# Patient Record
Sex: Male | Born: 1940 | Race: White | Hispanic: No | Smoking: Never smoker
Health system: Southern US, Community
[De-identification: ages and names within clinical notes are randomized; demographics above are authoritative.]

## PROBLEM LIST (undated history)

## (undated) DIAGNOSIS — I10 Essential (primary) hypertension: Secondary | ICD-10-CM

## (undated) DIAGNOSIS — W44F3XA Food entering into or through a natural orifice, initial encounter: Secondary | ICD-10-CM

## (undated) DIAGNOSIS — K259 Gastric ulcer, unspecified as acute or chronic, without hemorrhage or perforation: Secondary | ICD-10-CM

## (undated) DIAGNOSIS — T18128A Food in esophagus causing other injury, initial encounter: Secondary | ICD-10-CM

## (undated) DIAGNOSIS — M1990 Unspecified osteoarthritis, unspecified site: Secondary | ICD-10-CM

## (undated) HISTORY — DX: Gastric ulcer, unspecified as acute or chronic, without hemorrhage or perforation: K25.9

## (undated) HISTORY — DX: Food entering into or through a natural orifice, initial encounter: W44.F3XA

## (undated) HISTORY — PX: TOTAL HIP ARTHROPLASTY: SHX124

## (undated) HISTORY — PX: HERNIA REPAIR: SHX51

## (undated) HISTORY — DX: Food in esophagus causing other injury, initial encounter: T18.128A

---

## 2008-07-31 ENCOUNTER — Ambulatory Visit (HOSPITAL_COMMUNITY): Admission: RE | Admit: 2008-07-31 | Discharge: 2008-07-31 | Payer: Self-pay | Admitting: General Surgery

## 2010-09-08 LAB — CBC
HCT: 44.2 % (ref 39.0–52.0)
MCHC: 34.7 g/dL (ref 30.0–36.0)
MCV: 92.9 fL (ref 78.0–100.0)
Platelets: 238 10*3/uL (ref 150–400)
WBC: 6.2 10*3/uL (ref 4.0–10.5)

## 2010-09-08 LAB — COMPREHENSIVE METABOLIC PANEL
AST: 30 U/L (ref 0–37)
Albumin: 3.9 g/dL (ref 3.5–5.2)
BUN: 8 mg/dL (ref 6–23)
Calcium: 8.8 mg/dL (ref 8.4–10.5)
Chloride: 109 mEq/L (ref 96–112)
Creatinine, Ser: 1.01 mg/dL (ref 0.4–1.5)
GFR calc Af Amer: >60 mL/min (ref >60)
GFR calc non Af Amer: >60 mL/min (ref >60)
Total Bilirubin: 1.2 mg/dL (ref 0.3–1.2)

## 2010-09-08 LAB — DIFFERENTIAL
Basophils Absolute: 0 10*3/uL (ref 0.0–0.1)
Lymphocytes Relative: 26 % (ref 12–46)
Lymphs Abs: 1.6 10*3/uL (ref 0.7–4.0)
Monocytes Absolute: 0.4 10*3/uL (ref 0.1–1.0)
Neutro Abs: 4.1 10*3/uL (ref 1.7–7.7)

## 2010-10-11 NOTE — Op Note (Signed)
Mario Huerta, Mario Huerta               ACCOUNT NO.:  [id number]   MEDICAL RECORD NO.:  [id number]          PATIENT TYPE:  AMB   LOCATION:  DAY                          FACILITY:  [facility]   PHYSICIAN:  Juanetta Gosling, MDDATE OF BIRTH:  1941/05/16   DATE OF PROCEDURE:  07/31/2008  DATE OF DISCHARGE:                               OPERATIVE REPORT   PREOPERATIVE DIAGNOSIS:  Chronically incarcerated left inguinal hernia.   POSTOPERATIVE DIAGNOSIS:  Chronically incarcerated left inguinal hernia.   PROCEDURE:  Left inguinal repair with extended Prolene system mesh.   SURGEON:  Harden Mo, M.D.   ASSISTANT:  None.   ANESTHESIA:  General.   FINDINGS:  Chronically incarcerated scrotal left indirect hernia.   SPECIMENS:  None.   BLOOD LOSS:  50 mL.   COMPLICATIONS:  None.   DRAINS:  None.   DISPOSITION:  To recovery room in stable condition.   INDICATIONS:  Mr. Vondrak is a 59-year male with about a 6-year history  of an enlarging  left inguinal hernia with what he says are 2 episodes  where he has been reduced.  One of these is associated with vomiting.  It sounded like he had an obstruction at that time. I saw him in the  office and he had a very large chronically incarcerated left groin  hernia.  He and I discussed an open left inguinal hernia repair with  mesh.   PROCEDURE:  After informed consent was obtained, the patient was first  administered 1 gram of intravenous cefazolin.  He was then taken to the  operating room.  He was placed under general anesthesia without  complication.  Sequential compression devices were placed on his lower  extremities prior to operation.  His left groin was then prepped and  draped in a standard sterile surgical fashion.  A surgical time-out was  then performed.  About a 6 cm left groin incision was then made overlying where the very  large bulge that he had that was present.  This was dissected down to  the level of the external  oblique.  A superficial epigastric vein was  noted and this was ligated with 2-0 Vicryl suture.  The external  abdominal oblique was identified and incised with a knife.  This was  then entered through the external inguinal ring.  The external oblique  was then grasped with clamps and the cord was attempted to be  identified.  He had a very large chronically incarcerated hernia.  It  took about 45 minutes of work to separate this hernia sac from the  surrounding cord structures.  His vas deferens and remaining cord  structures were identified and preserved throughout the operation.  The  hernia sac was eventually freed from the scrotum as well as from his  remaining cord structures and this was able to be reduced back into his  abdomen.  I did open his hernia sac and there was bile present at the  proximal portion of the sac. I had ligated the portion overlying that.  Following this, this  was then reduced into the abdomen.  A sponge was  then placed in the preperitoneal space.  The floor was then was then  examined.  This was noted to be weak, but there was no direct hernia  that was present.  The external abdominal oblique was lifted laterally  to make space for the mesh.  The preperitoneal space was developed with  a [employer] sponge and extended Prolene hernia system mesh was then used.  The mesh was then placed into the preperitoneal space and the bottom  portion of the bilayer was laid flat.  Following this, I did close his  internal ring with a figure-of-eight 2-0 Vicryl suture to allow the mesh  to sit better.  The top portion of the bilayer was then laid flat.  A T  cut was made in the mesh and wrapped around the spermatic cord.  This  was then tacked together as well as to the inguinal ligament into  position.  A further 2-0 Prolene stitch was also placed to the inguinal  ligament inferiorly.  The 2-0 Prolene stitch was placed to the pubic  tubercle as well as to the internal  oblique superiorly.  The mesh was  then laid flat in the lateral position and the mesh appeared to lie in  good position.  There was some oozing from some muscle that I used  electrocautery to stop.  The entire cord were the dissection was needed  had various places that were oozing.  Hemostasis was obtained in all of  these positions, but there was a very large dissection space and  I did  lose approximately 50 mL of blood total with this operation due to the  chronicity of the sac.  Following this, I then closed the external  abdominal oblique with a 2-0 Vicryl, Scarpa's was closed with a 3-0  Vicryl, and the skin was closed with a 4-0 Monocryl in a subcuticular  fashion; 10 mL of quarter percent Marcaine was then infiltrated into the  wound as well as performing an ilioinguinal nerve block.  Steri-Strips  and sterile dressing were placed over his wound.  His left testicle was  present in his scrotum after the completion of the operation.  He was  extubated in the operating room having tolerated this well and  transferred to the recovery room.      Juanetta Gosling, MD  Electronically Signed     MCW/MEDQ  D:  07/31/2008  T:  07/31/2008  Job:  [id number]   cc:   Lucila Maine

## 2014-06-29 DIAGNOSIS — Z96641 Presence of right artificial hip joint: Secondary | ICD-10-CM | POA: Insufficient documentation

## 2015-10-01 DIAGNOSIS — Z9181 History of falling: Secondary | ICD-10-CM | POA: Diagnosis not present

## 2015-10-01 DIAGNOSIS — Z96641 Presence of right artificial hip joint: Secondary | ICD-10-CM | POA: Diagnosis not present

## 2015-10-01 DIAGNOSIS — Z1389 Encounter for screening for other disorder: Secondary | ICD-10-CM | POA: Diagnosis not present

## 2015-10-01 DIAGNOSIS — E782 Mixed hyperlipidemia: Secondary | ICD-10-CM | POA: Diagnosis not present

## 2015-10-01 DIAGNOSIS — I10 Essential (primary) hypertension: Secondary | ICD-10-CM | POA: Diagnosis not present

## 2015-10-01 DIAGNOSIS — Z79899 Other long term (current) drug therapy: Secondary | ICD-10-CM | POA: Diagnosis not present

## 2015-10-27 DIAGNOSIS — S335XXA Sprain of ligaments of lumbar spine, initial encounter: Secondary | ICD-10-CM | POA: Diagnosis not present

## 2015-10-29 DIAGNOSIS — Z885 Allergy status to narcotic agent status: Secondary | ICD-10-CM | POA: Diagnosis not present

## 2015-10-29 DIAGNOSIS — Z79899 Other long term (current) drug therapy: Secondary | ICD-10-CM | POA: Diagnosis not present

## 2015-10-29 DIAGNOSIS — M5137 Other intervertebral disc degeneration, lumbosacral region: Secondary | ICD-10-CM | POA: Diagnosis not present

## 2015-10-29 DIAGNOSIS — Z888 Allergy status to other drugs, medicaments and biological substances status: Secondary | ICD-10-CM | POA: Diagnosis not present

## 2015-10-29 DIAGNOSIS — G8929 Other chronic pain: Secondary | ICD-10-CM | POA: Diagnosis not present

## 2015-10-29 DIAGNOSIS — I10 Essential (primary) hypertension: Secondary | ICD-10-CM | POA: Diagnosis not present

## 2015-10-29 DIAGNOSIS — M545 Low back pain: Secondary | ICD-10-CM | POA: Diagnosis not present

## 2015-10-29 DIAGNOSIS — Z96641 Presence of right artificial hip joint: Secondary | ICD-10-CM | POA: Diagnosis not present

## 2015-10-29 DIAGNOSIS — M438X6 Other specified deforming dorsopathies, lumbar region: Secondary | ICD-10-CM | POA: Diagnosis not present

## 2015-10-29 DIAGNOSIS — M4317 Spondylolisthesis, lumbosacral region: Secondary | ICD-10-CM | POA: Diagnosis not present

## 2015-10-29 DIAGNOSIS — M5136 Other intervertebral disc degeneration, lumbar region: Secondary | ICD-10-CM | POA: Diagnosis not present

## 2016-12-28 DIAGNOSIS — Z1389 Encounter for screening for other disorder: Secondary | ICD-10-CM | POA: Diagnosis not present

## 2016-12-28 DIAGNOSIS — Z6828 Body mass index (BMI) 28.0-28.9, adult: Secondary | ICD-10-CM | POA: Diagnosis not present

## 2016-12-28 DIAGNOSIS — N529 Male erectile dysfunction, unspecified: Secondary | ICD-10-CM | POA: Diagnosis not present

## 2016-12-28 DIAGNOSIS — Z9181 History of falling: Secondary | ICD-10-CM | POA: Diagnosis not present

## 2017-05-29 HISTORY — PX: MOUTH SURGERY: SHX715

## 2018-01-22 DIAGNOSIS — Z0000 Encounter for general adult medical examination without abnormal findings: Secondary | ICD-10-CM | POA: Diagnosis not present

## 2018-01-22 DIAGNOSIS — E782 Mixed hyperlipidemia: Secondary | ICD-10-CM | POA: Diagnosis not present

## 2018-01-22 DIAGNOSIS — M48061 Spinal stenosis, lumbar region without neurogenic claudication: Secondary | ICD-10-CM | POA: Diagnosis not present

## 2018-01-22 DIAGNOSIS — M4716 Other spondylosis with myelopathy, lumbar region: Secondary | ICD-10-CM | POA: Diagnosis not present

## 2018-01-31 ENCOUNTER — Encounter (HOSPITAL_COMMUNITY)
Admission: EM | Disposition: A | Discharge status: Home / Self Care | Payer: Self-pay | Source: Home / Self Care | Attending: Emergency Medicine

## 2018-01-31 ENCOUNTER — Emergency Department (HOSPITAL_COMMUNITY): Payer: Medicare Other | Admitting: Certified Registered Nurse Anesthetist

## 2018-01-31 ENCOUNTER — Encounter (HOSPITAL_COMMUNITY): Payer: Self-pay | Admitting: Emergency Medicine

## 2018-01-31 ENCOUNTER — Ambulatory Visit (HOSPITAL_COMMUNITY)
Admission: EM | Admit: 2018-01-31 | Discharge: 2018-01-31 | Disposition: A | Discharge status: Home / Self Care | Payer: Medicare Other | Attending: Emergency Medicine | Admitting: Emergency Medicine

## 2018-01-31 ENCOUNTER — Other Ambulatory Visit: Payer: Self-pay

## 2018-01-31 ENCOUNTER — Emergency Department (HOSPITAL_COMMUNITY): Payer: Medicare Other

## 2018-01-31 DIAGNOSIS — Z79899 Other long term (current) drug therapy: Secondary | ICD-10-CM | POA: Diagnosis not present

## 2018-01-31 DIAGNOSIS — R1111 Vomiting without nausea: Secondary | ICD-10-CM

## 2018-01-31 DIAGNOSIS — X58XXXA Exposure to other specified factors, initial encounter: Secondary | ICD-10-CM | POA: Diagnosis not present

## 2018-01-31 DIAGNOSIS — I10 Essential (primary) hypertension: Secondary | ICD-10-CM | POA: Insufficient documentation

## 2018-01-31 DIAGNOSIS — Y92009 Unspecified place in unspecified non-institutional (private) residence as the place of occurrence of the external cause: Secondary | ICD-10-CM | POA: Insufficient documentation

## 2018-01-31 DIAGNOSIS — K222 Esophageal obstruction: Secondary | ICD-10-CM

## 2018-01-31 DIAGNOSIS — R1310 Dysphagia, unspecified: Secondary | ICD-10-CM | POA: Insufficient documentation

## 2018-01-31 DIAGNOSIS — T18128A Food in esophagus causing other injury, initial encounter: Secondary | ICD-10-CM | POA: Diagnosis not present

## 2018-01-31 DIAGNOSIS — T18120A Food in esophagus causing compression of trachea, initial encounter: Secondary | ICD-10-CM | POA: Diagnosis not present

## 2018-01-31 DIAGNOSIS — K2210 Ulcer of esophagus without bleeding: Secondary | ICD-10-CM | POA: Insufficient documentation

## 2018-01-31 DIAGNOSIS — K208 Other esophagitis: Secondary | ICD-10-CM | POA: Diagnosis not present

## 2018-01-31 HISTORY — PX: BIOPSY: SHX5522

## 2018-01-31 HISTORY — PX: FOREIGN BODY REMOVAL: SHX962

## 2018-01-31 HISTORY — DX: Unspecified osteoarthritis, unspecified site: M19.90

## 2018-01-31 HISTORY — DX: Essential (primary) hypertension: I10

## 2018-01-31 HISTORY — PX: ESOPHAGOGASTRODUODENOSCOPY: SHX5428

## 2018-01-31 LAB — CBC WITH DIFFERENTIAL/PLATELET
Abs Immature Granulocytes: 0 10*3/uL (ref 0.0–0.1)
Basophils Absolute: 0.1 10*3/uL (ref 0.0–0.1)
Basophils Relative: 1 %
Eosinophils Absolute: 0.1 10*3/uL (ref 0.0–0.7)
Eosinophils Relative: 2 %
HCT: 44.3 % (ref 39.0–52.0)
Hemoglobin: 15.3 g/dL (ref 13.0–17.0)
Immature Granulocytes: 0 %
Lymphocytes Relative: 24 %
Lymphs Abs: 1.5 10*3/uL (ref 0.7–4.0)
MCH: 32 pg (ref 26.0–34.0)
MCHC: 34.5 g/dL (ref 30.0–36.0)
MCV: 92.7 fL (ref 78.0–100.0)
Monocytes Absolute: 0.5 10*3/uL (ref 0.1–1.0)
Monocytes Relative: 9 %
Neutro Abs: 3.9 10*3/uL (ref 1.7–7.7)
Neutrophils Relative %: 64 %
Platelets: 217 10*3/uL (ref 150–400)
RBC: 4.78 MIL/uL (ref 4.22–5.81)
RDW: 13.1 % (ref 11.5–15.5)
WBC: 6.1 10*3/uL (ref 4.0–10.5)

## 2018-01-31 LAB — COMPREHENSIVE METABOLIC PANEL
ALT: 24 U/L (ref 0–44)
AST: 39 U/L (ref 15–41)
Albumin: 3.7 g/dL (ref 3.5–5.0)
Alkaline Phosphatase: 83 U/L (ref 38–126)
Anion gap: 14 (ref 5–15)
BUN: 17 mg/dL (ref 8–23)
CO2: 22 mmol/L (ref 22–32)
Calcium: 9.1 mg/dL (ref 8.9–10.3)
Chloride: 107 mmol/L (ref 98–111)
Creatinine, Ser: 0.98 mg/dL (ref 0.61–1.24)
GFR calc Af Amer: >60 mL/min (ref >60)
GFR calc non Af Amer: >60 mL/min (ref >60)
Glucose, Bld: 98 mg/dL (ref 70–99)
Potassium: 3.7 mmol/L (ref 3.5–5.1)
Sodium: 143 mmol/L (ref 135–145)
Total Bilirubin: 1.3 mg/dL — ABNORMAL HIGH (ref 0.3–1.2)
Total Protein: 6.5 g/dL (ref 6.5–8.1)

## 2018-01-31 SURGERY — ESOPHAGOGASTRODUODENOSCOPY (EGD) WITH PROPOFOL
Anesthesia: Monitor Anesthesia Care

## 2018-01-31 SURGERY — EGD (ESOPHAGOGASTRODUODENOSCOPY)
Anesthesia: General

## 2018-01-31 MED ORDER — SUCRALFATE 1 GM/10ML PO SUSP: 1.0000 g | Freq: Two times a day (BID) | ORAL | 1 refills | Status: DC

## 2018-01-31 MED ORDER — LIDOCAINE 2% (20 MG/ML) 5 ML SYRINGE
INTRAMUSCULAR | Status: DC | PRN
  Administered 2018-01-31: 50 mg via INTRAVENOUS

## 2018-01-31 MED ORDER — GLUCAGON HCL RDNA (DIAGNOSTIC) 1 MG IJ SOLR
2.0000 mg | Freq: Once | INTRAMUSCULAR | Status: AC
  Administered 2018-01-31: 2 mg via INTRAVENOUS
  Filled 2018-01-31: qty 2

## 2018-01-31 MED ORDER — ONDANSETRON HCL 4 MG/2ML IJ SOLN
INTRAMUSCULAR | Status: DC | PRN
  Administered 2018-01-31: 4 mg via INTRAVENOUS

## 2018-01-31 MED ORDER — LACTATED RINGERS IV SOLN
INTRAVENOUS | Status: DC
  Administered 2018-01-31: 16:00:00 via INTRAVENOUS

## 2018-01-31 MED ORDER — PROPOFOL 10 MG/ML IV BOLUS
INTRAVENOUS | Status: DC | PRN
  Administered 2018-01-31: 130 mg via INTRAVENOUS

## 2018-01-31 MED ORDER — FENTANYL CITRATE (PF) 100 MCG/2ML IJ SOLN
INTRAMUSCULAR | Status: AC
  Filled 2018-01-31: qty 4

## 2018-01-31 MED ORDER — SUCCINYLCHOLINE CHLORIDE 200 MG/10ML IV SOSY
PREFILLED_SYRINGE | INTRAVENOUS | Status: DC | PRN
  Administered 2018-01-31: 70 mg via INTRAVENOUS

## 2018-01-31 MED ORDER — PANTOPRAZOLE SODIUM 40 MG PO TBEC: 40.0000 mg | DELAYED_RELEASE_TABLET | Freq: Two times a day (BID) | ORAL | 1 refills | Status: DC

## 2018-01-31 MED ORDER — FENTANYL CITRATE (PF) 100 MCG/2ML IJ SOLN
INTRAMUSCULAR | Status: DC | PRN
  Administered 2018-01-31: 50 ug via INTRAVENOUS

## 2018-01-31 MED ORDER — SODIUM CHLORIDE 0.9 % IV SOLN: INTRAVENOUS | Status: DC

## 2018-01-31 MED ORDER — MIDAZOLAM HCL 5 MG/ML IJ SOLN
INTRAMUSCULAR | Status: AC
  Filled 2018-01-31: qty 3

## 2018-01-31 NOTE — Anesthesia Postprocedure Evaluation (Signed)
Anesthesia Post Note  Patient: Mario Huerta  Procedure(s) Performed: ESOPHAGOGASTRODUODENOSCOPY (EGD) (N/A ) BIOPSY     Patient location during evaluation: PACU Anesthesia Type: General Level of consciousness: awake and alert Pain management: pain level controlled Vital Signs Assessment: post-procedure vital signs reviewed and stable Respiratory status: spontaneous breathing, nonlabored ventilation, respiratory function stable and patient connected to nasal cannula oxygen Cardiovascular status: blood pressure returned to baseline and stable Postop Assessment: no apparent nausea or vomiting Anesthetic complications: no    Last Vitals:  Vitals:   01/31/18 1710 01/31/18 1756  BP: (!) 160/98 (!) 161/100  Pulse: 77 72  Resp: (!) 27 20  Temp:    SpO2: 96% 96%    Last Pain:  Vitals:   01/31/18 1757  TempSrc:   PainSc: 0-No pain                 Effie Berkshire

## 2018-01-31 NOTE — ED Provider Notes (Signed)
[facility] EMERGENCY DEPARTMENT Provider Note   CSN: [id number] Arrival date & time: 01/31/18  1194     History   Chief Complaint Chief Complaint  Patient presents with  . Dysphagia    food bolus    HPI Mario Huerta is a 77 y.o. male with a PMHx significant for Arthritis and HTN who presents for evaluation of a possible food bolus. States he has new dentures and is having a difficult time chewing his food. Last evening he was eating a steak and felt it get stuck on his esophagus. States he has been unable to eat anything since the incident. Is able to tolerate small sips of liquids however cannot tolerate full mouth fulls of liquids without vomiting. Denies chest pain, SOB, abdominal pain, nausea, constipation, diarrhea, fever, chills. States he thinks he vomited his BP meds this morning.    Past Medical History:  Diagnosis Date  . Arthritis   . Hypertension     There are no active problems to display for this patient.   History reviewed. No pertinent surgical history.     Home Medications    Prior to Admission medications   Medication Sig Start Date End Date Taking? Authorizing Provider  amLODipine (NORVASC) 5 MG tablet Take 5 mg by mouth daily. 01/22/18  Yes [provider]  aspirin EC 81 MG tablet Take 81 mg by mouth daily.   Yes [provider]  Glucosamine-Chondroitin-Ca-D3 (TRIPLE FLEX 50+ PO) Take 1 tablet by mouth daily.   Yes [provider]  lisinopril-hydrochlorothiazide (PRINZIDE,ZESTORETIC) 20-12.5 MG tablet Take 1 tablet by mouth daily. 01/22/18  Yes [provider]  Omega-3 Fatty Acids (FISH OIL) 1000 MG CAPS Take 1,000 mg by mouth daily.   Yes [provider]  pantoprazole (PROTONIX) 40 MG tablet Take 1 tablet (40 mg total) by mouth 2 (two) times daily for 28 days. 01/31/18 02/28/18  Ronnette Juniper, MD  sucralfate (CARAFATE) 1 GM/10ML suspension Take 10 mLs (1 g total) by mouth 2 (two) times daily  for 14 days. 01/31/18 02/14/18  Ronnette Juniper, MD    Family History History reviewed. No pertinent family history.  Social History Social History   Tobacco Use  . Smoking status: Never Smoker  Substance Use Topics  . Alcohol use: Yes    Comment: occasional  . Drug use: Never     Allergies   Codeine; Hydrocodone; Oxycodone; and Tramadol   Review of Systems Review of Systems Review of systems negative unless otherwise stated in the HPI.  Physical Exam Updated Vital Signs BP (!) 160/98   Pulse 77   Temp 98.6 F (37 C) (Oral)   Resp (!) 27   Ht 6\' 1"  (1.854 m)   Wt 95.3 kg   SpO2 96%   BMI 27.71 kg/m   Physical Exam  Constitutional: He appears well-developed and well-nourished. No distress.  HENT:  Head: Atraumatic.  Eyes: Pupils are equal, round, and reactive to light.  Neck: Normal range of motion. Neck supple.  Cardiovascular: Normal rate, regular rhythm, normal heart sounds and intact distal pulses. Exam reveals no gallop and no friction rub.  No murmur heard. Pulmonary/Chest: Effort normal and breath sounds normal. No respiratory distress.  Abdominal: Soft. He exhibits no distension.  Musculoskeletal: Normal range of motion.  Neurological: He is alert.  Skin: Skin is warm and dry. He is not diaphoretic.  Psychiatric: He has a normal mood and affect.  Nursing note and vitals reviewed.  ED Treatments / Results  Labs (all labs ordered are listed, but only abnormal results are displayed) Labs Reviewed  COMPREHENSIVE METABOLIC PANEL - Abnormal; Notable for the following components:      Result Value   Total Bilirubin 1.3 (*)    All other components within normal limits  CBC WITH DIFFERENTIAL/PLATELET  SURGICAL PATHOLOGY    EKG None  Radiology Dg Chest 2 View  Result Date: 01/31/2018 CLINICAL DATA:  Pt states he got a piece of steak stuck in his throat last night. Pt states he got new teeth and isnt used to them yet. Pt denies cp and sob. EXAM: CHEST  - 2 VIEW COMPARISON:  Chest x-ray dated 07/31/2008. FINDINGS: Heart size and mediastinal contours are within normal limits. No radiodense foreign body within the mediastinum or upper abdomen. Lungs are clear. Osseous structures about the chest are unremarkable. IMPRESSION: No acute findings. No radiodense foreign body seen within the mediastinum or upper abdomen. Electronically Signed   By: Franki Cabot M.D.   On: 01/31/2018 13:26    Procedures Procedures (including critical care time)  Medications Ordered in ED Medications  glucagon (human recombinant) (GLUCAGEN) injection 2 mg (2 mg Intravenous Given 01/31/18 1329)  17   Initial Impression / Assessment and Plan / ED Course  I have reviewed the triage vital signs and the nursing notes as well as the past medical history.  Pertinent labs & imaging results that were available during my care of the patient were reviewed by me and considered in my medical decision making (see chart for details).  77 year old presents for evaluation of food bolus. Heart and Lungs clear. No signs of respiratory distress. Easy work of breathing. Will obtain labs and chest xray to evaluate. Will give Glugacon IV to attend at dislodging food.  1415: Nursing informed that patient has had multiple episodes of liquid emesis however no food products.   1426: Will consult with GI for evaluation.  Consulted with Dr. Therisa Doyne from [facility] who agrees to evaluate patient.  Dr. Therisa Doyne to take patient to endoscopy lab for EGD. See Note.  1735: Patient return from endoscopy without any immediate complications. Work of breathing non labored, no dysphagia. Without complaints. Able to Tolerate PO intake. Discussed return precaution with patient. Patient to follow-up with [facility] in office. VSS at discharge. Patient and family voiced understanding.    Final Clinical Impressions(s) / ED Diagnoses   Final diagnoses:  Non-intractable vomiting without nausea, unspecified vomiting  type  Esophageal obstruction due to food impaction    ED Discharge Orders         Ordered    pantoprazole (PROTONIX) 40 MG tablet  2 times daily     01/31/18 1703    sucralfate (CARAFATE) 1 GM/10ML suspension  2 times daily     01/31/18 1703           Henderly, Britni A, PA-C 01/31/18 1744    Maudie Flakes, MD 01/31/18 2314

## 2018-01-31 NOTE — Anesthesia Preprocedure Evaluation (Addendum)
Anesthesia Evaluation  Patient identified by MRN, date of birth, ID band Patient awake    Reviewed: Allergy & Precautions, NPO status , Patient's Chart, lab work & pertinent test results  Airway Mallampati: I  TM Distance: >3 FB Neck ROM: Full    Dental  (+) Upper Dentures, Partial Lower, Dental Advisory Given   Pulmonary neg pulmonary ROS,    breath sounds clear to auscultation       Cardiovascular hypertension, Pt. on medications  Rhythm:Regular Rate:Normal     Neuro/Psych negative neurological ROS     GI/Hepatic negative GI ROS, Neg liver ROS,   Endo/Other  negative endocrine ROS  Renal/GU negative Renal ROS     Musculoskeletal   Abdominal Normal abdominal exam  (+)   Peds  Hematology negative hematology ROS (+)   Anesthesia Other Findings   Reproductive/Obstetrics                            Anesthesia Physical Anesthesia Plan  ASA: II  Anesthesia Plan: General   Post-op Pain Management:    Induction: Intravenous  PONV Risk Score and Plan: 2 and Ondansetron and Treatment may vary due to age or medical condition  Airway Management Planned: Oral ETT  Additional Equipment: None  Intra-op Plan:   Post-operative Plan: Extubation in OR  Informed Consent: I have reviewed the patients History and Physical, chart, labs and discussed the procedure including the risks, benefits and alternatives for the proposed anesthesia with the patient or authorized representative who has indicated his/her understanding and acceptance.   Dental advisory given  Plan Discussed with: CRNA  Anesthesia Plan Comments:        Anesthesia Quick Evaluation

## 2018-01-31 NOTE — ED Triage Notes (Addendum)
Pt states he was eating steak last night and now feels like a piece is stuck, was unable to keep meds down this morning-vomited after taking them, [facility]. resp e/u, nad. Previous hx of similar event but pts family member states he was able to get food down in the past without intervention, however this time he cannot.

## 2018-01-31 NOTE — ED Notes (Signed)
Patient transported to X-ray 

## 2018-01-31 NOTE — Op Note (Signed)
[facility] Patient Name: Mario Huerta Procedure Date : 01/31/2018 MRN: [id number] Attending MD: Ronnette Juniper , MD Date of Birth: Nov 17, 1940 CSN: [id number] Age: 77 Admit Type: Inpatient Procedure:                Upper GI endoscopy Indications:              Dysphagia, Foreign body in the esophagus(food bolus                            impaction) Providers:                Ronnette Juniper, MD, Laurell Roof,                            Technician Referring MD:              Medicines:                Monitored Anesthesia Care Complications:            No immediate complications. Estimated blood loss:                            None. Estimated Blood Loss:     Estimated blood loss was minimal. Procedure:                Pre-Anesthesia Assessment:                           - Prior to the procedure, a History and Physical                            was performed, and patient medications and                            allergies were reviewed. The patient's tolerance of                            previous anesthesia was also reviewed. The risks                            and benefits of the procedure and the sedation                            options and risks were discussed with the patient.                            All questions were answered, and informed consent                            was obtained. Prior Anticoagulants: The patient has                            taken aspirin, last dose was 1 day prior to                            procedure. ASA Grade  Assessment: II - A patient                            with mild systemic disease. After reviewing the                            risks and benefits, the patient was deemed in                            satisfactory condition to undergo the procedure.                           After obtaining informed consent, the endoscope was                            passed under direct vision. Throughout the            procedure, the patient's blood pressure, pulse, and                            oxygen saturations were monitored continuously. The                            GIF-H190 ([id number]) Olympus Adult EGD was introduced                            through the mouth, and advanced to the second part                            of duodenum. The upper GI endoscopy was                            accomplished without difficulty. The patient                            tolerated the procedure well. Scope In: Scope Out: Findings:      Food/meat was found in the lower third of the esophagus at 35 cm from       incisors. It was readily pushed in to the gastric cavity.      Few superficial esophageal ulcers with no bleeding and no stigmata of       recent bleeding were found 37 to 40 cm from the incisors. Biopsies were       taken with a cold forceps for histology.      LA Grade C (one or more mucosal breaks continuous between tops of 2 or       more mucosal folds, less than 75% circumference) esophagitis with no       bleeding was found 37 to 40 cm from the incisors.      One benign-appearing, intrinsic moderate (circumferential scarring or       stenosis; an endoscope may pass) stenosis was found 38 to 40 cm from the       incisors. The stenosis was traversed but dilation was not performed due       to presence of esophageal ulcers and esophagitis.      Localized mildly erythematous  mucosa without bleeding was found in the       gastric antrum.      The cardia and gastric fundus were normal on retroflexion.      The examined duodenum was normal. Impression:               - Food in the lower third of the esophagus.                           - Non-bleeding esophageal ulcers. Biopsied.                           - LA Grade C erosive esophagitis.                           - Benign-appearing esophageal stenosis.                           - Erythematous mucosa in the antrum.                           -  Normal examined duodenum. Moderate Sedation:      Patient did not receive moderate sedation for this procedure, but       instead received monitored anesthesia care. Recommendation:           - Patient has a contact number available for                            emergencies. The signs and symptoms of potential                            delayed complications were discussed with the                            patient. Return to normal activities tomorrow.                            Written discharge instructions were provided to the                            patient.                           - Mechanical soft diet.                           - Use Protonix (pantoprazole) 40 mg PO BID for 4                            weeks.                           - Use sucralfate suspension 1 gram PO BID for 2                            weeks.                           -  Await pathology results.                           - Repeat upper endoscopy for elective dilation of                            esophageal stricture at appointment to be scheduled                            for retreatment, hopefully in the next 4-6 weeks to                            allow adequate time for healing of ulcers and                            esophagitis. Procedure Code(s):        --- Professional ---                           [phone], Esophagogastroduodenoscopy, flexible,                            transoral; with biopsy, single or multiple Diagnosis Code(s):        --- Professional ---                           [phone], Food in esophagus causing other injury,                            initial encounter                           K22.10, Ulcer of esophagus without bleeding                           K20.8, Other esophagitis                           K22.2, Esophageal obstruction                           K31.89, Other diseases of stomach and duodenum                           R13.10, Dysphagia, unspecified                            T18.108A, Unspecified foreign body in esophagus                            causing other injury, initial encounter CPT copyright 2017 [employer]. All rights reserved. The codes documented in this report are preliminary and upon coder review may  be revised to meet current compliance requirements. Ronnette Juniper, MD 01/31/2018 4:57:29 PM This report has been signed electronically. Number of Addenda: 0

## 2018-01-31 NOTE — ED Notes (Signed)
Patient verbalizes understanding of discharge instructions. Opportunity for questioning and answers were provided. 

## 2018-01-31 NOTE — Brief Op Note (Signed)
01/31/2018  4:57 PM  PATIENT:  Mario Huerta  77 y.o. male  PRE-OPERATIVE DIAGNOSIS:  food impaction  POST-OPERATIVE DIAGNOSIS:  * No post-op diagnosis entered *  PROCEDURE:  Procedure(s): ESOPHAGOGASTRODUODENOSCOPY (EGD) (N/A)  SURGEON:  Surgeon(s) and Role:    Ronnette Juniper, MD - Primary  PHYSICIAN ASSISTANT:   ASSISTANTS: Yates Decamp, TEch  ANESTHESIA:   MAC  EBL:  Minimal   BLOOD ADMINISTERED:none  DRAINS: none   LOCAL MEDICATIONS USED:  NONE  SPECIMEN:  Biopsy / Limited Resection  DISPOSITION OF SPECIMEN:  PATHOLOGY  COUNTS:  YES  TOURNIQUET:  * No tourniquets in log *  DICTATION: .Dragon Dictation  PLAN OF CARE: Discharge to home after PACU  PATIENT DISPOSITION:  PACU - hemodynamically stable.   Delay start of Pharmacological VTE agent (>24hrs) due to surgical blood loss or risk of bleeding: no

## 2018-01-31 NOTE — Discharge Instructions (Addendum)
The results of the endoscopy showed a stricture and and ulcer. They were able to push the food into your stomach. Please follow-up with [facility] listed on your discharge packet. Please keep someone with you for the next few hours.  Please return to the Ed for any new or worsening symptoms such as chest pain, shortness of breath, coughing up blood, blood in your stool, abdominal pain.

## 2018-01-31 NOTE — H&P (Signed)
Mario Huerta is an 77 y.o. male.   Chief Complaint: esophageal food bolus impaction HPI: 77 year old male was in his usual state of health until yesterday evening when he felt a piece of steak lodged in his upper mid chest around 9 PM. He has not been able to eat or drink since then. He denies difficulty breathing or drooling. He recently got new dentures and had been avoiding red meat for several months until yesterday evening.For the last several months he has had history of difficulty swallowing solids more than liquids which is non progressive, infrequent not associated with weight loss. He denies heartburn, acid reflux or pain on swallowing. No prior EGD. He has regular bowel movements and denies blood in stool, black stools or change in bowel habits. No prior colonoscopy. No unintentional weight loss or loss of appetite. He takes a baby aspirin, last dose yesterday.   Past Medical History:  Diagnosis Date  . Arthritis   . Hypertension     History reviewed. No pertinent surgical history.  History reviewed. No pertinent family history. Social History:  reports that he has never smoked. He does not have any smokeless tobacco history on file. He reports that he drinks alcohol. He reports that he does not use drugs.  Allergies:  Allergies  Allergen Reactions  . Codeine Itching and Nausea And Vomiting  . Hydrocodone Nausea And Vomiting  . Oxycodone Nausea And Vomiting  . Tramadol Itching and Nausea And Vomiting     (Not in a hospital admission)  Results for orders placed or performed during the hospital encounter of 01/31/18 (from the past 48 hour(s))  CBC with Differential     Status: None   Collection Time: 01/31/18 12:54 PM  Result Value Ref Range   WBC 6.1 4.0 - 10.5 K/uL   RBC 4.78 4.22 - 5.81 MIL/uL   Hemoglobin 15.3 13.0 - 17.0 g/dL   HCT 44.3 39.0 - 52.0 %   MCV 92.7 78.0 - 100.0 fL   MCH 32.0 26.0 - 34.0 pg   MCHC 34.5 30.0 - 36.0 g/dL   RDW 13.1 11.5 - 15.5 %    Platelets 217 150 - 400 K/uL   Neutrophils Relative % 64 %   Neutro Abs 3.9 1.7 - 7.7 K/uL   Lymphocytes Relative 24 %   Lymphs Abs 1.5 0.7 - 4.0 K/uL   Monocytes Relative 9 %   Monocytes Absolute 0.5 0.1 - 1.0 K/uL   Eosinophils Relative 2 %   Eosinophils Absolute 0.1 0.0 - 0.7 K/uL   Basophils Relative 1 %   Basophils Absolute 0.1 0.0 - 0.1 K/uL   Immature Granulocytes 0 %   Abs Immature Granulocytes 0.0 0.0 - 0.1 K/uL    Comment: Performed at [facility], [address], [place], [place] [address]  Comprehensive metabolic panel     Status: Abnormal   Collection Time: 01/31/18 12:54 PM  Result Value Ref Range   Sodium 143 135 - 145 mmol/L   Potassium 3.7 3.5 - 5.1 mmol/L   Chloride 107 98 - 111 mmol/L   CO2 22 22 - 32 mmol/L   Glucose, Bld 98 70 - 99 mg/dL   BUN 17 8 - 23 mg/dL   Creatinine, Ser 0.98 0.61 - 1.24 mg/dL   Calcium 9.1 8.9 - 10.3 mg/dL   Total Protein 6.5 6.5 - 8.1 g/dL   Albumin 3.7 3.5 - 5.0 g/dL   AST 39 15 - 41 U/L   ALT 24  0 - 44 U/L   Alkaline Phosphatase 83 38 - 126 U/L   Total Bilirubin 1.3 (H) 0.3 - 1.2 mg/dL   GFR calc non Af Amer >60 >60 mL/min   GFR calc Af Amer >60 >60 mL/min    Comment: (NOTE) The eGFR has been calculated using the CKD EPI equation. This calculation has not been validated in all clinical situations. eGFR's persistently <60 mL/min signify possible Chronic Kidney Disease.    Anion gap 14 5 - 15    Comment: Performed at [facility] [address], [place], [place] [address]   Dg Chest 2 View  Result Date: 01/31/2018 CLINICAL DATA:  Pt states he got a piece of steak stuck in his throat last night. Pt states he got new teeth and isnt used to them yet. Pt denies cp and sob. EXAM: CHEST - 2 VIEW COMPARISON:  Chest x-ray dated 07/31/2008. FINDINGS: Heart size and mediastinal contours are within normal limits. No radiodense foreign body within the mediastinum or upper abdomen. Lungs are clear. Osseous structures about  the chest are unremarkable. IMPRESSION: No acute findings. No radiodense foreign body seen within the mediastinum or upper abdomen. Electronically Signed   By: Franki Cabot M.D.   On: 01/31/2018 13:26    Review of Systems  Constitutional: Negative.   HENT: Negative.   Eyes: Negative.   Respiratory: Negative.   Cardiovascular: Positive for chest pain.  Gastrointestinal: Negative.   Musculoskeletal: Positive for back pain.  Skin: Negative.   Neurological: Negative.   Endo/Heme/Allergies: Negative.   Psychiatric/Behavioral: Negative.     Blood pressure (!) 159/103, pulse 65, temperature 98.3 F (36.8 C), temperature source Oral, resp. rate 16, height _0  (1.854 m), weight 95.3 kg, SpO2 95 %. Physical Exam  Constitutional: He is oriented to person, place, and time. He appears well-developed and well-nourished.  HENT:  Head: Normocephalic and atraumatic.  Eyes: Conjunctivae are normal.  Neck: Neck supple.  Cardiovascular: Normal rate, regular rhythm and normal heart sounds.  Respiratory: Effort normal and breath sounds normal.  GI: Soft.  Musculoskeletal: Normal range of motion.  Neurological: He is alert and oriented to person, place, and time.  Skin: Skin is warm.  Psychiatric: He has a normal mood and affect. His behavior is normal.      Assessment/Plan Esophageal food bolus impaction since 9 PM yesterday-likley steak that he had for dinner. History of non progressive dysphagia to solids more than liquids for several months, possibility of esophageal stricture. Chest x-ray did not show a foreign body in chest or upper abdomen. Labs unremarkable and vital stable.  Plan bedside EGD for food bolus disimpaction/removal. The risks(bleeding, perforation, infection, requirement for surgery) and benefits of the procedure were discussed with the patient in details. He understands and verbalizes consent  Ronnette Juniper, MD 01/31/2018, 3:27 PM

## 2018-01-31 NOTE — Transfer of Care (Signed)
Immediate Anesthesia Transfer of Care Note  Patient: Mario Huerta  Procedure(s) Performed: ESOPHAGOGASTRODUODENOSCOPY (EGD) (N/A ) BIOPSY  Patient Location: Endoscopy Unit  Anesthesia Type:General  Level of Consciousness: awake, alert , oriented and patient cooperative  Airway & Oxygen Therapy: Patient Spontanous Breathing and Patient connected to face mask oxygen  Post-op Assessment: Report given to RN and Post -op Vital signs reviewed and stable  Post vital signs: Reviewed and stable  Last Vitals:  Vitals Value Taken Time  BP 161/100 01/31/2018  5:56 PM  Temp    Pulse 72 01/31/2018  5:56 PM  Resp 20 01/31/2018  5:56 PM  SpO2 96 % 01/31/2018  5:56 PM    Last Pain:  Vitals:   01/31/18 1757  TempSrc:   PainSc: 0-No pain         Complications: No apparent anesthesia complications

## 2018-01-31 NOTE — Anesthesia Procedure Notes (Signed)
Procedure Name: Intubation Date/Time: 01/31/2018 4:38 PM Performed by: Genelle Bal, CRNA Pre-anesthesia Checklist: Patient identified, Emergency Drugs available, Suction available and Patient being monitored Patient Re-evaluated:Patient Re-evaluated prior to induction Oxygen Delivery Method: Circle system utilized Preoxygenation: Pre-oxygenation with 100% oxygen Induction Type: IV induction, Rapid sequence and Cricoid Pressure applied Laryngoscope Size: Miller and 2 Grade View: Grade I Tube type: Oral Tube size: 7.5 mm Number of attempts: 1 Airway Equipment and Method: Stylet Placement Confirmation: ETT inserted through vocal cords under direct vision,  positive ETCO2 and breath sounds checked- equal and bilateral Secured at: 21 cm Tube secured with: Tape Dental Injury: Teeth and Oropharynx as per pre-operative assessment

## 2018-01-31 NOTE — Op Note (Signed)
EGD was performed for a history of dysphagia and food bolus impaction.  Findings: Food bolus/meat was found in the lower third of esophagus at 35 cm from incisors. It was readily pushed into the gastric cavity. Few superficial esophageal ulcers with no bleeding was found from 37-40 cm from incisors, biopsies taken with forceps for histology. LA grade C esophagitis noted from 37-40 cm from incisors. 1 benign-appearing, intrinsic moderate stenosis found from 38-40 cm from incisors. The stenosis was traversed but dilatation was not performed due to presence of esophageal ulcers and esophagitis. Mildly erythematous mucosa found in the antrum, cardia and fundus appeared unremarkable and retroflexion, normal-appearing duodenal bulb and duodenum.   Recommendations: Mechanical soft diet. PPI twice a day for 4 weeks Sucralfate suspension 1 g by mouth twice a day for 2 weeks Plan repeat endoscopy for elective dilatation of esophageal stricture at an appointment to be scheduled for treatment, hopefully in the next 4-6 weeks to allow adequate time for healing of ulcers and esophagitis. Biopsies to be followed as an outpatient.  Ronnette Juniper, M.D.

## 2018-01-31 NOTE — ED Notes (Signed)
Pt unable to tolerate po intake. Medication did not help to move food bolus. Pt in NAD.

## 2018-01-31 NOTE — ED Notes (Signed)
ED Provider at bedside. 

## 2018-02-04 ENCOUNTER — Encounter (HOSPITAL_COMMUNITY): Payer: Self-pay | Admitting: Gastroenterology

## 2018-02-04 DIAGNOSIS — Z1211 Encounter for screening for malignant neoplasm of colon: Secondary | ICD-10-CM | POA: Diagnosis not present

## 2018-02-12 ENCOUNTER — Encounter (HOSPITAL_COMMUNITY): Payer: Self-pay | Admitting: Emergency Medicine

## 2018-02-12 ENCOUNTER — Telehealth: Payer: Self-pay | Admitting: Gastroenterology

## 2018-02-12 ENCOUNTER — Other Ambulatory Visit: Payer: Self-pay

## 2018-02-12 ENCOUNTER — Emergency Department (HOSPITAL_COMMUNITY)
Admission: EM | Admit: 2018-02-12 | Discharge: 2018-02-12 | Disposition: A | Discharge status: Home / Self Care | Payer: Medicare Other | Attending: Emergency Medicine | Admitting: Emergency Medicine

## 2018-02-12 DIAGNOSIS — Z79899 Other long term (current) drug therapy: Secondary | ICD-10-CM | POA: Diagnosis not present

## 2018-02-12 DIAGNOSIS — Z7982 Long term (current) use of aspirin: Secondary | ICD-10-CM | POA: Insufficient documentation

## 2018-02-12 DIAGNOSIS — R0989 Other specified symptoms and signs involving the circulatory and respiratory systems: Secondary | ICD-10-CM | POA: Diagnosis not present

## 2018-02-12 DIAGNOSIS — I10 Essential (primary) hypertension: Secondary | ICD-10-CM | POA: Diagnosis not present

## 2018-02-12 DIAGNOSIS — T18128A Food in esophagus causing other injury, initial encounter: Secondary | ICD-10-CM

## 2018-02-12 LAB — CBC WITH DIFFERENTIAL/PLATELET
Abs Immature Granulocytes: 0 10*3/uL (ref 0.0–0.1)
BASOS ABS: 0.1 10*3/uL (ref 0.0–0.1)
BASOS PCT: 1 %
Eosinophils Absolute: 0.2 10*3/uL (ref 0.0–0.7)
Eosinophils Relative: 3 %
HCT: 42.8 % (ref 39.0–52.0)
Hemoglobin: 14.8 g/dL (ref 13.0–17.0)
IMMATURE GRANULOCYTES: 0 %
Lymphocytes Relative: 23 %
Lymphs Abs: 1.3 10*3/uL (ref 0.7–4.0)
MCH: 31.8 pg (ref 26.0–34.0)
MCHC: 34.6 g/dL (ref 30.0–36.0)
MCV: 92 fL (ref 78.0–100.0)
MONOS PCT: 8 %
Monocytes Absolute: 0.5 10*3/uL (ref 0.1–1.0)
NEUTROS ABS: 3.8 10*3/uL (ref 1.7–7.7)
NEUTROS PCT: 65 %
PLATELETS: 227 10*3/uL (ref 150–400)
RBC: 4.65 MIL/uL (ref 4.22–5.81)
RDW: 12.9 % (ref 11.5–15.5)
WBC: 5.8 10*3/uL (ref 4.0–10.5)

## 2018-02-12 LAB — COMPREHENSIVE METABOLIC PANEL
ALBUMIN: 3.6 g/dL (ref 3.5–5.0)
ALT: 22 U/L (ref 0–44)
ANION GAP: 11 (ref 5–15)
AST: 32 U/L (ref 15–41)
Alkaline Phosphatase: 76 U/L (ref 38–126)
BUN: 12 mg/dL (ref 8–23)
CALCIUM: 9.1 mg/dL (ref 8.9–10.3)
CHLORIDE: 107 mmol/L (ref 98–111)
CO2: 23 mmol/L (ref 22–32)
CREATININE: 1.16 mg/dL (ref 0.61–1.24)
GFR calc non Af Amer: 58 mL/min — ABNORMAL LOW (ref >60)
Glucose, Bld: 110 mg/dL — ABNORMAL HIGH (ref 70–99)
POTASSIUM: 3.6 mmol/L (ref 3.5–5.1)
SODIUM: 141 mmol/L (ref 135–145)
TOTAL PROTEIN: 6.5 g/dL (ref 6.5–8.1)
Total Bilirubin: 1.1 mg/dL (ref 0.3–1.2)

## 2018-02-12 NOTE — ED Triage Notes (Signed)
Pt presents to ED for assessment of a food bolus since 12pm.  States he was eating chicken and he felt it get stuck.  Seen for same with steak 2 weeks ago.  States he is not a candidate for esophageal stretch at this time due to stomach acid.  Patient denies drooling, states he tried to drink some water but threw it back up.  Denies SOB.

## 2018-02-12 NOTE — ED Provider Notes (Signed)
MSE was initiated and I personally evaluated the patient and placed orders (if any) at  1:50 PM on February 12, 2018.  The patient appears stable so that the remainder of the MSE may be completed by another provider.  Patient placed in Quick Look pathway, seen and evaluated   Chief Complaint: food bolus  HPI:   77yo male presents for possible food bolus. He was eating chicken for lunch when he felt a piece of chicken lodge in his throat. States that it feels like it is at the level of his neck. This is similar to sensation experienced on 9/5. This is only the second time he has had a food bolus. Endoscopy was done at that time but unable to dilate because they found ulcers. He is able to swallow saliva but unable to keep liquids down. Denies vomiting, shortness of breath or chest pain.  ROS: food bolus  Physical Exam:   Gen: No distress  Neuro: Awake and Alert  Skin: Warm    Focused Exam: RRR. Lungs CTAB. No drooling noted.   Initiation of care has begun. The patient has been counseled on the process, plan, and necessity for staying for the completion/evaluation, and the remainder of the medical screening examination    Delia Heady, PA-C 02/12/18 1352    Sherwood Gambler, MD 02/12/18 [id number]

## 2018-02-12 NOTE — ED Notes (Signed)
Pt arrives to nurse first desk, no sob, speaking in full sentences

## 2018-02-12 NOTE — Discharge Instructions (Addendum)
Please have soft diet and close follow up with gi Dr. Encarnacion Slates office is aware you were here. Please contact to have reevaluation Return if you are worse at any time

## 2018-02-12 NOTE — ED Notes (Signed)
Pt was able to drink a cup of water without any difficulty.  Pt states that he feels that the food bolus was dislodged and asked for a soda.  Pt was able to drink this also without any difficulty

## 2018-02-12 NOTE — ED Provider Notes (Signed)
[facility] EMERGENCY DEPARTMENT Provider Note   CSN: [id number] Arrival date & time: 02/12/18  1335     History   Chief Complaint Chief Complaint  Patient presents with  . Food Bolus    HPI Mario Huerta is a 77 y.o. male.  HPI  77 year old male history of recent food impaction that required endoscopy presents today with similar symptoms the patient states that he was eating lunch at approximately noon he had teriyaki chicken.  He then felt that it was stuck in his throat.  He has been unable to swallow liquids since that time.  He states that he was treated by Dr.Karki and was unable to be dilated due to ulcers.  He was started on  Protonix and is scheduled for follow up.  He denies any other problems at this time.  Past Medical History:  Diagnosis Date  . Arthritis   . Hypertension     There are no active problems to display for this patient.   Past Surgical History:  Procedure Laterality Date  . BIOPSY  01/31/2018   Procedure: BIOPSY;  Surgeon: Ronnette Juniper, MD;  Location: [facility] ENDOSCOPY;  Service: Gastroenterology;;  . ESOPHAGOGASTRODUODENOSCOPY N/A 01/31/2018   Procedure: ESOPHAGOGASTRODUODENOSCOPY (EGD);  Surgeon: Ronnette Juniper, MD;  Location: [facility];  Service: Gastroenterology;  Laterality: N/A;  . FOREIGN BODY REMOVAL  01/31/2018   Procedure: FOREIGN BODY REMOVAL;  Surgeon: Ronnette Juniper, MD;  Location: [facility];  Service: Gastroenterology;;        Home Medications    Prior to Admission medications   Medication Sig Start Date End Date Taking? Authorizing Provider  amLODipine (NORVASC) 5 MG tablet Take 5 mg by mouth daily. 01/22/18   [provider]  aspirin EC 81 MG tablet Take 81 mg by mouth daily.    [provider]  Glucosamine-Chondroitin-Ca-D3 (TRIPLE FLEX 50+ PO) Take 1 tablet by mouth daily.    [provider]  lisinopril-hydrochlorothiazide (PRINZIDE,ZESTORETIC) 20-12.5 MG tablet Take 1 tablet by mouth  daily. 01/22/18   [provider]  Omega-3 Fatty Acids (FISH OIL) 1000 MG CAPS Take 1,000 mg by mouth daily.    [provider]  pantoprazole (PROTONIX) 40 MG tablet Take 1 tablet (40 mg total) by mouth 2 (two) times daily for 28 days. 01/31/18 02/28/18  Ronnette Juniper, MD  sucralfate (CARAFATE) 1 GM/10ML suspension Take 10 mLs (1 g total) by mouth 2 (two) times daily for 14 days. 01/31/18 02/14/18  Ronnette Juniper, MD    Family History History reviewed. No pertinent family history.  Social History Social History   Tobacco Use  . Smoking status: Never Smoker  . Smokeless tobacco: Never Used  Substance Use Topics  . Alcohol use: Yes    Comment: occasional  . Drug use: Never     Allergies   Codeine; Hydrocodone; Oxycodone; and Tramadol   Review of Systems Review of Systems  All other systems reviewed and are negative.    Physical Exam Updated Vital Signs BP (!) 164/100 (BP Location: Right Arm)   Pulse 70   Temp 97.6 F (36.4 C) (Oral)   Resp 18   SpO2 98%   Physical Exam  Constitutional: He is oriented to person, place, and time. He appears well-developed and well-nourished.  HENT:  Head: Normocephalic.  Right Ear: External ear normal.  Left Ear: External ear normal.  Eyes: Conjunctivae are normal.  Neck: Normal range of motion.  Cardiovascular: Normal rate.  Pulmonary/Chest: Effort normal and breath sounds  normal.  Abdominal: Soft.  Musculoskeletal: Normal range of motion.  Neurological: He is alert and oriented to person, place, and time.  Skin: Skin is warm and dry. Capillary refill takes less than 2 seconds.  Psychiatric: He has a normal mood and affect.  Nursing note and vitals reviewed.    ED Treatments / Results  Labs (all labs ordered are listed, but only abnormal results are displayed) Labs Reviewed  COMPREHENSIVE METABOLIC PANEL - Abnormal; Notable for the following components:      Result Value   Glucose, Bld 110 (*)    GFR calc non Af  Amer 58 (*)    All other components within normal limits  CBC WITH DIFFERENTIAL/PLATELET    EKG None  Radiology No results found.  Procedures Procedures (including critical care time)  Medications Ordered in ED Medications - No data to display   Initial Impression / Assessment and Plan / ED Course  I have reviewed the triage vital signs and the nursing notes.  Pertinent labs & imaging results that were available during my care of the patient were reviewed by me and considered in my medical decision making (see chart for details).     77 yo male with recent food impaction s/p endoscopy presents to ED with esophageal impaction after chicken teriyaki.  Initially unable to swallow liquids, but now able to swallow.  Discussed with Dr. Rosalie Gums and advises soft diet and follow up with Dr. Therisa Doyne Patient advised re treatment plan and need for follow up and voices understanding.  Final Clinical Impressions(s) / ED Diagnoses   Final diagnoses:  None    ED Discharge Orders    None       Pattricia Boss, MD 02/14/18 1024

## 2018-03-26 ENCOUNTER — Ambulatory Visit: Payer: Medicare Other | Admitting: Gastroenterology

## 2018-03-26 ENCOUNTER — Encounter

## 2018-03-26 ENCOUNTER — Encounter: Payer: Self-pay | Admitting: Gastroenterology

## 2018-03-26 VITALS — BP 140/80 | HR 64 | Ht 73.0 in | Wt 214.4 lb

## 2018-03-26 DIAGNOSIS — R1310 Dysphagia, unspecified: Secondary | ICD-10-CM

## 2018-03-26 DIAGNOSIS — R1319 Other dysphagia: Secondary | ICD-10-CM

## 2018-03-26 DIAGNOSIS — K2100 Gastro-esophageal reflux disease with esophagitis, without bleeding: Secondary | ICD-10-CM

## 2018-03-26 NOTE — Patient Instructions (Addendum)
Stop Carafate.   Remain on pantoprazole twice daily.   You have been scheduled for an endoscopy. Please follow written instructions given to you at your visit today. If you use inhalers (even only as needed), please bring them with you on the day of your procedure. Your physician has requested that you go to www.startemmi.com and enter the access code given to you at your visit today. This web site gives a general overview about your procedure. However, you should still follow specific instructions given to you by our office regarding your preparation for the procedure.  Thank you for choosing me and [facility].  Pricilla Riffle. Dagoberto Ligas., MD., [facility] Regal

## 2018-03-26 NOTE — Progress Notes (Signed)
History of Present Illness: This is a 77 year old male self referred for the evaluation dysphagia.  He relates a 30-month history of intermittent solid food dysphagia primarily to meats such as steak and chicken.  He had an acute food impaction and presented to the emergency department in early September and underwent EGD with clearance of food impaction by Dr. Therisa Doyne.  He has mild chronic constipation that is well managed with daily prunes.  He denies any typical reflux symptoms of heartburn and regurgitation over the past several years.  He was not on any treatment for reflux prior to his endoscopy.  I have treated a few of his family members so he switched his GI care from Dr. Therisa Doyne to me.  Denies weight loss, abdominal pain, diarrhea, change in stool caliber, melena, hematochezia, nausea, vomiting, reflux symptoms, chest pain.   EGD 01/2018 - Food in the lower third of the esophagus. - Non-bleeding esophageal ulcers. Biopsied. - LA Grade C erosive esophagitis. - Benign-appearing esophageal stenosis. - Erythematous mucosa in the antrum. - Normal examined duodenum.    Allergies  Allergen Reactions  . Codeine Itching and Nausea And Vomiting  . Hydrocodone Nausea And Vomiting  . Oxycodone Nausea And Vomiting  . Tramadol Itching and Nausea And Vomiting   Outpatient Medications Prior to Visit  Medication Sig Dispense Refill  . amLODipine (NORVASC) 5 MG tablet Take 5 mg by mouth daily.  0  . aspirin EC 81 MG tablet Take 81 mg by mouth daily.    . Glucosamine-Chondroitin-Ca-D3 (TRIPLE FLEX 50+ PO) Take 1 tablet by mouth daily.    . Glucosamine-Chondroitin-MSM (TRIPLE FLEX PO) Take 1 tablet by mouth daily.    Marland Kitchen lisinopril-hydrochlorothiazide (PRINZIDE,ZESTORETIC) 20-12.5 MG tablet Take 1 tablet by mouth daily.  0  . Omega-3 Fatty Acids (FISH OIL) 1000 MG CAPS Take 1,000 mg by mouth daily.    . pantoprazole (PROTONIX) 40 MG tablet Take 1 tablet (40 mg total) by mouth 2 (two) times daily for  28 days. 30 tablet 1  . sucralfate (CARAFATE) 1 g tablet Take 1 g by mouth 3 (three) times daily.  0  . vitamin E 400 UNIT capsule Take 800 Units by mouth daily.    . sucralfate (CARAFATE) 1 GM/10ML suspension Take 10 mLs (1 g total) by mouth 2 (two) times daily for 14 days. (Patient not taking: Reported on 02/12/2018) 420 mL 1   No facility-administered medications prior to visit.    Past Medical History:  Diagnosis Date  . Arthritis   . Food impaction of esophagus   . Gastric ulcer   . Hypertension    Past Surgical History:  Procedure Laterality Date  . BIOPSY  01/31/2018   Procedure: BIOPSY;  Surgeon: Ronnette Juniper, MD;  Location: [facility] ENDOSCOPY;  Service: Gastroenterology;;  . ESOPHAGOGASTRODUODENOSCOPY N/A 01/31/2018   Procedure: ESOPHAGOGASTRODUODENOSCOPY (EGD);  Surgeon: Ronnette Juniper, MD;  Location: [facility];  Service: Gastroenterology;  Laterality: N/A;  . FOREIGN BODY REMOVAL  01/31/2018   Procedure: FOREIGN BODY REMOVAL;  Surgeon: Ronnette Juniper, MD;  Location: [facility];  Service: Gastroenterology;;  . HERNIA REPAIR    . MOUTH SURGERY  2019  . TOTAL HIP ARTHROPLASTY Right    Social History   Socioeconomic History  . Marital status: Widowed    Spouse name: Not on file  . Number of children: Not on file  . Years of education: Not on file  . Highest education level: Not on file  Occupational History  .  Not on file  Social Needs  . Financial resource strain: Not on file  . Food insecurity:    Worry: Not on file    Inability: Not on file  . Transportation needs:    Medical: Not on file    Non-medical: Not on file  Tobacco Use  . Smoking status: Never Smoker  . Smokeless tobacco: Never Used  Substance and Sexual Activity  . Alcohol use: Yes    Comment: occasional  . Drug use: Never  . Sexual activity: Not on file  Lifestyle  . Physical activity:    Days per week: Not on file    Minutes per session: Not on file  . Stress: Not on file  Relationships  . Social  connections:    Talks on phone: Not on file    Gets together: Not on file    Attends religious service: Not on file    Active member of club or organization: Not on file    Attends meetings of clubs or organizations: Not on file    Relationship status: Not on file  Other Topics Concern  . Not on file  Social History Narrative  . Not on file   Family History  Problem Relation Age of Onset  . Colon cancer Neg Hx   . Esophageal cancer Neg Hx   . Pancreatic cancer Neg Hx   . Stomach cancer Neg Hx   . Liver disease Neg Hx        Review of Systems: Pertinent positive and negative review of systems were noted in the above HPI section. All other review of systems were otherwise negative.    Physical Exam: General: Well developed, well nourished, no acute distress Head: Normocephalic and atraumatic Eyes:  sclerae anicteric, EOMI Ears: Normal auditory acuity Mouth: No deformity or lesions Neck: Supple, no masses or thyromegaly Lungs: Clear throughout to auscultation Heart: Regular rate and rhythm; no murmurs, rubs or bruits Abdomen: Soft, non tender and non distended. No masses, hepatosplenomegaly or hernias noted. Normal Bowel sounds Rectal: Not done Musculoskeletal: Symmetrical with no gross deformities  Skin: No lesions on visible extremities Pulses:  Normal pulses noted Extremities: No clubbing, cyanosis, edema or deformities noted Neurological: Alert oriented x 4, grossly nonfocal Cervical Nodes:  No significant cervical adenopathy Inguinal Nodes: No significant inguinal adenopathy Psychological:  Alert and cooperative. Normal mood and affect   Assessment and Recommendations:  1. GERD with erosive esophagitis, esophageal ulcers, esophageal stricture.  Recent food impaction.  Continue to avoid meats until endoscopy completed.  Discontinue Carafate. Continue pantoprazole 40 mg twice daily.  Schedule EGD with possible dilation. The risks (including bleeding, perforation,  infection, missed lesions, medication reactions and possible hospitalization or surgery if complications occur), benefits, and alternatives to endoscopy with possible biopsy and possible dilation were discussed with the patient and they consent to proceed.

## 2018-04-01 ENCOUNTER — Encounter: Payer: Self-pay | Admitting: Gastroenterology

## 2018-04-01 ENCOUNTER — Ambulatory Visit (AMBULATORY_SURGERY_CENTER): Payer: Medicare Other | Admitting: Gastroenterology

## 2018-04-01 VITALS — BP 121/75 | HR 63 | Temp 98.7°F | Resp 19 | Ht 73.0 in | Wt 214.0 lb

## 2018-04-01 DIAGNOSIS — R1310 Dysphagia, unspecified: Secondary | ICD-10-CM

## 2018-04-01 DIAGNOSIS — E669 Obesity, unspecified: Secondary | ICD-10-CM | POA: Diagnosis not present

## 2018-04-01 DIAGNOSIS — K222 Esophageal obstruction: Secondary | ICD-10-CM

## 2018-04-01 DIAGNOSIS — R1319 Other dysphagia: Secondary | ICD-10-CM

## 2018-04-01 DIAGNOSIS — I10 Essential (primary) hypertension: Secondary | ICD-10-CM | POA: Diagnosis not present

## 2018-04-01 DIAGNOSIS — K219 Gastro-esophageal reflux disease without esophagitis: Secondary | ICD-10-CM | POA: Diagnosis not present

## 2018-04-01 DIAGNOSIS — K2270 Barrett's esophagus without dysplasia: Secondary | ICD-10-CM | POA: Diagnosis not present

## 2018-04-01 DIAGNOSIS — K449 Diaphragmatic hernia without obstruction or gangrene: Secondary | ICD-10-CM

## 2018-04-01 MED ORDER — SODIUM CHLORIDE 0.9 % IV SOLN: 500.0000 mL | Freq: Once | INTRAVENOUS | Status: DC

## 2018-04-01 MED ORDER — PANTOPRAZOLE SODIUM 40 MG PO TBEC: 40.0000 mg | DELAYED_RELEASE_TABLET | Freq: Every day | ORAL | 4 refills | Status: DC

## 2018-04-01 NOTE — Op Note (Signed)
[place] Patient Name: Mario Huerta Procedure Date: 04/01/2018 1:27 PM MRN: [id number] Endoscopist: Ladene Artist , MD Age: 77 Referring MD:  Date of Birth: June 14, 1940 Gender: Male Account #: [id number] Procedure:                Upper GI endoscopy Indications:              Dysphagia, Stricture of the esophagus Medicines:                Monitored Anesthesia Care Procedure:                Pre-Anesthesia Assessment:                           - Prior to the procedure, a History and Physical                            was performed, and patient medications and                            allergies were reviewed. The patient's tolerance of                            previous anesthesia was also reviewed. The risks                            and benefits of the procedure and the sedation                            options and risks were discussed with the patient.                            All questions were answered, and informed consent                            was obtained. Prior Anticoagulants: The patient has                            taken no previous anticoagulant or antiplatelet                            agents. ASA Grade Assessment: II - A patient with                            mild systemic disease. After reviewing the risks                            and benefits, the patient was deemed in                            satisfactory condition to undergo the procedure.                           After obtaining informed consent, the endoscope was  passed under direct vision. Throughout the                            procedure, the patient's blood pressure, pulse, and                            oxygen saturations were monitored continuously. The                            Endoscope was introduced through the mouth, and                            advanced to the second part of duodenum. The upper                            GI endoscopy was  accomplished without difficulty.                            The patient tolerated the procedure well. Scope In: Scope Out: Findings:                 Circumferential salmon-colored mucosa was present                            at 38 cm to 40 cm. The maximum longitudinal extent                            of these esophageal mucosal changes was 2 cm in                            length. Biopsies were taken with a cold forceps for                            histology.                           One benign-appearing, intrinsic moderate stenosis                            was found 38 cm from the incisors. This stenosis                            measured 1.1 cm (inner diameter). The stenosis was                            traversed. A guidewire was placed and the scope was                            withdrawn. Dilations were performed with Savary                            dilators with mild resistance at 12 mm, 13 mm and  14 mm. Heme noted on each dilator.                           The exam of the esophagus was otherwise normal.                           A small hiatal hernia was present.                           The exam of the stomach was otherwise normal.                           The duodenal bulb and second portion of the                            duodenum were normal. Complications:            No immediate complications. Estimated Blood Loss:     Estimated blood loss was minimal. 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 Biopsied.                           - Benign-appearing esophageal stenosis. Dilated.                           - Small hiatal hernia.                           - Normal duodenal bulb and second portion of the                            duodenum. Recommendation:           - Patient has a contact number available for                            emergencies. The  signs and symptoms of potential                            delayed complications were discussed with the                            patient. Return to normal activities tomorrow.                            Written discharge instructions were provided to the                            patient.                           - Clear liquid diet for 2 hours, then advance as  tolerated to soft diet today.                           - Continue present medications.                           - Await pathology results.                           - Return to GI office in 6 weeks. Ladene Artist, MD 04/01/2018 1:44:53 PM This report has been signed electronically.

## 2018-04-01 NOTE — Progress Notes (Signed)
Called to room to assist during endoscopic procedure.  Patient ID and intended procedure confirmed with present staff. Received instructions for my participation in the procedure from the performing physician.  

## 2018-04-01 NOTE — Patient Instructions (Signed)
*   dilation diet given. Protonix prescription ordered.*  YOU HAD AN ENDOSCOPIC PROCEDURE TODAY AT [facility]:   Refer to the procedure report that was given to you for any specific questions about what was found during the examination.  If the procedure report does not answer your questions, please call your gastroenterologist to clarify.  If you requested that your care partner not be given the details of your procedure findings, then the procedure report has been included in a sealed envelope for you to review at your convenience later.  YOU SHOULD EXPECT: Some feelings of bloating in the abdomen. Passage of more gas than usual.  Walking can help get rid of the air that was put into your GI tract during the procedure and reduce the bloating. If you had a lower endoscopy (such as a colonoscopy or flexible sigmoidoscopy) you may notice spotting of blood in your stool or on the toilet paper. If you underwent a bowel prep for your procedure, you may not have a normal bowel movement for a few days.  Please Note:  You might notice some irritation and congestion in your nose or some drainage.  This is from the oxygen used during your procedure.  There is no need for concern and it should clear up in a day or so.  SYMPTOMS TO REPORT IMMEDIATELY:    Following upper endoscopy (EGD)  Vomiting of blood or coffee ground material  New chest pain or pain under the shoulder blades  Painful or persistently difficult swallowing  New shortness of breath  Fever of 100F or higher  Black, tarry-looking stools  For urgent or emergent issues, a gastroenterologist can be reached at any hour by calling [phone].   DIET:  Clear liquids until 3:45, then soft foods for the rest of today.  Tommorrow you may proceed to your regular diet.  Drink plenty of fluids but you should avoid alcoholic beverages for 24 hours.  ACTIVITY:  You should plan to take it easy for the rest of today and you should  NOT DRIVE or use heavy machinery until tomorrow (because of the sedation medicines used during the test).    FOLLOW UP: Our staff will call the number listed on your records the next business day following your procedure to check on you and address any questions or concerns that you may have regarding the information given to you following your procedure. If we do not reach you, we will leave a message.  However, if you are feeling well and you are not experiencing any problems, there is no need to return our call.  We will assume that you have returned to your regular daily activities without incident.  If any biopsies were taken you will be contacted by phone or by letter within the next 1-3 weeks.  Please call us at [phone] if you have not heard about the biopsies in 3 weeks.    SIGNATURES/CONFIDENTIALITY: You and/or your care partner have signed paperwork which will be entered into your electronic medical record.  These signatures attest to the fact that that the information above on your After Visit Summary has been reviewed and is understood.  Full responsibility of the confidentiality of this discharge information lies with you and/or your care-partner.

## 2018-04-01 NOTE — Progress Notes (Signed)
Report to PACU, RN, vss, BBS= Clear.  

## 2018-04-02 ENCOUNTER — Telehealth: Payer: Self-pay

## 2018-04-02 NOTE — Telephone Encounter (Signed)
  Follow up Call-  Call back number 04/01/2018  Post procedure Call Back phone  # [phone]  Permission to leave phone message No  comments no voicemail  Some recent data might be hidden     Patient questions:  Do you have a fever, pain , or abdominal swelling? No. Pain Score  0 *  Have you tolerated food without any problems? Yes.    Have you been able to return to your normal activities? Yes.    Do you have any questions about your discharge instructions: Diet   No. Medications  No. Follow up visit  No.  Do you have questions or concerns about your Care? No.  Actions: * If pain score is 4 or above: No action needed, pain <4.  No problems noted per pt. maw

## 2018-04-23 ENCOUNTER — Encounter: Payer: Self-pay | Admitting: Gastroenterology

## 2018-04-23 NOTE — Telephone Encounter (Signed)
done

## 2018-05-02 ENCOUNTER — Telehealth: Payer: Self-pay | Admitting: Internal Medicine

## 2018-05-02 NOTE — Telephone Encounter (Signed)
Copied from [place] [id number]. Topic: Appointment Scheduling - Prior Auth Required for Appointment >> May 02, 2018  1:57 PM Blase Mess A wrote: Mario Huerta  is calling on behalf of her father. [name] is a patient of Dr. Alain Marion and she had a verbal converstation about Dr. Alain Marion taking her father on as a new patient.  Dr. Alain Marion agreed. At this time, the patient is asking permisson for Dr. Alain Marion to take him on as a new patient.  [name] is requesting a calling a call back to [phone] to schedule an appt.

## 2018-05-02 NOTE — Telephone Encounter (Signed)
Please advise 

## 2018-05-03 NOTE — Telephone Encounter (Signed)
Ok w/me. Pls ask Kelli not to sch her dad on the same day with her mom: her mom, I think, would have a problem with it. Thx

## 2018-05-09 NOTE — Telephone Encounter (Signed)
LVM for patient daughter to call back to make appt. Informed of MD response

## 2018-06-17 ENCOUNTER — Ambulatory Visit: Payer: Medicare Other | Admitting: Internal Medicine

## 2018-06-17 ENCOUNTER — Encounter: Payer: Self-pay | Admitting: Internal Medicine

## 2018-06-17 ENCOUNTER — Other Ambulatory Visit (INDEPENDENT_AMBULATORY_CARE_PROVIDER_SITE_OTHER): Payer: Medicare Other

## 2018-06-17 VITALS — BP 138/72 | HR 66 | Temp 98.1°F | Ht 73.0 in | Wt 214.0 lb

## 2018-06-17 DIAGNOSIS — E785 Hyperlipidemia, unspecified: Secondary | ICD-10-CM

## 2018-06-17 DIAGNOSIS — R202 Paresthesia of skin: Secondary | ICD-10-CM

## 2018-06-17 DIAGNOSIS — M542 Cervicalgia: Secondary | ICD-10-CM

## 2018-06-17 DIAGNOSIS — M545 Low back pain, unspecified: Secondary | ICD-10-CM | POA: Insufficient documentation

## 2018-06-17 DIAGNOSIS — K2270 Barrett's esophagus without dysplasia: Secondary | ICD-10-CM

## 2018-06-17 DIAGNOSIS — Z52008 Unspecified donor, other blood: Secondary | ICD-10-CM | POA: Insufficient documentation

## 2018-06-17 DIAGNOSIS — I10 Essential (primary) hypertension: Secondary | ICD-10-CM | POA: Insufficient documentation

## 2018-06-17 DIAGNOSIS — R21 Rash and other nonspecific skin eruption: Secondary | ICD-10-CM

## 2018-06-17 DIAGNOSIS — Z23 Encounter for immunization: Secondary | ICD-10-CM | POA: Diagnosis not present

## 2018-06-17 DIAGNOSIS — K252 Acute gastric ulcer with both hemorrhage and perforation: Secondary | ICD-10-CM

## 2018-06-17 DIAGNOSIS — G8929 Other chronic pain: Secondary | ICD-10-CM

## 2018-06-17 DIAGNOSIS — K222 Esophageal obstruction: Secondary | ICD-10-CM | POA: Insufficient documentation

## 2018-06-17 DIAGNOSIS — D485 Neoplasm of uncertain behavior of skin: Secondary | ICD-10-CM

## 2018-06-17 DIAGNOSIS — H6120 Impacted cerumen, unspecified ear: Secondary | ICD-10-CM | POA: Insufficient documentation

## 2018-06-17 DIAGNOSIS — H6121 Impacted cerumen, right ear: Secondary | ICD-10-CM

## 2018-06-17 DIAGNOSIS — L570 Actinic keratosis: Secondary | ICD-10-CM

## 2018-06-17 DIAGNOSIS — K209 Esophagitis, unspecified: Secondary | ICD-10-CM

## 2018-06-17 LAB — CBC WITH DIFFERENTIAL/PLATELET
Basophils Absolute: 0 10*3/uL (ref 0.0–0.1)
Basophils Relative: 0.7 % (ref 0.0–3.0)
EOS PCT: 2.6 % (ref 0.0–5.0)
Eosinophils Absolute: 0.2 10*3/uL (ref 0.0–0.7)
HCT: 44 % (ref 39.0–52.0)
HEMOGLOBIN: 15.1 g/dL (ref 13.0–17.0)
Lymphocytes Relative: 27.5 % (ref 12.0–46.0)
Lymphs Abs: 1.9 10*3/uL (ref 0.7–4.0)
MCHC: 34.4 g/dL (ref 30.0–36.0)
MCV: 91.1 fl (ref 78.0–100.0)
Monocytes Absolute: 0.7 10*3/uL (ref 0.1–1.0)
Monocytes Relative: 10.5 % (ref 3.0–12.0)
Neutro Abs: 4 10*3/uL (ref 1.4–7.7)
Neutrophils Relative %: 58.7 % (ref 43.0–77.0)
Platelets: 279 10*3/uL (ref 150.0–400.0)
RBC: 4.83 Mil/uL (ref 4.22–5.81)
RDW: 13.9 % (ref 11.5–15.5)
WBC: 6.9 10*3/uL (ref 4.0–10.5)

## 2018-06-17 LAB — BASIC METABOLIC PANEL
BUN: 17 mg/dL (ref 6–23)
CHLORIDE: 105 meq/L (ref 96–112)
CO2: 25 meq/L (ref 19–32)
Calcium: 9.1 mg/dL (ref 8.4–10.5)
Creatinine, Ser: 1.1 mg/dL (ref 0.40–1.50)
GFR: 64.61 mL/min (ref >60.00)
Glucose, Bld: 95 mg/dL (ref 70–99)
Potassium: 3.8 mEq/L (ref 3.5–5.1)
Sodium: 139 mEq/L (ref 135–145)

## 2018-06-17 LAB — HEPATIC FUNCTION PANEL
ALK PHOS: 99 U/L (ref 39–117)
ALT: 19 U/L (ref 0–53)
AST: 31 U/L (ref 0–37)
Albumin: 4.1 g/dL (ref 3.5–5.2)
Bilirubin, Direct: 0.1 mg/dL (ref 0.0–0.3)
Total Bilirubin: 0.5 mg/dL (ref 0.2–1.2)
Total Protein: 6.7 g/dL (ref 6.0–8.3)

## 2018-06-17 LAB — LIPID PANEL
Cholesterol: 157 mg/dL (ref 0–200)
HDL: 41.4 mg/dL (ref >39.00)
LDL Cholesterol: 88 mg/dL (ref 0–99)
NonHDL: 115.51
Total CHOL/HDL Ratio: 4
Triglycerides: 136 mg/dL (ref 0.0–149.0)
VLDL: 27.2 mg/dL (ref 0.0–40.0)

## 2018-06-17 LAB — TSH: TSH: 2.8 u[IU]/mL (ref 0.35–4.50)

## 2018-06-17 LAB — PSA: PSA: 1.62 ng/mL (ref 0.10–4.00)

## 2018-06-17 LAB — VITAMIN B12: Vitamin B-12: 683 pg/mL (ref 211–911)

## 2018-06-17 MED ORDER — AMLODIPINE BESYLATE 5 MG PO TABS: 5.0000 mg | ORAL_TABLET | Freq: Every day | ORAL | 3 refills | Status: DC

## 2018-06-17 MED ORDER — LISINOPRIL-HYDROCHLOROTHIAZIDE 20-12.5 MG PO TABS: 1.0000 | ORAL_TABLET | Freq: Every day | ORAL | 3 refills | Status: DC

## 2018-06-17 MED ORDER — VITAMIN D3 50 MCG (2000 UT) PO CAPS: 2000.0000 [IU] | ORAL_CAPSULE | Freq: Every day | ORAL | 3 refills | Status: DC

## 2018-06-17 NOTE — Assessment & Plan Note (Signed)
Will check CBC and iron

## 2018-06-17 NOTE — Assessment & Plan Note (Signed)
The patient will irrigate his right ear at home with a rubber bulb

## 2018-06-17 NOTE — Assessment & Plan Note (Addendum)
Dr Fuller Plan Continue with Protonix

## 2018-06-17 NOTE — Assessment & Plan Note (Signed)
Protonix.  ?

## 2018-06-17 NOTE — Assessment & Plan Note (Signed)
Cortisone cream around the right eye twice a day Wear protective goggles

## 2018-06-17 NOTE — Patient Instructions (Signed)
Cardiac CT calcium scoring test $150   Computed tomography, more commonly known as a CT or CAT scan, is a diagnostic medical imaging test. Like traditional x-rays, it produces multiple images or pictures of the inside of the body. The cross-sectional images generated during a CT scan can be reformatted in multiple planes. They can even generate three-dimensional images. These images can be viewed on a computer monitor, printed on film or by a 3D printer, or transferred to a CD or DVD. CT images of internal organs, bones, soft tissue and blood vessels provide greater detail than traditional x-rays, particularly of soft tissues and blood vessels. A cardiac CT scan for coronary calcium is a non-invasive way of obtaining information about the presence, location and extent of calcified plaque in the coronary arteries-the vessels that supply oxygen-containing blood to the heart muscle. Calcified plaque results when there is a build-up of fat and other substances under the inner layer of the artery. This material can calcify which signals the presence of atherosclerosis, a disease of the vessel wall, also called coronary artery disease (CAD). People with this disease have an increased risk for heart attacks. In addition, over time, progression of plaque build up (CAD) can narrow the arteries or even close off blood flow to the heart. The result may be chest pain, sometimes called "angina," or a heart attack. Because calcium is a marker of CAD, the amount of calcium detected on a cardiac CT scan is a helpful prognostic tool. The findings on cardiac CT are expressed as a calcium score. Another name for this test is coronary artery calcium scoring.  What are some common uses of the procedure? The goal of cardiac CT scan for calcium scoring is to determine if CAD is present and to what extent, even if there are no symptoms. It is a screening study that may be recommended by a physician for patients with risk factors  for CAD but no clinical symptoms. The major risk factors for CAD are: . high blood cholesterol levels  . family history of heart attacks  . diabetes  . high blood pressure  . cigarette smoking  . overweight or obese  . physical inactivity   A negative cardiac CT scan for calcium scoring shows no calcification within the coronary arteries. This suggests that CAD is absent or so minimal it cannot be seen by this technique. The chance of having a heart attack over the next two to five years is very low under these circumstances. A positive test means that CAD is present, regardless of whether or not the patient is experiencing any symptoms. The amount of calcification-expressed as the calcium score-may help to predict the likelihood of a myocardial infarction (heart attack) in the coming years and helps your medical doctor or cardiologist decide whether the patient may need to take preventive medicine or undertake other measures such as diet and exercise to lower the risk for heart attack. The extent of CAD is graded according to your calcium score:  Calcium Score  Presence of CAD  0 No evidence of CAD   1-10 Minimal evidence of CAD  11-100 Mild evidence of CAD  101-400 Moderate evidence of CAD  Over 400 Extensive evidence of CAD    Cologuard    Skin bx w/me

## 2018-06-17 NOTE — Assessment & Plan Note (Signed)
B LE Labs

## 2018-06-17 NOTE — Assessment & Plan Note (Signed)
Amlodipine and lisinopril HCT CT cardiac calcium scoring test option was offered  

## 2018-06-17 NOTE — Assessment & Plan Note (Signed)
Multiple on the scalp.  Return to clinic for cryosurgery

## 2018-06-17 NOTE — Assessment & Plan Note (Signed)
Protonix Dr Fuller Plan EGD in November 2019

## 2018-06-17 NOTE — Progress Notes (Signed)
Subjective:  Patient ID: Mario Huerta, male    DOB: 07-18-1940  Age: 78 y.o. MRN: [id number]  CC: No chief complaint on file.   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 Paulla Fore had done upper endoscopy in November 2019.  He is currently on pantoprazole Complains of chronic neck pain -he has been refusing surgery and seeing Dr Rozelle Logan in [facility] for acupuncture. He is a new pt Refused colonoscopy     Outpatient Medications Prior to Visit  Medication Sig Dispense Refill  . amLODipine (NORVASC) 5 MG tablet Take 5 mg by mouth daily.  0  . aspirin EC 81 MG tablet Take 81 mg by mouth daily.    . Glucosamine-Chondroitin-MSM (TRIPLE FLEX PO) Take 1 tablet by mouth daily.    . IRON, FERROUS SULFATE, PO Take by mouth.    Marland Kitchen lisinopril-hydrochlorothiazide (PRINZIDE,ZESTORETIC) 20-12.5 MG tablet Take 1 tablet by mouth daily.  0  . Omega-3 Fatty Acids (FISH OIL) 1000 MG CAPS Take 1,000 mg by mouth daily.    . pantoprazole (PROTONIX) 40 MG tablet Take 1 tablet (40 mg total) by mouth daily. 90 tablet 4  . sildenafil (VIAGRA) 100 MG tablet Take 100 mg by mouth daily as needed for erectile dysfunction.    . vitamin E 400 UNIT capsule Take 800 Units by mouth daily.    . sucralfate (CARAFATE) 1 g tablet Take 1 g by mouth 3 (three) times daily.  0   No facility-administered medications prior to visit.     ROS: Review of Systems  Constitutional: Negative for appetite change, fatigue and unexpected weight change.  HENT: Negative for congestion, nosebleeds, sneezing, sore throat and trouble swallowing.   Eyes: Negative for itching and visual disturbance.  Respiratory: Negative for cough.   Cardiovascular: Negative for chest pain, palpitations and leg swelling.  Gastrointestinal: Negative for abdominal distention, blood in stool, diarrhea and nausea.  Genitourinary: Negative for  frequency and hematuria.  Musculoskeletal: Negative for back pain, gait problem, joint swelling and neck pain.  Skin: Negative for rash.  Neurological: Negative for dizziness, tremors, speech difficulty and weakness.  Psychiatric/Behavioral: Negative for agitation, dysphoric mood, sleep disturbance and suicidal ideas. The patient is not nervous/anxious.     Objective:  BP 138/72 (BP Location: Left Arm, Patient Position: Sitting, Cuff Size: Large)   Pulse 66   Temp 98.1 F (36.7 C) (Oral)   Ht 6\' 1"  (1.854 m)   Wt 214 lb (97.1 kg)   SpO2 96%   BMI 28.23 kg/m   BP Readings from Last 3 Encounters:  06/17/18 138/72  04/01/18 121/75  03/26/18 140/80    Wt Readings from Last 3 Encounters:  06/17/18 214 lb (97.1 kg)  04/01/18 214 lb (97.1 kg)  03/26/18 214 lb 6.4 oz (97.3 kg)    Physical Exam Constitutional:      General: He is not in acute distress.    Appearance: He is well-developed.     Comments: NAD  Eyes:     Conjunctiva/sclera: Conjunctivae normal.     Pupils: Pupils are equal, round, and reactive to light.  Neck:     Musculoskeletal: Normal range of motion.     Thyroid: No thyromegaly.     Vascular: No JVD.  Cardiovascular:     Rate and Rhythm: Normal rate and regular rhythm.     Heart sounds: Normal heart sounds. No murmur. No friction rub. No  gallop.   Pulmonary:     Effort: Pulmonary effort is normal. No respiratory distress.     Breath sounds: Normal breath sounds. No wheezing or rales.  Chest:     Chest wall: No tenderness.  Abdominal:     General: Bowel sounds are normal. There is no distension.     Palpations: Abdomen is soft. There is no mass.     Tenderness: There is no abdominal tenderness. There is no guarding or rebound.  Musculoskeletal: Normal range of motion.        General: No tenderness.  Lymphadenopathy:     Cervical: No cervical adenopathy.  Skin:    General: Skin is warm and dry.     Findings: No rash.  Neurological:     Mental  Status: He is alert and oriented to person, place, and time.     Cranial Nerves: No cranial nerve deficit.     Motor: No abnormal muscle tone.     Coordination: Coordination normal.     Gait: Gait normal.     Deep Tendon Reflexes: Reflexes are normal and symmetric.  Psychiatric:        Behavior: Behavior normal.        Thought Content: Thought content normal.        Judgment: Judgment normal.   neck painful LS painful Actinic keratosis lesions on scalp Growth on L elbow inner Rash around R eye  Wax R ear  Lab Results  Component Value Date   WBC 5.8 02/12/2018   HGB 14.8 02/12/2018   HCT 42.8 02/12/2018   PLT 227 02/12/2018   GLUCOSE 110 (H) 02/12/2018   ALT 22 02/12/2018   AST 32 02/12/2018   NA 141 02/12/2018   K 3.6 02/12/2018   CL 107 02/12/2018   CREATININE 1.16 02/12/2018   BUN 12 02/12/2018   CO2 23 02/12/2018    No results found.  Assessment & Plan:   There are no diagnoses linked to this encounter.   No orders of the defined types were placed in this encounter.    Follow-up: No follow-ups on file.  Walker Kehr, MD

## 2018-06-17 NOTE — Assessment & Plan Note (Signed)
Left inner elbow lesion.  Skin biopsy with me

## 2018-08-16 NOTE — Telephone Encounter (Signed)
Cologuard ordered

## 2019-04-10 ENCOUNTER — Other Ambulatory Visit: Payer: Self-pay | Admitting: Internal Medicine

## 2019-06-01 ENCOUNTER — Other Ambulatory Visit: Payer: Self-pay | Admitting: Internal Medicine

## 2019-06-01 ENCOUNTER — Other Ambulatory Visit: Payer: Self-pay | Admitting: Gastroenterology

## 2019-06-02 ENCOUNTER — Telehealth: Payer: Self-pay | Admitting: Gastroenterology

## 2019-06-02 NOTE — Telephone Encounter (Signed)
Left a message for patient to return my call. 

## 2019-06-03 MED ORDER — PANTOPRAZOLE SODIUM 40 MG PO TBEC: 40.0000 mg | DELAYED_RELEASE_TABLET | Freq: Every day | ORAL | 0 refills | Status: DC

## 2019-06-03 NOTE — Telephone Encounter (Signed)
Informed patient's daughter that her father is due for a follow up visit and we can send a prescription refill to the pharmacy until appt. Scheduled appt for 07/09/19 at 8:30am. Prescription sent to patient's pharmacy.

## 2019-07-09 ENCOUNTER — Encounter: Payer: Self-pay | Admitting: Gastroenterology

## 2019-07-09 ENCOUNTER — Ambulatory Visit: Payer: Medicare Other | Admitting: Gastroenterology

## 2019-07-09 VITALS — BP 130/62 | HR 64 | Temp 97.9°F | Ht 70.5 in | Wt 212.0 lb

## 2019-07-09 DIAGNOSIS — K2270 Barrett's esophagus without dysplasia: Secondary | ICD-10-CM | POA: Diagnosis not present

## 2019-07-09 DIAGNOSIS — Z1211 Encounter for screening for malignant neoplasm of colon: Secondary | ICD-10-CM

## 2019-07-09 DIAGNOSIS — Z1212 Encounter for screening for malignant neoplasm of rectum: Secondary | ICD-10-CM

## 2019-07-09 MED ORDER — PANTOPRAZOLE SODIUM 40 MG PO TBEC: 40.0000 mg | DELAYED_RELEASE_TABLET | Freq: Every day | ORAL | 3 refills | Status: DC

## 2019-07-09 NOTE — Patient Instructions (Signed)
We have sent the following medications to your pharmacy for you to pick up at your convenience: pantoprazole.   We encourage you to have your Cologard finished with your primary care physician for colorectal screening.    Normal BMI (Body Mass Index- based on height and weight) is between 23 and 30. Your BMI today is Body mass index is 29.99 kg/m. Marland Kitchen Please consider follow up  regarding your BMI with your Primary Care Provider.  Thank you for choosing me and [facility].  Pricilla Riffle. Dagoberto Ligas., MD., Marval Regal

## 2019-07-09 NOTE — Progress Notes (Signed)
    History of Present Illness: This is a 79 year old male with short segment Barrett's without dysplasia and a history of an esophageal stricture. No reflux symptoms or dysphagia.  He states he has prunes on a daily basis.  He declines colonoscopy however Cologuard was advised by his PCP which he has not yet completed.   EGD 03/2018 - Salmon-colored mucosa suspicious for short-segment Barrett's esophagus. Biopsied.(Barrett's, no dysplasia) - Benign-appearing esophageal stenosis. Dilated. - Small hiatal hernia. - Normal duodenal bulb and second portion of the duodenum.  Current Medications, Allergies, Past Medical History, Past Surgical History, Family History and Social History were reviewed in [employer] record.   Physical Exam: General: Well developed, well nourished, no acute distress Head: Normocephalic and atraumatic Eyes:  sclerae anicteric, EOMI Ears: Normal auditory acuity Mouth: No deformity or lesions Lungs: Clear throughout to auscultation Heart: Regular rate and rhythm; no murmurs, rubs or bruits Abdomen: Soft, non tender and non distended. No masses, hepatosplenomegaly or hernias noted. Normal Bowel sounds Rectal: Not done  Musculoskeletal: Symmetrical with no gross deformities  Pulses:  Normal pulses noted Extremities: No clubbing, cyanosis, edema or deformities noted Neurological: Alert oriented x 4, grossly nonfocal Psychological:  Alert and cooperative. Normal mood and affect   Assessment and Recommendations:  1. 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  History of esophageal stricture.  Continue pantoprazole 40 mg daily and follow standard antireflux measures.  Surveillance EGD recommended in November 2021.  2.  CRC screening average risk.  No prior colonoscopy.  He declines colonoscopy.  Patient strongly advised to complete Cologuard.

## 2019-07-12 ENCOUNTER — Other Ambulatory Visit: Payer: Self-pay | Admitting: Internal Medicine

## 2019-07-21 ENCOUNTER — Ambulatory Visit: Payer: Medicare Other | Admitting: Internal Medicine

## 2019-08-06 ENCOUNTER — Ambulatory Visit (INDEPENDENT_AMBULATORY_CARE_PROVIDER_SITE_OTHER): Payer: Medicare Other | Admitting: Internal Medicine

## 2019-08-06 ENCOUNTER — Other Ambulatory Visit: Payer: Self-pay

## 2019-08-06 ENCOUNTER — Encounter: Payer: Self-pay | Admitting: Internal Medicine

## 2019-08-06 VITALS — BP 140/86 | HR 64 | Temp 98.0°F | Ht 70.5 in | Wt 209.0 lb

## 2019-08-06 DIAGNOSIS — G8929 Other chronic pain: Secondary | ICD-10-CM

## 2019-08-06 DIAGNOSIS — R202 Paresthesia of skin: Secondary | ICD-10-CM

## 2019-08-06 DIAGNOSIS — Z52008 Unspecified donor, other blood: Secondary | ICD-10-CM

## 2019-08-06 DIAGNOSIS — G6289 Other specified polyneuropathies: Secondary | ICD-10-CM

## 2019-08-06 DIAGNOSIS — N320 Bladder-neck obstruction: Secondary | ICD-10-CM | POA: Diagnosis not present

## 2019-08-06 DIAGNOSIS — I10 Essential (primary) hypertension: Secondary | ICD-10-CM | POA: Diagnosis not present

## 2019-08-06 DIAGNOSIS — Z0000 Encounter for general adult medical examination without abnormal findings: Secondary | ICD-10-CM

## 2019-08-06 DIAGNOSIS — K2270 Barrett's esophagus without dysplasia: Secondary | ICD-10-CM

## 2019-08-06 DIAGNOSIS — K209 Esophagitis, unspecified without bleeding: Secondary | ICD-10-CM

## 2019-08-06 DIAGNOSIS — E559 Vitamin D deficiency, unspecified: Secondary | ICD-10-CM | POA: Diagnosis not present

## 2019-08-06 DIAGNOSIS — I739 Peripheral vascular disease, unspecified: Secondary | ICD-10-CM | POA: Insufficient documentation

## 2019-08-06 DIAGNOSIS — G629 Polyneuropathy, unspecified: Secondary | ICD-10-CM | POA: Insufficient documentation

## 2019-08-06 LAB — BASIC METABOLIC PANEL
BUN: 16 mg/dL (ref 6–23)
CO2: 27 mEq/L (ref 19–32)
Calcium: 9.2 mg/dL (ref 8.4–10.5)
Chloride: 105 mEq/L (ref 96–112)
Creatinine, Ser: 0.93 mg/dL (ref 0.40–1.50)
GFR: 78.2 mL/min (ref >60.00)
Glucose, Bld: 93 mg/dL (ref 70–99)
Potassium: 4.2 mEq/L (ref 3.5–5.1)
Sodium: 138 mEq/L (ref 135–145)

## 2019-08-06 LAB — CBC WITH DIFFERENTIAL/PLATELET
Basophils Absolute: 0 10*3/uL (ref 0.0–0.1)
Basophils Relative: 0.6 % (ref 0.0–3.0)
Eosinophils Absolute: 0.1 10*3/uL (ref 0.0–0.7)
Eosinophils Relative: 1.6 % (ref 0.0–5.0)
HCT: 43.5 % (ref 39.0–52.0)
Hemoglobin: 15.2 g/dL (ref 13.0–17.0)
Lymphocytes Relative: 23 % (ref 12.0–46.0)
Lymphs Abs: 1.3 10*3/uL (ref 0.7–4.0)
MCHC: 34.9 g/dL (ref 30.0–36.0)
MCV: 92.3 fl (ref 78.0–100.0)
Monocytes Absolute: 0.5 10*3/uL (ref 0.1–1.0)
Monocytes Relative: 9 % (ref 3.0–12.0)
Neutro Abs: 3.7 10*3/uL (ref 1.4–7.7)
Neutrophils Relative %: 65.8 % (ref 43.0–77.0)
Platelets: 237 10*3/uL (ref 150.0–400.0)
RBC: 4.72 Mil/uL (ref 4.22–5.81)
RDW: 13.5 % (ref 11.5–15.5)
WBC: 5.6 10*3/uL (ref 4.0–10.5)

## 2019-08-06 LAB — HEPATIC FUNCTION PANEL
ALT: 22 U/L (ref 0–53)
AST: 34 U/L (ref 0–37)
Albumin: 3.9 g/dL (ref 3.5–5.2)
Alkaline Phosphatase: 101 U/L (ref 39–117)
Bilirubin, Direct: 0.2 mg/dL (ref 0.0–0.3)
Total Bilirubin: 0.7 mg/dL (ref 0.2–1.2)
Total Protein: 6.6 g/dL (ref 6.0–8.3)

## 2019-08-06 LAB — LIPID PANEL
Cholesterol: 150 mg/dL (ref 0–200)
HDL: 45.2 mg/dL (ref >39.00)
LDL Cholesterol: 86 mg/dL (ref 0–99)
NonHDL: 105.2
Total CHOL/HDL Ratio: 3
Triglycerides: 94 mg/dL (ref 0.0–149.0)
VLDL: 18.8 mg/dL (ref 0.0–40.0)

## 2019-08-06 LAB — URINALYSIS
Bilirubin Urine: NEGATIVE
Hgb urine dipstick: NEGATIVE
Ketones, ur: NEGATIVE
Leukocytes,Ua: NEGATIVE
Nitrite: NEGATIVE
Specific Gravity, Urine: 1.02 (ref 1.000–1.030)
Total Protein, Urine: NEGATIVE
Urine Glucose: NEGATIVE
Urobilinogen, UA: 0.2 (ref 0.0–1.0)
pH: 5.5 (ref 5.0–8.0)

## 2019-08-06 LAB — SEDIMENTATION RATE: Sed Rate: 23 mm/hr — ABNORMAL HIGH (ref 0–20)

## 2019-08-06 LAB — PSA: PSA: 1.26 ng/mL (ref 0.10–4.00)

## 2019-08-06 LAB — TSH: TSH: 2.22 u[IU]/mL (ref 0.35–4.50)

## 2019-08-06 MED ORDER — GABAPENTIN 100 MG PO CAPS: 100.0000 mg | ORAL_CAPSULE | Freq: Three times a day (TID) | ORAL | 11 refills | Status: DC | PRN

## 2019-08-06 NOTE — Assessment & Plan Note (Addendum)
Worse   B LE numbness - worse (3 years) B complex Gabapentin Neurol ref

## 2019-08-06 NOTE — Assessment & Plan Note (Addendum)
Amlodipine and lisinopril HCT CT cardiac calcium scoring test option was offered

## 2019-08-06 NOTE — Progress Notes (Signed)
Subjective:  Patient ID: Mario Huerta, male    DOB: 1940-10-06  Age: 79 y.o. MRN: [id number]  CC: No chief complaint on file.   HPI NEREO SHOBERT presents for HTN, GERD f/u C/o B LE numbness - worse (3 years) Taking iron - he was a blood donor   Outpatient Medications Prior to Visit  Medication Sig Dispense Refill  . amLODipine (NORVASC) 5 MG tablet TAKE 1 TABLET BY MOUTH DAILY 90 tablet 3  . aspirin EC 81 MG tablet Take 81 mg by mouth daily.    . Cholecalciferol (VITAMIN D3) 50 MCG (2000 UT) capsule Take 1 capsule (2,000 Units total) by mouth daily. 100 capsule 3  . Glucosamine-Chondroitin-MSM (TRIPLE FLEX PO) Take 1 tablet by mouth daily.    Marland Kitchen lisinopril-hydrochlorothiazide (ZESTORETIC) 20-12.5 MG tablet TAKE 1 TABLET BY MOUTH DAILY 90 tablet 3  . Omega-3 Fatty Acids (FISH OIL) 1000 MG CAPS Take 1,000 mg by mouth daily.    . pantoprazole (PROTONIX) 40 MG tablet Take 1 tablet (40 mg total) by mouth daily. 90 tablet 3  . UNABLE TO FIND Med Name: Neuropaquell    . IRON, FERROUS SULFATE, PO Take by mouth.    . sildenafil (VIAGRA) 100 MG tablet Take 100 mg by mouth daily as needed for erectile dysfunction.    . vitamin E 400 UNIT capsule Take 800 Units by mouth daily.     No facility-administered medications prior to visit.    ROS: Review of Systems  Constitutional: Negative for appetite change, fatigue and unexpected weight change.  HENT: Negative for congestion, nosebleeds, sneezing, sore throat and trouble swallowing.   Eyes: Negative for itching and visual disturbance.  Respiratory: Negative for cough.   Cardiovascular: Negative for chest pain, palpitations and leg swelling.  Gastrointestinal: Negative for abdominal distention, blood in stool, diarrhea and nausea.  Genitourinary: Negative for frequency and hematuria.  Musculoskeletal: Positive for back pain and gait problem. Negative for joint swelling and neck pain.  Skin: Negative for rash.  Neurological: Positive  for numbness. Negative for dizziness, tremors, speech difficulty and weakness.  Psychiatric/Behavioral: Negative for agitation, dysphoric mood and sleep disturbance. The patient is not nervous/anxious.     Objective:  BP 140/86 (BP Location: Right Arm, Patient Position: Sitting, Cuff Size: Normal)   Pulse 64   Temp 98 F (36.7 C) (Oral)   Ht 5' 10.5" (1.791 m)   Wt 209 lb (94.8 kg)   SpO2 96%   BMI 29.56 kg/m   BP Readings from Last 3 Encounters:  08/06/19 140/86  07/09/19 130/62  06/17/18 138/72    Wt Readings from Last 3 Encounters:  08/06/19 209 lb (94.8 kg)  07/09/19 212 lb (96.2 kg)  06/17/18 214 lb (97.1 kg)    Physical Exam Constitutional:      General: He is not in acute distress.    Appearance: He is well-developed.     Comments: NAD  Eyes:     Conjunctiva/sclera: Conjunctivae normal.     Pupils: Pupils are equal, round, and reactive to light.  Neck:     Thyroid: No thyromegaly.     Vascular: No JVD.  Cardiovascular:     Rate and Rhythm: Normal rate and regular rhythm.     Heart sounds: Normal heart sounds. No murmur. No friction rub. No gallop.   Pulmonary:     Effort: Pulmonary effort is normal. No respiratory distress.     Breath sounds: Normal breath sounds. No wheezing or rales.  Chest:  Chest wall: No tenderness.  Abdominal:     General: Bowel sounds are normal. There is no distension.     Palpations: Abdomen is soft. There is no mass.     Tenderness: There is no abdominal tenderness. There is no guarding or rebound.  Musculoskeletal:        General: No tenderness. Normal range of motion.     Cervical back: Normal range of motion.  Lymphadenopathy:     Cervical: No cervical adenopathy.  Skin:    General: Skin is warm and dry.     Findings: No rash.  Neurological:     Mental Status: He is alert and oriented to person, place, and time.     Cranial Nerves: No cranial nerve deficit.     Motor: No abnormal muscle tone.     Coordination:  Coordination normal.     Gait: Gait abnormal.     Deep Tendon Reflexes: Reflexes are normal and symmetric.  Psychiatric:        Behavior: Behavior normal.        Thought Content: Thought content normal.        Judgment: Judgment normal.     Lab Results  Component Value Date   WBC 6.9 06/17/2018   HGB 15.1 06/17/2018   HCT 44.0 06/17/2018   PLT 279.0 06/17/2018   GLUCOSE 95 06/17/2018   CHOL 157 06/17/2018   TRIG 136.0 06/17/2018   HDL 41.40 06/17/2018   LDLCALC 88 06/17/2018   ALT 19 06/17/2018   AST 31 06/17/2018   NA 139 06/17/2018   K 3.8 06/17/2018   CL 105 06/17/2018   CREATININE 1.10 06/17/2018   BUN 17 06/17/2018   CO2 25 06/17/2018   TSH 2.80 06/17/2018   PSA 1.62 06/17/2018    No results found.  Assessment & Plan:    Walker Kehr, MD

## 2019-08-06 NOTE — Addendum Note (Signed)
Addended by: Karren Cobble on: 08/06/2019 03:40 PM   Modules accepted: Orders

## 2019-08-06 NOTE — Assessment & Plan Note (Signed)
?  spinal stenosis

## 2019-08-06 NOTE — Patient Instructions (Signed)
Diabetic socks 

## 2019-08-06 NOTE — Assessment & Plan Note (Signed)
Neuro vs arterial Art Doppler

## 2019-08-06 NOTE — Assessment & Plan Note (Signed)
B LE numbness - worse (3 years)

## 2019-08-06 NOTE — Assessment & Plan Note (Signed)
CBC

## 2019-08-06 NOTE — Assessment & Plan Note (Signed)
Protonix B12 labs

## 2019-08-07 LAB — VITAMIN D 25 HYDROXY (VIT D DEFICIENCY, FRACTURES): VITD: 74.72 ng/mL (ref 30.00–100.00)

## 2019-08-08 LAB — PROTEIN ELECTROPHORESIS, SERUM
Albumin ELP: 3.9 g/dL (ref 3.8–4.8)
Alpha 1: 0.3 g/dL (ref 0.2–0.3)
Alpha 2: 0.7 g/dL (ref 0.5–0.9)
Beta 2: 0.4 g/dL (ref 0.2–0.5)
Beta Globulin: 0.4 g/dL (ref 0.4–0.6)
Gamma Globulin: 1 g/dL (ref 0.8–1.7)
Total Protein: 6.5 g/dL (ref 6.1–8.1)

## 2019-08-19 ENCOUNTER — Ambulatory Visit (HOSPITAL_COMMUNITY)
Admission: RE | Admit: 2019-08-19 | Discharge: 2019-08-19 | Disposition: A | Discharge status: Home / Self Care | Payer: Medicare Other | Source: Ambulatory Visit | Attending: Cardiovascular Disease | Admitting: Cardiovascular Disease

## 2019-08-19 ENCOUNTER — Other Ambulatory Visit: Payer: Self-pay

## 2019-08-19 DIAGNOSIS — I739 Peripheral vascular disease, unspecified: Secondary | ICD-10-CM | POA: Insufficient documentation

## 2019-09-21 IMAGING — CR DG CHEST 2V
2 series · 2 of 2 positions shown · non-contrast
Comparison: Chest x-ray dated 07/31/2008.

CLINICAL DATA: Pt states he got a piece of steak stuck in his
throat last night. Pt states he got new teeth and isnt used to them
yet. Pt denies cp and sob.

EXAM:
CHEST - 2 VIEW

[chest pa]
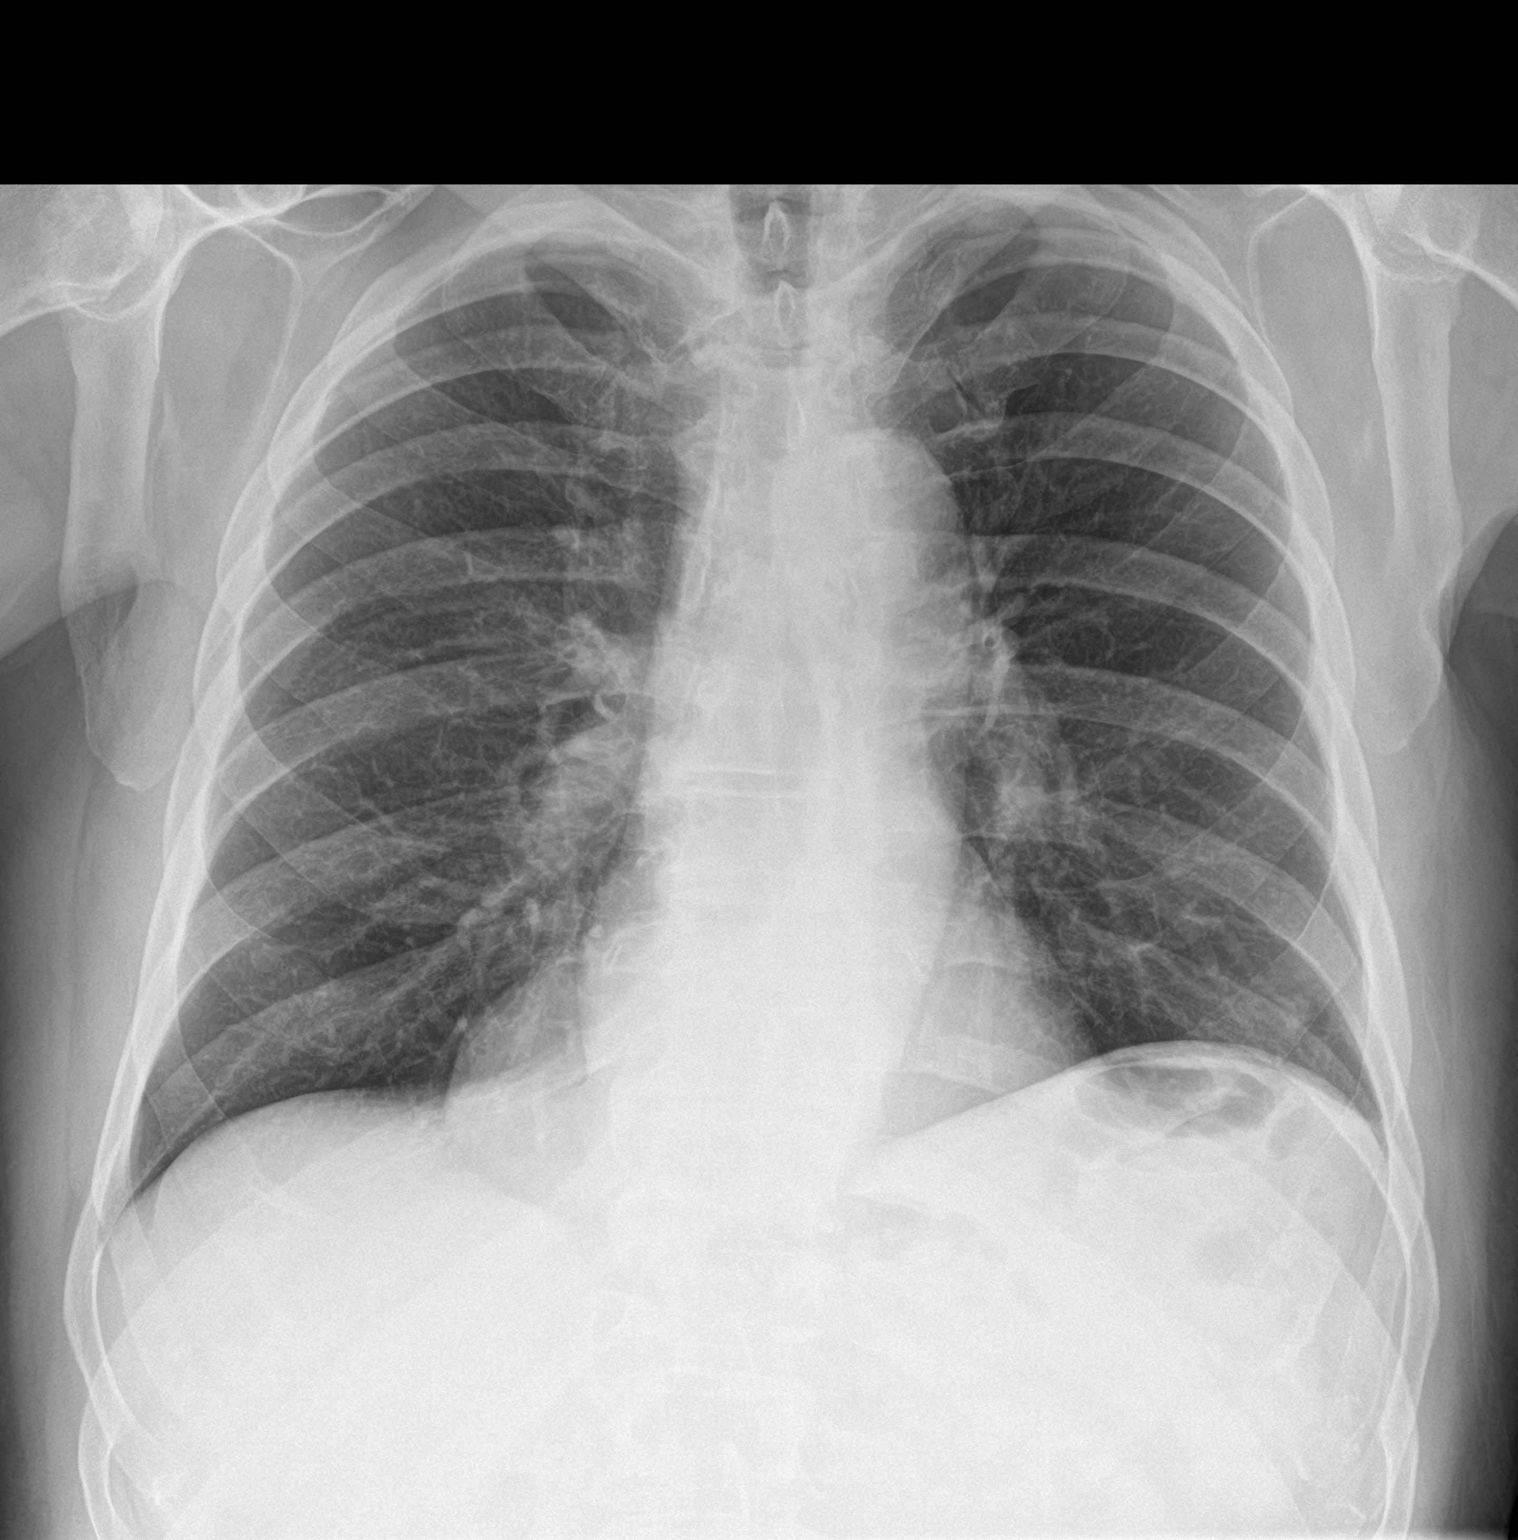

[chest lat]
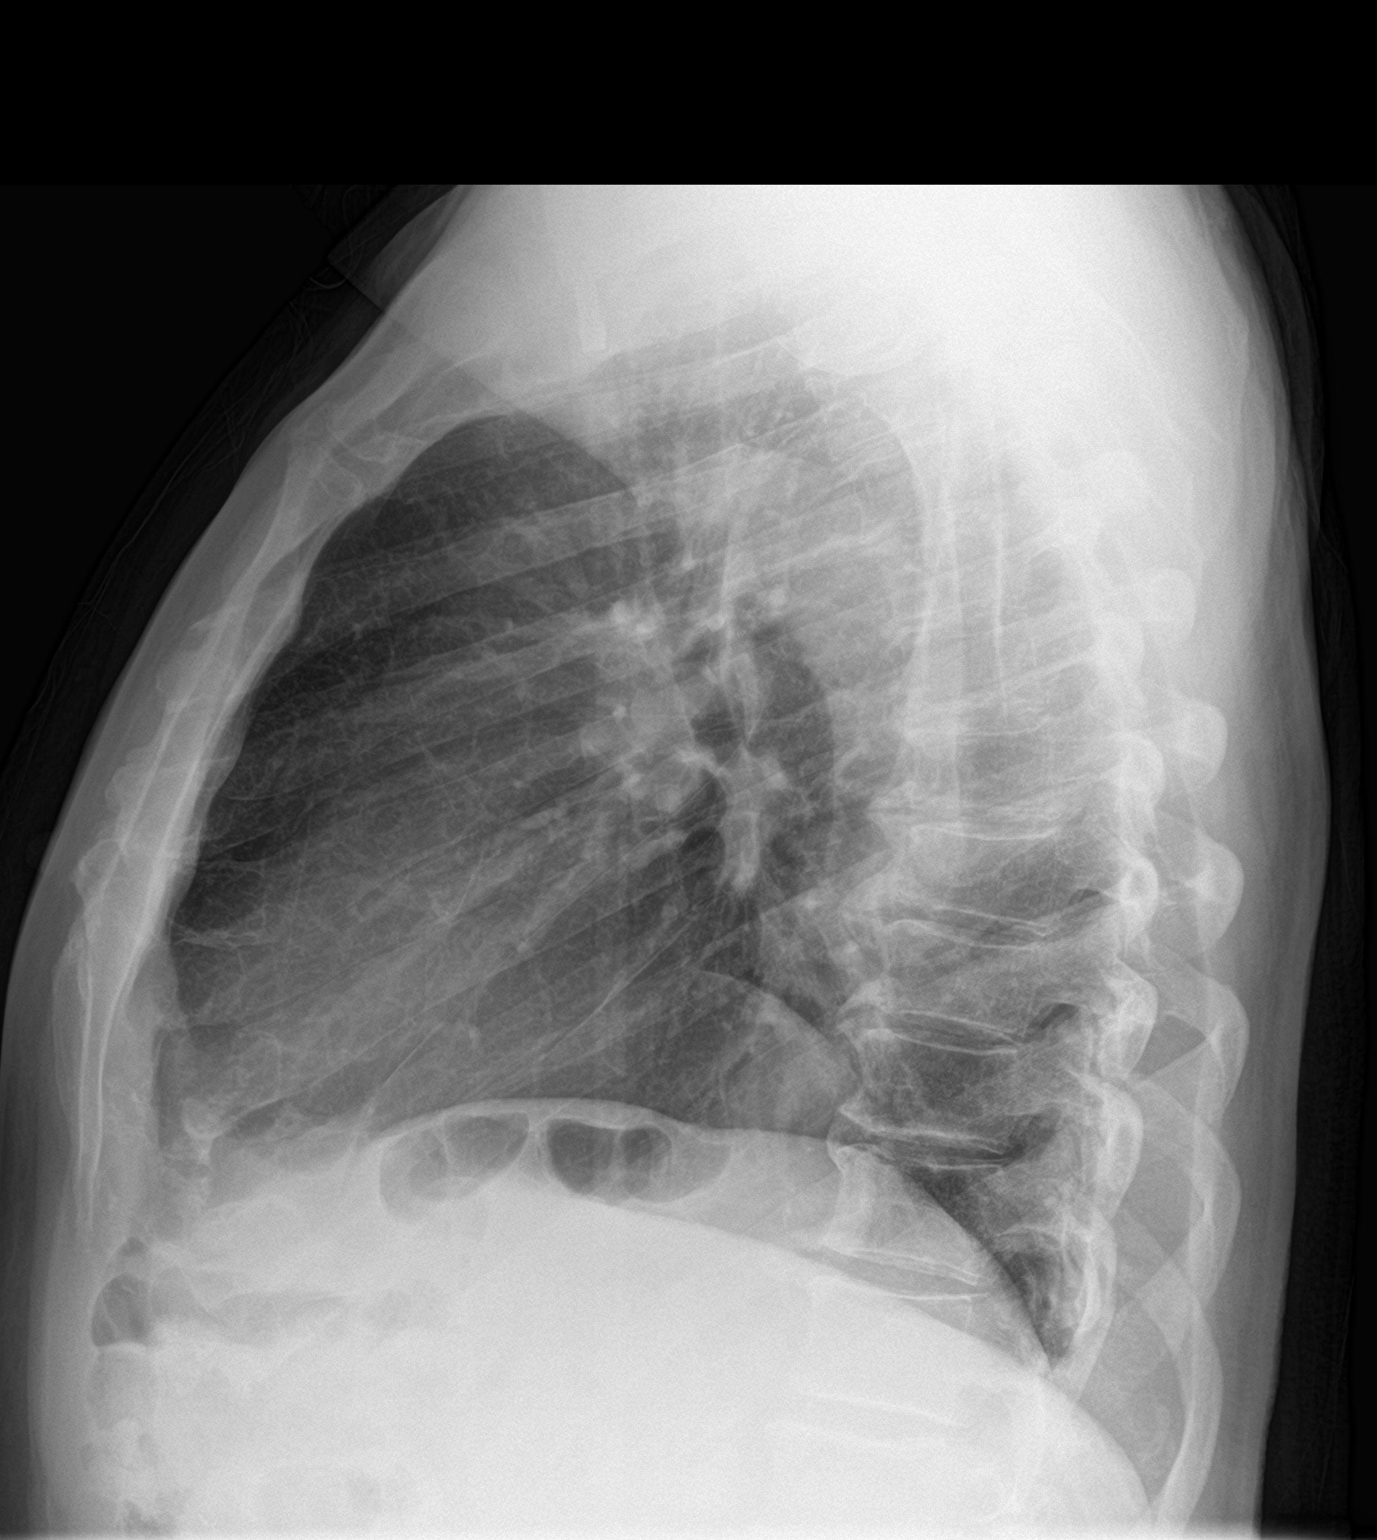

[2 of 2 positions shown; findings below may reference images not displayed]

FINDINGS: Heart size and mediastinal contours are within normal limits. No
radiodense foreign body within the mediastinum or upper abdomen.
Lungs are clear. Osseous structures about the chest are
unremarkable.
IMPRESSION: No acute findings. No radiodense foreign body seen within the
mediastinum or upper abdomen.

## 2019-10-07 ENCOUNTER — Encounter: Payer: Self-pay | Admitting: Internal Medicine

## 2019-10-07 ENCOUNTER — Ambulatory Visit (INDEPENDENT_AMBULATORY_CARE_PROVIDER_SITE_OTHER): Payer: Medicare Other

## 2019-10-07 ENCOUNTER — Ambulatory Visit (INDEPENDENT_AMBULATORY_CARE_PROVIDER_SITE_OTHER): Payer: Medicare Other | Admitting: Internal Medicine

## 2019-10-07 ENCOUNTER — Other Ambulatory Visit: Payer: Self-pay

## 2019-10-07 VITALS — BP 140/76 | HR 63 | Temp 98.3°F | Resp 16 | Ht 71.0 in | Wt 211.0 lb

## 2019-10-07 DIAGNOSIS — Z52008 Unspecified donor, other blood: Secondary | ICD-10-CM

## 2019-10-07 DIAGNOSIS — Z0000 Encounter for general adult medical examination without abnormal findings: Secondary | ICD-10-CM

## 2019-10-07 DIAGNOSIS — N320 Bladder-neck obstruction: Secondary | ICD-10-CM

## 2019-10-07 DIAGNOSIS — R202 Paresthesia of skin: Secondary | ICD-10-CM

## 2019-10-07 MED ORDER — GABAPENTIN 300 MG PO CAPS: 300.0000 mg | ORAL_CAPSULE | Freq: Three times a day (TID) | ORAL | 3 refills | Status: DC

## 2019-10-07 MED ORDER — FINASTERIDE 5 MG PO TABS: 5.0000 mg | ORAL_TABLET | Freq: Every day | ORAL | 3 refills | Status: DC

## 2019-10-07 NOTE — Assessment & Plan Note (Signed)
Not better - increase Gabapentin to 200-300 mg tid Neurol appt tomorrow

## 2019-10-07 NOTE — Progress Notes (Signed)
NEUROLOGY CONSULTATION NOTE  Mario Huerta MRN: [id number] DOB: [birth year]  Referring provider: Lew Dawes, MD Primary care provider: Lew Dawes, MD  Reason for consult:  neuropathy  HISTORY OF PRESENT ILLNESS: Mario Huerta is an 79 year old right-handed white male with HTN who presents for neuropathy.  History supplemented by referring provider's note.  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  Right foot is worse than left foot.  He describes numbness internally in both feet radiating up the the ankles.  No pain or tingling.  .  It has gotten worse over the past 3 years.  His feet used to get numb for 3 to 5 minutes upon standing.  Now, the numbness is worse and when he walks 200 to 300 yards, he starts feeling a shooting pain across his low back.  X-ray of right hip from 06/03/2015 showed stable total right hip arthroplasty and mild degenerative changes in the left hip and bilateral sacroiliac joints.  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  B12 level from 06/17/2018 was 683.  Labs from 08/06/2019 demonstrated TSH 2.22, normal SPEP pattern, and vitamin D 74.72, borderline elevated sed rate 23.  CBC and BMP were normal.  Lower extremity vascular studies from 08/19/2019 were normal.  No numbness of the hands/fingers.  Current medications:  Gabapentin 600mg  TID Past therapy:  acupuncture  PAST MEDICAL HISTORY: Past Medical History:  Diagnosis Date  . Arthritis   . Food impaction of esophagus   . Gastric ulcer   . Hypertension     PAST SURGICAL HISTORY: Past Surgical History:  Procedure Laterality Date  . BIOPSY  01/31/2018   Procedure: BIOPSY;  Surgeon: Ronnette Juniper, MD;  Location: [facility] ENDOSCOPY;  Service: Gastroenterology;;  . ESOPHAGOGASTRODUODENOSCOPY N/A 01/31/2018   Procedure:  ESOPHAGOGASTRODUODENOSCOPY (EGD);  Surgeon: Ronnette Juniper, MD;  Location: [place];  Service: Gastroenterology;  Laterality: N/A;  . FOREIGN BODY REMOVAL  01/31/2018   Procedure: FOREIGN BODY REMOVAL;  Surgeon: Ronnette Juniper, MD;  Location: [place];  Service: Gastroenterology;;  . HERNIA REPAIR    . MOUTH SURGERY  2019  . TOTAL HIP ARTHROPLASTY Right     MEDICATIONS: Current Outpatient Medications on File Prior to Visit  Medication Sig Dispense Refill  . amLODipine (NORVASC) 5 MG tablet TAKE 1 TABLET BY MOUTH DAILY 90 tablet 3  . aspirin EC 81 MG tablet Take 81 mg by mouth daily.    . Cholecalciferol (VITAMIN D3) 50 MCG (2000 UT) capsule Take 1 capsule (2,000 Units total) by mouth daily. 100 capsule 3  . gabapentin (NEURONTIN) 100 MG capsule Take 1 capsule (100 mg total) by mouth 3 (three) times daily as needed. 90 capsule 11  . Glucosamine-Chondroitin-MSM (TRIPLE FLEX PO) Take 1 tablet by mouth daily.    Marland Kitchen lisinopril-hydrochlorothiazide (ZESTORETIC) 20-12.5 MG tablet TAKE 1 TABLET BY MOUTH DAILY 90 tablet 3  . Omega-3 Fatty Acids (FISH OIL) 1000 MG CAPS Take 1,000 mg by mouth daily.    . pantoprazole (PROTONIX) 40 MG tablet Take 1 tablet (40 mg total) by mouth daily. 90 tablet 3  . UNABLE TO FIND Med Name: Neuropaquell     No current facility-administered medications on file prior to visit.    ALLERGIES: Allergies  Allergen Reactions  . Codeine Itching and Nausea And Vomiting  . Hydrocodone Nausea And Vomiting  . Oxycodone Nausea And  Vomiting  . Tramadol Itching and Nausea And Vomiting    FAMILY HISTORY: Family History  Problem Relation Age of Onset  . Colon cancer Neg Hx   . Esophageal cancer Neg Hx   . Pancreatic cancer Neg Hx   . Stomach cancer Neg Hx   . Liver disease Neg Hx     SOCIAL HISTORY: Social History   Socioeconomic History  . Marital status: Widowed    Spouse name: Not on file  . Number of children: Not on file  . Years of education: Not on file  .  Highest education level: Not on file  Occupational History  . Not on file  Tobacco Use  . Smoking status: Never Smoker  . Smokeless tobacco: Never Used  Substance and Sexual Activity  . Alcohol use: Yes    Comment: occasional  . Drug use: Never  . Sexual activity: Yes    Partners: Female  Other Topics Concern  . Not on file  Social History Narrative  . Not on file   Social Determinants of Health   Financial Resource Strain:   . Difficulty of Paying Living Expenses:   Food Insecurity:   . Worried About Charity fundraiser in the Last Year:   . Arboriculturist in the Last Year:   Transportation Needs:   . Film/video editor (Medical):   Marland Kitchen Lack of Transportation (Non-Medical):   Physical Activity:   . Days of Exercise per Week:   . Minutes of Exercise per Session:   Stress:   . Feeling of Stress :   Social Connections:   . Frequency of Communication with Friends and Family:   . Frequency of Social Gatherings with Friends and Family:   . Attends Religious Services:   . Active Member of Clubs or Organizations:   . Attends Archivist Meetings:   Marland Kitchen Marital Status:   Intimate Partner Violence:   . Fear of Current or Ex-Partner:   . Emotionally Abused:   Marland Kitchen Physically Abused:   . Sexually Abused:     REVIEW OF SYSTEMS: Constitutional: No fevers, chills, or sweats, no generalized fatigue, change in appetite Eyes: No visual changes, double vision, eye pain Ear, nose and throat: No hearing loss, ear pain, nasal congestion, sore throat Cardiovascular: No chest pain, palpitations Respiratory:  No shortness of breath at rest or with exertion, wheezes GastrointestinaI: No nausea, vomiting, diarrhea, abdominal pain, fecal incontinence Genitourinary:  No dysuria, urinary retention or frequency Musculoskeletal:  No neck pain, back pain Integumentary: No rash, pruritus, skin lesions Neurological: as above Psychiatric: No depression, insomnia, anxiety Endocrine: No  palpitations, fatigue, diaphoresis, mood swings, change in appetite, change in weight, increased thirst Hematologic/Lymphatic:  No purpura, petechiae. Allergic/Immunologic: no itchy/runny eyes, nasal congestion, recent allergic reactions, rashes  PHYSICAL EXAM: Blood pressure (!) 181/92, pulse 66, resp. rate 18, height 5' 11.5" (1.816 m), weight 215 lb (97.5 kg), SpO2 99 %. General: No acute distress.  Patient appears well-groomed.  Head:  Normocephalic/atraumatic Eyes:  fundi examined but not visualized Neck: supple, no paraspinal tenderness, full range of motion Back: No paraspinal tenderness Heart: regular rate and rhythm Lungs: Clear to auscultation bilaterally. Vascular: No carotid bruits. Neurological Exam: Mental status: alert and oriented to person, place, and time, recent and remote memory intact, fund of knowledge intact, attention and concentration intact, speech fluent and not dysarthric, language intact. Cranial nerves: CN I: not tested CN II: pupils equal, round and reactive to light, visual fields intact  CN III, IV, VI:  full range of motion, no nystagmus, no ptosis CN V: facial sensation intact CN VII: upper and lower face symmetric CN VIII: hearing intact CN IX, X: gag intact, uvula midline CN XI: sternocleidomastoid and trapezius muscles intact CN XII: tongue midline Bulk & Tone: normal, no fasciculations. Motor:  5-/5 right extensor hallucis longus.  Otherwise, 5/5 throughout Sensation:  Pinprick sensation reduced on dorsum of right foot and right anterior shin; vibration sensation reduced in both feet. Deep Tendon Reflexes:  2+ throughout except absent in ankles, toes downgoing. Finger to nose testing:  Without dysmetria.  Heel to shin:  Without dysmetria.  Gait:  Flexed posture, wide-based gait.  Able to turn with mild unsteadiness with tandem walk. Romberg negative.  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 Also cannot rule out  underlying idiopathic polyneuropathy Follow up with PCP regarding blood pressure.  Typically not this high but he rushed to get here.  PLAN: 1.  Will check NCV-EMG of lower extremities to further evaluate underlying polyneuropathy. 2.  At this time, he defers physical therapy for the back pain 3.  Follow up after testing.  Thank you for allowing me to take part in the care of this patient.  Metta Clines, DO  CC: Lew Dawes, MD

## 2019-10-07 NOTE — Progress Notes (Signed)
Subjective:  Patient ID: Mario Huerta, male    DOB: July 26, 1940  Age: 79 y.o. MRN: [id number]  CC: No chief complaint on file.   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 Not better on Gabapentin and B complex yet... C/o peeing 3 times per night...  Outpatient Medications Prior to Visit  Medication Sig Dispense Refill  . amLODipine (NORVASC) 5 MG tablet TAKE 1 TABLET BY MOUTH DAILY 90 tablet 3  . aspirin EC 81 MG tablet Take 81 mg by mouth daily.    . Cholecalciferol (VITAMIN D3) 50 MCG (2000 UT) capsule Take 1 capsule (2,000 Units total) by mouth daily. 100 capsule 3  . gabapentin (NEURONTIN) 100 MG capsule Take 1 capsule (100 mg total) by mouth 3 (three) times daily as needed. 90 capsule 11  . Glucosamine-Chondroitin-MSM (TRIPLE FLEX PO) Take 1 tablet by mouth daily.    Marland Kitchen lisinopril-hydrochlorothiazide (ZESTORETIC) 20-12.5 MG tablet TAKE 1 TABLET BY MOUTH DAILY 90 tablet 3  . Omega-3 Fatty Acids (FISH OIL) 1000 MG CAPS Take 1,000 mg by mouth daily.    . pantoprazole (PROTONIX) 40 MG tablet Take 1 tablet (40 mg total) by mouth daily. 90 tablet 3  . UNABLE TO FIND Med Name: Neuropaquell     No facility-administered medications prior to visit.    ROS: Review of Systems  Constitutional: Negative for appetite change, fatigue and unexpected weight change.  HENT: Negative for congestion, nosebleeds, sneezing, sore throat and trouble swallowing.   Eyes: Negative for itching and visual disturbance.  Respiratory: Negative for cough.   Cardiovascular: Negative for chest pain, palpitations and leg swelling.  Gastrointestinal: Negative for abdominal distention, blood in stool, diarrhea and nausea.  Genitourinary: Positive for urgency. Negative for frequency and hematuria.  Musculoskeletal: Negative for back pain, gait problem, joint swelling and neck pain.  Skin: Negative for rash.  Neurological: Positive for numbness. Negative for dizziness, tremors, speech difficulty and  weakness.  Psychiatric/Behavioral: Negative for agitation, dysphoric mood and sleep disturbance. The patient is not nervous/anxious.     Objective:  BP 140/76 (BP Location: Left Arm, Patient Position: Sitting, Cuff Size: Normal)   Pulse (!) 42   Temp 97.9 F (36.6 C) (Oral)   Ht 5' 10.5" (1.791 m)   Wt 211 lb (95.7 kg)   SpO2 96%   BMI 29.85 kg/m   BP Readings from Last 3 Encounters:  10/07/19 140/76  08/06/19 140/86  07/09/19 130/62    Wt Readings from Last 3 Encounters:  10/07/19 211 lb (95.7 kg)  08/06/19 209 lb (94.8 kg)  07/09/19 212 lb (96.2 kg)    Physical Exam Constitutional:      General: He is not in acute distress.    Appearance: He is well-developed.     Comments: NAD  Eyes:     Conjunctiva/sclera: Conjunctivae normal.     Pupils: Pupils are equal, round, and reactive to light.  Neck:     Thyroid: No thyromegaly.     Vascular: No JVD.  Cardiovascular:     Rate and Rhythm: Normal rate and regular rhythm.     Heart sounds: Normal heart sounds. No murmur. No friction rub. No gallop.   Pulmonary:     Effort: Pulmonary effort is normal. No respiratory distress.     Breath sounds: Normal breath sounds. No wheezing or rales.  Chest:     Chest wall: No tenderness.  Abdominal:     General: Bowel sounds are normal. There is no distension.  Palpations: Abdomen is soft. There is no mass.     Tenderness: There is no abdominal tenderness. There is no guarding or rebound.  Musculoskeletal:        General: No tenderness. Normal range of motion.     Cervical back: Normal range of motion.  Lymphadenopathy:     Cervical: No cervical adenopathy.  Skin:    General: Skin is warm and dry.     Findings: No rash.  Neurological:     Mental Status: He is alert and oriented to person, place, and time.     Cranial Nerves: No cranial nerve deficit.     Motor: No abnormal muscle tone.     Coordination: Coordination normal.     Gait: Gait normal.     Deep Tendon  Reflexes: Reflexes are normal and symmetric.  Psychiatric:        Behavior: Behavior normal.        Thought Content: Thought content normal.        Judgment: Judgment normal.     Lab Results  Component Value Date   WBC 5.6 08/06/2019   HGB 15.2 08/06/2019   HCT 43.5 08/06/2019   PLT 237.0 08/06/2019   GLUCOSE 93 08/06/2019   CHOL 150 08/06/2019   TRIG 94.0 08/06/2019   HDL 45.20 08/06/2019   LDLCALC 86 08/06/2019   ALT 22 08/06/2019   AST 34 08/06/2019   NA 138 08/06/2019   K 4.2 08/06/2019   CL 105 08/06/2019   CREATININE 0.93 08/06/2019   BUN 16 08/06/2019   CO2 27 08/06/2019   TSH 2.22 08/06/2019   PSA 1.26 08/06/2019    VAS [place] LE ART SEG MULTI (Segm&LE Reynauds)  Result Date: 08/19/2019 LOWER EXTREMITY DOPPLER STUDY High Risk Factors: Hypertension, no history of smoking. Other Factors: Peripheral neuropathy.                 Patient states that he has numbness and tingling in both lower                extremities. He denies any claudication and states he walks up to                100 yards with no issues.  Performing Technologist: Wilkie Aye RVT  Examination Guidelines: A complete evaluation includes at minimum, Doppler waveform signals and systolic blood pressure reading at the level of bilateral brachial, anterior tibial, and posterior tibial arteries, when vessel segments are accessible. Bilateral testing is considered an integral part of a complete examination. Photoelectric Plethysmograph (PPG) waveforms and toe systolic pressure readings are included as required and additional duplex testing as needed. Limited examinations for reoccurring indications may be performed as noted.  ABI Findings: +---------+------------------+-----+--------+--------+ Right    Rt Pressure (mmHg)IndexWaveformComment  +---------+------------------+-----+--------+--------+ Brachial 189                                     +---------+------------------+-----+--------+--------+ ATA       209               1.11                  +---------+------------------+-----+--------+--------+ PTA      205               1.08                  +---------+------------------+-----+--------+--------+ PERO  202               1.07                  +---------+------------------+-----+--------+--------+ Great Toe211               1.12                  +---------+------------------+-----+--------+--------+ +---------+------------------+-----+--------+-------+ Left     Lt Pressure (mmHg)IndexWaveformComment +---------+------------------+-----+--------+-------+ Brachial 177                                    +---------+------------------+-----+--------+-------+ ATA      218               1.15                 +---------+------------------+-----+--------+-------+ PTA      210               1.11                 +---------+------------------+-----+--------+-------+ PERO     211               1.12                 +---------+------------------+-----+--------+-------+ Great Toe193               1.02                 +---------+------------------+-----+--------+-------+ +-------+-----------+-----------+------------+------------+ ABI/TBIToday's ABIToday's TBIPrevious ABIPrevious TBI +-------+-----------+-----------+------------+------------+ Right  1.11       1.12                                +-------+-----------+-----------+------------+------------+ Left   1.15       1.02                                +-------+-----------+-----------+------------+------------+  Summary: Right: Resting right ankle-brachial index is within normal range. No evidence of significant right lower extremity arterial disease. The right toe-brachial index is normal. Left: Resting left ankle-brachial index is within normal range. No evidence of significant left lower extremity arterial disease. The left toe-brachial index is normal.  *See table(s) above for measurements and  observations.  Electronically signed by Larae Grooms MD on 08/19/2019 at 5:17:53 PM.    Final     Assessment & Plan:   There are no diagnoses linked to this encounter.   No orders of the defined types were placed in this encounter.    Follow-up: No follow-ups on file.  Walker Kehr, MD

## 2019-10-07 NOTE — Progress Notes (Addendum)
Subjective:   Mario Huerta is a 79 y.o. male who presents for Medicare Annual/Subsequent preventive examination.  Review of Systems:  No ROS. Medicare Wellness Visit. Additional risk factors are reflected in social history. Cardiac Risk Factors include: advanced age (>71men, >7 women);hypertension;male gender  Sleep Patterns: No sleep issues, feels rested on waking and sleeps 7-8 hours nightly. Home Safety/Smoke Alarms: Feels safe in home; uses home alarm. Smoke alarms in place. Living environment: 1-story home. Lives with daughter and her family, no needs for DME, good support system. Seat Belt Safety/Bike Helmet: Wears seat belt.    Objective:    Vitals: BP 140/76   Pulse 63   Temp 98.3 F (36.8 C)   Resp 16   Ht 5\' 11"  (1.803 m)   Wt 211 lb (95.7 kg)   SpO2 94%   BMI 29.43 kg/m   Body mass index is 29.43 kg/m.  Advanced Directives 10/07/2019 02/12/2018  Does Patient Have a Medical Advance Directive? Yes No  Type of Paramedic of [place];Living will -  Does patient want to make changes to medical advance directive? No - Patient declined -  Copy of [place] in Chart? No - copy requested -    Tobacco Social History   Tobacco Use  Smoking Status Never Smoker  Smokeless Tobacco Never Used     Counseling given: No   Clinical Intake:  Pre-visit preparation completed: Yes  Pain : No/denies pain Pain Score: 0-No pain     BMI - recorded: 29.85 Nutritional Status: BMI 25 -29 Overweight Nutritional Risks: None Diabetes: No  How often do you need to have someone help you when you read instructions, pamphlets, or other written materials from your doctor or pharmacy?: 1 - Never What is the last grade level you completed in school?: HSG  Interpreter Needed?: No  Information entered by :: Geovana Gebel N. Lowell Guitar, LPN  Past Medical History:  Diagnosis Date   Arthritis    Food impaction of esophagus    Gastric ulcer     Hypertension    Past Surgical History:  Procedure Laterality Date   BIOPSY  01/31/2018   Procedure: BIOPSY;  Surgeon: Ronnette Juniper, MD;  Location: [facility] ENDOSCOPY;  Service: Gastroenterology;;   ESOPHAGOGASTRODUODENOSCOPY N/A 01/31/2018   Procedure: ESOPHAGOGASTRODUODENOSCOPY (EGD);  Surgeon: Ronnette Juniper, MD;  Location: [facility];  Service: Gastroenterology;  Laterality: N/A;   FOREIGN BODY REMOVAL  01/31/2018   Procedure: FOREIGN BODY REMOVAL;  Surgeon: Ronnette Juniper, MD;  Location: [facility] ENDOSCOPY;  Service: Gastroenterology;;   HERNIA REPAIR     MOUTH SURGERY  2019   TOTAL HIP ARTHROPLASTY Right    Family History  Problem Relation Age of Onset   Colon cancer Neg Hx    Esophageal cancer Neg Hx    Pancreatic cancer Neg Hx    Stomach cancer Neg Hx    Liver disease Neg Hx    Social History   Socioeconomic History   Marital status: Widowed    Spouse name: Not on file   Number of children: Not on file   Years of education: Not on file   Highest education level: Not on file  Occupational History   Not on file  Tobacco Use   Smoking status: Never Smoker   Smokeless tobacco: Never Used  Substance and Sexual Activity   Alcohol use: Yes    Comment: occasional   Drug use: Never   Sexual activity: Yes    Partners: Female  Other Topics Concern   Not on file  Social History Narrative   Not on file   Social Determinants of Health   Financial Resource Strain:    Difficulty of Paying Living Expenses:   Food Insecurity:    Worried About Charity fundraiser in the Last Year:    Arboriculturist in the Last Year:   Transportation Needs:    Film/video editor (Medical):    Lack of Transportation (Non-Medical):   Physical Activity:    Days of Exercise per Week:    Minutes of Exercise per Session:   Stress:    Feeling of Stress :   Social Connections:    Frequency of Communication with Friends and Family:    Frequency of Social Gatherings with Friends and Family:    Attends  Religious Services:    Active Member of Clubs or Organizations:    Attends Archivist Meetings:    Marital Status:     Outpatient Encounter Medications as of 10/07/2019  Medication Sig   amLODipine (NORVASC) 5 MG tablet TAKE 1 TABLET BY MOUTH DAILY   aspirin EC 81 MG tablet Take 81 mg by mouth daily.   Cholecalciferol (VITAMIN D3) 50 MCG (2000 UT) capsule Take 1 capsule (2,000 Units total) by mouth daily.   gabapentin (NEURONTIN) 300 MG capsule Take 1 capsule (300 mg total) by mouth 3 (three) times daily.   Glucosamine-Chondroitin-MSM (TRIPLE FLEX PO) Take 1 tablet by mouth daily.   lisinopril-hydrochlorothiazide (ZESTORETIC) 20-12.5 MG tablet TAKE 1 TABLET BY MOUTH DAILY   Omega-3 Fatty Acids (FISH OIL) 1000 MG CAPS Take 1,000 mg by mouth daily.   pantoprazole (PROTONIX) 40 MG tablet Take 1 tablet (40 mg total) by mouth daily.   UNABLE TO FIND Med Name: Neuropaquell   No facility-administered encounter medications on file as of 10/07/2019.    Activities of Daily Living In your present state of health, do you have any difficulty performing the following activities: 10/07/2019  Hearing? N  Vision? N  Difficulty concentrating or making decisions? N  Walking or climbing stairs? N  Dressing or bathing? N  Doing errands, shopping? N  Preparing Food and eating ? N  Using the Toilet? N  In the past six months, have you accidently leaked urine? N  Do you have problems with loss of bowel control? N  Managing your Medications? N  Managing your Finances? N  Housekeeping or managing your Housekeeping? N  Some recent data might be hidden    Patient Care Team: Plotnikov, Evie Lacks, MD as PCP - General (Internal Medicine)   Assessment:   This is a routine wellness examination for [place].  Exercise Activities and Dietary recommendations Current Exercise Habits: The patient does not participate in regular exercise at present, Exercise limited by: Other - see comments(neuropathy in  feet)  Goals   None     Fall Risk Fall Risk  10/07/2019  Falls in the past year? 0  Number falls in past yr: 0  Injury with Fall? 0  Risk for fall due to : Orthopedic patient  Risk for fall due to: Comment back issues and neuropathy in both feet  Follow up Falls evaluation completed;Education provided   Is the patient's home free of loose throw rugs in walkways, pet beds, electrical cords, etc?   yes      Grab bars in the bathroom? no      Handrails on the stairs?   yes  Adequate lighting?   yes   Depression Screen PHQ 2/9 Scores 10/07/2019  PHQ - 2 Score 0    Cognitive Function     6CIT Screen 10/07/2019  What Year? 0 points  What month? 0 points  What time? 0 points  Count back from 20 0 points  Months in reverse 0 points  Repeat phrase 0 points  Total Score 0    Immunization History  Administered Date(s) Administered   Tdap 06/17/2018    Qualifies for Shingles Vaccine? Yes, will check with local pharmacy  Screening Tests Health Maintenance  Topic Date Due   COVID-19 Vaccine (1) Never done   PNA vac Low Risk Adult (1 of 2 - PCV13) Never done   INFLUENZA VACCINE  12/28/2019   TETANUS/TDAP  06/17/2028   Cancer Screenings: Lung: Low Dose CT Chest recommended if Age 74-80 years, 30 pack-year currently smoking OR have quit w/in 15years. Patient does not qualify. Colorectal: Yes    Plan:     Reviewed health maintenance screenings with patient today and relevant education, vaccines, and/or referrals were provided.    Continue doing brain stimulating activities (puzzles, reading, adult coloring books, staying active) to keep memory sharp.    Continue to eat heart healthy diet (full of fruits, vegetables, whole grains, lean protein, water--limit salt, fat, and sugar intake) and increase physical activity as tolerated.  I have personally reviewed and noted the following in the patient's chart:   Medical and social history Use of alcohol, tobacco or  illicit drugs  Current medications and supplements Functional ability and status Nutritional status Physical activity Advanced directives List of other physicians Hospitalizations, surgeries, and ER visits in previous 12 months Vitals Screenings to include cognitive, depression, and falls Referrals and appointments  In addition, I have reviewed and discussed with patient certain preventive protocols, quality metrics, and best practice recommendations. A written personalized care plan for preventive services as well as general preventive health recommendations were provided to patient.     Sheral Flow, LPN  [id number]  Nurse Health Advisor  Medical screening examination/treatment/procedure(s) were performed by non-physician practitioner and as supervising physician I was immediately available for consultation/collaboration.  I agree with above. Lew Dawes, MD

## 2019-10-07 NOTE — Assessment & Plan Note (Signed)
Ongoing

## 2019-10-07 NOTE — Assessment & Plan Note (Signed)
Worse Proscar

## 2019-10-08 ENCOUNTER — Ambulatory Visit: Payer: Medicare Other | Admitting: Neurology

## 2019-10-08 ENCOUNTER — Encounter: Payer: Self-pay | Admitting: Neurology

## 2019-10-08 VITALS — BP 181/92 | HR 66 | Resp 18 | Ht 71.5 in | Wt 215.0 lb

## 2019-10-08 DIAGNOSIS — G6289 Other specified polyneuropathies: Secondary | ICD-10-CM

## 2019-10-08 DIAGNOSIS — M48062 Spinal stenosis, lumbar region with neurogenic claudication: Secondary | ICD-10-CM | POA: Diagnosis not present

## 2019-10-08 NOTE — Patient Instructions (Signed)
We will check a nerve conduction study of the legs.  Follow up afterwards.

## 2020-01-06 ENCOUNTER — Ambulatory Visit (INDEPENDENT_AMBULATORY_CARE_PROVIDER_SITE_OTHER): Payer: Medicare Other | Admitting: Neurology

## 2020-01-06 ENCOUNTER — Telehealth: Payer: Self-pay

## 2020-01-06 ENCOUNTER — Other Ambulatory Visit: Payer: Self-pay

## 2020-01-06 DIAGNOSIS — G6289 Other specified polyneuropathies: Secondary | ICD-10-CM

## 2020-01-06 DIAGNOSIS — M5416 Radiculopathy, lumbar region: Secondary | ICD-10-CM

## 2020-01-06 NOTE — Telephone Encounter (Signed)
LMOVM to call us back in regards to EMG results

## 2020-01-06 NOTE — Telephone Encounter (Signed)
Patient's daughter Mario Huerta called back returning a call about the EMG results.

## 2020-01-06 NOTE — Telephone Encounter (Signed)
Telephone call to pt, Spoke to daughter Glendale Chard daughter of EMG results and recommendation of MRI.  Will Speak to her father and give [place] a call in regards to MRI.  Daughter also wanted [place] to let DR. Tomi Likens know her father decided to stop taking the Gabapentin. Per Daughter pt states it did not help. Pt had taken the medication for three days.

## 2020-01-06 NOTE — Telephone Encounter (Signed)
-----   Message from Alda Berthold, DO sent at 01/06/2020 10:20 AM EDT ----- Please inform patient that there is no signs of neuropathy on his nerve testing and his numbness is most likely stemming from his back.  If patient is agreeable, please order MRI lumbar spine wo contrast - diagnosis: neurogenic claudication, lumbar spinal stenosis. Thanks.

## 2020-01-06 NOTE — Procedures (Signed)
[facility]  [address]  [place], [place] [address] Tel: [phone] Fax:  [phone] Test Date:  01/06/2020  Patient: Mario Huerta DOB: [birth year] Physician: Narda Amber, DO  Sex: Male Height: 5\' 11"  Ref Phys: Metta Clines, D.O.  ID#: [id number] Temp: 32.0C Technician:    Patient Complaints: This is a 79 year old man referred for evaluation of bilateral leg numbness and gait difficulty.  NCV & EMG Findings: Extensive electrodiagnostic testing of the right lower extremity and additional studies of the left shows:  1. Bilateral sural and superficial peroneal sensory responses are within normal limits. 2. Bilateral peroneal and tibial motor responses are absent.  Bilateral tibial motor responses at the tibialis anterior are within normal limits. 3. Bilateral tibial H reflex studies are prolonged. 4. 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   Impression: 1. 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 2. There is no evidence of a sensorimotor polyneuropathy affecting the lower extremities.   ___________________________ Narda Amber, DO    Nerve Conduction Studies Anti Sensory Summary Table   Stim Site NR Peak (ms) Norm Peak (ms) P-T Amp (V) Norm P-T Amp  Left Sup Peroneal Anti Sensory (Ant Lat Mall)  32C  12 cm    3.1 <4.6 5.7 >3  Right Sup Peroneal Anti Sensory (Ant Lat Mall)  32C  12 cm    3.0 <4.6 5.3 >3  Left Sural Anti Sensory (Lat Mall)  32C  Calf    2.5 <4.6 11.5 >3  Right Sural Anti Sensory (Lat Mall)  32C  Calf    2.7 <4.6 11.8 >3   Motor Summary Table   Stim Site NR Onset (ms) Norm Onset (ms) O-P Amp (mV) Norm O-P Amp Site1 Site2 Delta-0 (ms) Dist (cm) Vel (m/s) Norm Vel (m/s)  Left Peroneal Motor (Ext Dig Brev)  32C  Ankle NR  <6.0  >2.5 B Fib Ankle  0.0  >40  B Fib NR     Poplt B Fib  0.0  >40   Poplt NR            Right Peroneal Motor (Ext Dig Brev)  32C  Ankle NR  <6.0  >2.5 B Fib Ankle  0.0  >40  B Fib NR     Poplt B Fib  0.0  >40  Poplt NR            Left Peroneal TA Motor (Tib Ant)  32C  Fib Head    3.2 <4.5 3.6 >3 Poplit Fib Head 1.7 10.0 59 >40  Poplit    4.9  3.2         Right Peroneal TA Motor (Tib Ant)  32C  Fib Head    2.7 <4.5 3.5 >3 Poplit Fib Head 2.0 10.0 50 >40  Poplit    4.7  3.1         Left Tibial Motor (Abd Hall Brev)  32C  Ankle NR  <6.0  >4 Knee Ankle  0.0  >40  Knee NR            Right Tibial Motor (Abd Hall Brev)  32C  Ankle NR  <6.0  >4 Knee Ankle  0.0  >40  Knee NR             H Reflex Studies   NR H-Lat (ms) Lat Norm (ms) L-R H-Lat (ms)  Left Tibial (Gastroc)  32C  41.50 <35 2.86  Right Tibial (Gastroc)  32C     44.35 <35 2.86   EMG   Side Muscle Ins Act Fibs Psw Fasc Number Recrt Dur Dur. Amp Amp. Poly Poly. Comment  Right AntTibialis Nml Nml Nml Nml 1- Rapid Some 1+ Some 1+ Nml Nml N/A  Right Gastroc Nml Nml Nml Nml Nml Nml Nml Nml Nml Nml Nml Nml N/A  Right RectFemoris Nml Nml Nml Nml Nml Nml Nml Nml Nml Nml Nml Nml N/A  Right GluteusMed Nml Nml Nml Nml 1- Rapid Few 1+ Few 1+ Nml Nml N/A  Right BicepsFemS Nml Nml Nml Nml Nml Nml Nml Nml Nml Nml Nml Nml N/A  Left BicepsFemS Nml Nml Nml Nml Nml Nml Nml Nml Nml Nml Nml Nml N/A  Left AntTibialis Nml Nml Nml Nml 1- Rapid Few 1+ Few 1+ Nml Nml N/A  Left Gastroc Nml Nml Nml Nml Nml Nml Nml Nml Nml Nml Nml Nml N/A  Left RectFemoris Nml Nml Nml Nml Nml Nml Nml Nml Nml Nml Nml Nml N/A  Left GluteusMed Nml Nml Nml Nml 1- Rapid Few 1+ Few 1+ Nml Nml N/A      Waveforms:

## 2020-02-26 ENCOUNTER — Ambulatory Visit (INDEPENDENT_AMBULATORY_CARE_PROVIDER_SITE_OTHER): Payer: Medicare Other

## 2020-02-26 ENCOUNTER — Encounter: Payer: Self-pay | Admitting: Internal Medicine

## 2020-02-26 ENCOUNTER — Ambulatory Visit (INDEPENDENT_AMBULATORY_CARE_PROVIDER_SITE_OTHER): Payer: Medicare Other | Admitting: Internal Medicine

## 2020-02-26 ENCOUNTER — Other Ambulatory Visit: Payer: Self-pay

## 2020-02-26 DIAGNOSIS — M545 Low back pain: Secondary | ICD-10-CM

## 2020-02-26 DIAGNOSIS — R202 Paresthesia of skin: Secondary | ICD-10-CM | POA: Diagnosis not present

## 2020-02-26 DIAGNOSIS — G6289 Other specified polyneuropathies: Secondary | ICD-10-CM | POA: Diagnosis not present

## 2020-02-26 DIAGNOSIS — M25551 Pain in right hip: Secondary | ICD-10-CM

## 2020-02-26 DIAGNOSIS — S73101A Unspecified sprain of right hip, initial encounter: Secondary | ICD-10-CM | POA: Diagnosis not present

## 2020-02-26 DIAGNOSIS — G8929 Other chronic pain: Secondary | ICD-10-CM

## 2020-02-26 MED ORDER — SILDENAFIL CITRATE 100 MG PO TABS: 100.0000 mg | ORAL_TABLET | Freq: Every day | ORAL | 5 refills | Status: DC | PRN

## 2020-02-26 NOTE — Assessment & Plan Note (Signed)
try Lion's Mane Mushroom extract or capsules for neuropathy (numbness)

## 2020-02-26 NOTE — Assessment & Plan Note (Signed)
Likely related to spinal stenosis

## 2020-02-26 NOTE — Assessment & Plan Note (Signed)
try Lion's Mane Mushroom extract or capsules for neuropathy (numbness) On B12

## 2020-02-26 NOTE — Patient Instructions (Signed)
You can try Lion's Mane Mushroom extract or capsules for neuropathy (numbness)

## 2020-02-26 NOTE — Assessment & Plan Note (Signed)
R hip pain since 10 d ago when he twisted it - much better now. No pain today. THR R 8 years ago Hip X ray

## 2020-02-26 NOTE — Progress Notes (Signed)
Subjective:  Patient ID: Mario Huerta, male    DOB: [birth year]  Age: 79 y.o. MRN: [id number]  CC: No chief complaint on file.   HPI Mario Huerta presents for R hip pain since 10 d ago when he twisted it - much better now THR R 8 years ago Numbness is better on B12 LBP - no change   Outpatient Medications Prior to Visit  Medication Sig Dispense Refill  . amLODipine (NORVASC) 5 MG tablet TAKE 1 TABLET BY MOUTH DAILY 90 tablet 3  . aspirin EC 81 MG tablet Take 81 mg by mouth daily.    . Cholecalciferol (VITAMIN D3) 50 MCG (2000 UT) capsule Take 1 capsule (2,000 Units total) by mouth daily. 100 capsule 3  . finasteride (PROSCAR) 5 MG tablet Take 1 tablet (5 mg total) by mouth daily. 100 tablet 3  . gabapentin (NEURONTIN) 300 MG capsule Take 1 capsule (300 mg total) by mouth 3 (three) times daily. 90 capsule 3  . Glucosamine-Chondroitin-MSM (TRIPLE FLEX PO) Take 1 tablet by mouth daily.    Marland Kitchen lisinopril-hydrochlorothiazide (ZESTORETIC) 20-12.5 MG tablet TAKE 1 TABLET BY MOUTH DAILY 90 tablet 3  . Omega-3 Fatty Acids (FISH OIL) 1000 MG CAPS Take 1,000 mg by mouth daily.    . pantoprazole (PROTONIX) 40 MG tablet Take 1 tablet (40 mg total) by mouth daily. 90 tablet 3  . UNABLE TO FIND Med Name: Neuropaquell     No facility-administered medications prior to visit.    ROS: Review of Systems  Constitutional: Negative for appetite change, fatigue and unexpected weight change.  HENT: Negative for congestion, nosebleeds, sneezing, sore throat and trouble swallowing.   Eyes: Negative for itching and visual disturbance.  Respiratory: Negative for cough.   Cardiovascular: Negative for chest pain, palpitations and leg swelling.  Gastrointestinal: Negative for abdominal distention, blood in stool, diarrhea and nausea.  Genitourinary: Negative for frequency and hematuria.  Musculoskeletal: Positive for back pain. Negative for gait problem, joint swelling and neck pain.  Skin: Negative for  rash.  Neurological: Negative for dizziness, tremors, speech difficulty and weakness.  Psychiatric/Behavioral: Negative for agitation, dysphoric mood and sleep disturbance. The patient is not nervous/anxious.     Objective:  BP 140/82 (BP Location: Right Arm, Patient Position: Sitting, Cuff Size: Large)   Pulse (!) 59   Temp 97.9 F (36.6 C) (Oral)   Ht 5' 11.5" (1.816 m)   Wt 216 lb (98 kg)   SpO2 97%   BMI 29.71 kg/m   BP Readings from Last 3 Encounters:  02/26/20 140/82  10/08/19 (!) 181/92  10/07/19 140/76    Wt Readings from Last 3 Encounters:  02/26/20 216 lb (98 kg)  10/08/19 215 lb (97.5 kg)  10/07/19 211 lb (95.7 kg)    Physical Exam Constitutional:      General: He is not in acute distress.    Appearance: He is well-developed.     Comments: NAD  Eyes:     Conjunctiva/sclera: Conjunctivae normal.     Pupils: Pupils are equal, round, and reactive to light.  Neck:     Thyroid: No thyromegaly.     Vascular: No JVD.  Cardiovascular:     Rate and Rhythm: Normal rate and regular rhythm.     Heart sounds: Normal heart sounds. No murmur heard.  No friction rub. No gallop.   Pulmonary:     Effort: Pulmonary effort is normal. No respiratory distress.     Breath sounds: Normal breath sounds. No  wheezing or rales.  Chest:     Chest wall: No tenderness.  Abdominal:     General: Bowel sounds are normal. There is no distension.     Palpations: Abdomen is soft. There is no mass.     Tenderness: There is no abdominal tenderness. There is no guarding or rebound.  Musculoskeletal:        General: No tenderness. Normal range of motion.     Cervical back: Normal range of motion.  Lymphadenopathy:     Cervical: No cervical adenopathy.  Skin:    General: Skin is warm and dry.     Findings: No rash.  Neurological:     Mental Status: He is alert and oriented to person, place, and time.     Cranial Nerves: No cranial nerve deficit.     Motor: No abnormal muscle tone.      Coordination: Coordination normal.     Gait: Gait normal.     Deep Tendon Reflexes: Reflexes are normal and symmetric.  Psychiatric:        Behavior: Behavior normal.        Thought Content: Thought content normal.        Judgment: Judgment normal.   B hips NT w/ROM Gait - arthritic A/o/c  Lab Results  Component Value Date   WBC 5.6 08/06/2019   HGB 15.2 08/06/2019   HCT 43.5 08/06/2019   PLT 237.0 08/06/2019   GLUCOSE 93 08/06/2019   CHOL 150 08/06/2019   TRIG 94.0 08/06/2019   HDL 45.20 08/06/2019   LDLCALC 86 08/06/2019   ALT 22 08/06/2019   AST 34 08/06/2019   NA 138 08/06/2019   K 4.2 08/06/2019   CL 105 08/06/2019   CREATININE 0.93 08/06/2019   BUN 16 08/06/2019   CO2 27 08/06/2019   TSH 2.22 08/06/2019   PSA 1.26 08/06/2019    VAS [place] LE ART SEG MULTI (Segm&LE Reynauds)  Result Date: 08/19/2019 LOWER EXTREMITY DOPPLER STUDY High Risk Factors: Hypertension, no history of smoking. Other Factors: Peripheral neuropathy.                 Patient states that he has numbness and tingling in both lower                extremities. He denies any claudication and states he walks up to                100 yards with no issues.  Performing Technologist: Wilkie Aye RVT  Examination Guidelines: A complete evaluation includes at minimum, Doppler waveform signals and systolic blood pressure reading at the level of bilateral brachial, anterior tibial, and posterior tibial arteries, when vessel segments are accessible. Bilateral testing is considered an integral part of a complete examination. Photoelectric Plethysmograph (PPG) waveforms and toe systolic pressure readings are included as required and additional duplex testing as needed. Limited examinations for reoccurring indications may be performed as noted.  ABI Findings: +---------+------------------+-----+--------+--------+ Right    Rt Pressure (mmHg)IndexWaveformComment  +---------+------------------+-----+--------+--------+  Brachial 189                                     +---------+------------------+-----+--------+--------+ ATA      209               1.11                  +---------+------------------+-----+--------+--------+ PTA  205               1.08                  +---------+------------------+-----+--------+--------+ PERO     202               1.07                  +---------+------------------+-----+--------+--------+ Great Toe211               1.12                  +---------+------------------+-----+--------+--------+ +---------+------------------+-----+--------+-------+ Left     Lt Pressure (mmHg)IndexWaveformComment +---------+------------------+-----+--------+-------+ Brachial 177                                    +---------+------------------+-----+--------+-------+ ATA      218               1.15                 +---------+------------------+-----+--------+-------+ PTA      210               1.11                 +---------+------------------+-----+--------+-------+ PERO     211               1.12                 +---------+------------------+-----+--------+-------+ Great Toe193               1.02                 +---------+------------------+-----+--------+-------+ +-------+-----------+-----------+------------+------------+ ABI/TBIToday's ABIToday's TBIPrevious ABIPrevious TBI +-------+-----------+-----------+------------+------------+ Right  1.11       1.12                                +-------+-----------+-----------+------------+------------+ Left   1.15       1.02                                +-------+-----------+-----------+------------+------------+  Summary: Right: Resting right ankle-brachial index is within normal range. No evidence of significant right lower extremity arterial disease. The right toe-brachial index is normal. Left: Resting left ankle-brachial index is within normal range. No evidence of significant  left lower extremity arterial disease. The left toe-brachial index is normal.  *See table(s) above for measurements and observations.  Electronically signed by Larae Grooms MD on 08/19/2019 at 5:17:53 PM.    Final     Assessment & Plan:   There are no diagnoses linked to this encounter.   No orders of the defined types were placed in this encounter.    Follow-up: No follow-ups on file.  Walker Kehr, MD

## 2020-05-21 ENCOUNTER — Other Ambulatory Visit: Payer: Self-pay | Admitting: Internal Medicine

## 2020-07-07 ENCOUNTER — Encounter: Payer: Self-pay | Admitting: Gastroenterology

## 2020-08-16 ENCOUNTER — Other Ambulatory Visit: Payer: Self-pay | Admitting: Gastroenterology

## 2020-08-27 ENCOUNTER — Other Ambulatory Visit: Payer: Self-pay | Admitting: Gastroenterology

## 2020-09-01 ENCOUNTER — Other Ambulatory Visit: Payer: Self-pay

## 2020-09-01 ENCOUNTER — Other Ambulatory Visit: Payer: Self-pay | Admitting: Gastroenterology

## 2020-09-01 MED ORDER — PANTOPRAZOLE SODIUM 40 MG PO TBEC: 40.0000 mg | DELAYED_RELEASE_TABLET | Freq: Every day | ORAL | 0 refills | Status: DC

## 2020-09-16 ENCOUNTER — Other Ambulatory Visit: Payer: Self-pay

## 2020-09-16 ENCOUNTER — Encounter: Payer: Self-pay | Admitting: Gastroenterology

## 2020-09-16 ENCOUNTER — Ambulatory Visit (INDEPENDENT_AMBULATORY_CARE_PROVIDER_SITE_OTHER): Payer: Medicare Other | Admitting: Gastroenterology

## 2020-09-16 VITALS — BP 130/80 | HR 64 | Ht 69.0 in | Wt 211.4 lb

## 2020-09-16 DIAGNOSIS — K219 Gastro-esophageal reflux disease without esophagitis: Secondary | ICD-10-CM

## 2020-09-16 DIAGNOSIS — K2270 Barrett's esophagus without dysplasia: Secondary | ICD-10-CM

## 2020-09-16 MED ORDER — PANTOPRAZOLE SODIUM 40 MG PO TBEC: 40.0000 mg | DELAYED_RELEASE_TABLET | Freq: Every day | ORAL | 11 refills | Status: DC

## 2020-09-16 NOTE — Progress Notes (Signed)
    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 He has no active reflux symptoms and denies dysphagia. He has no gastrointestinal complaints.   Current Medications, Allergies, Past Medical History, Past Surgical History, Family History and Social History were reviewed in [employer] record.   Physical Exam: General: Well developed, well nourished, no acute distress Head: Normocephalic and atraumatic Eyes: Sclerae anicteric, EOMI Ears: Normal auditory acuity Mouth: Not examined, mask on during Covid-19 pandemic Lungs: Clear throughout to auscultation Heart: Regular rate and rhythm; no murmurs, rubs or bruits Abdomen: Soft, non tender and non distended. No masses, hepatosplenomegaly or hernias noted. Normal Bowel sounds Rectal: Not done Musculoskeletal: Symmetrical with no gross deformities  Pulses:  Normal pulses noted Extremities: No clubbing, cyanosis, edema or deformities noted Neurological: Alert oriented x 4, grossly nonfocal Psychological:  Alert and cooperative. Normal mood and affect   Assessment and Recommendations:  1. 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 History of an esophageal stricture. He is due for surveillance EGD. Given that he enjoys very good health at age 80 we will proceed with surveillance EGD. Continue pantoprazole 40 mg po qd and follow antireflux measures. The risks (including bleeding, perforation, infection, missed lesions, medication reactions and possible hospitalization or surgery if complications occur), benefits, and alternatives to endoscopy with possible biopsy and possible dilation were discussed with the patient and they consent to proceed.

## 2020-09-16 NOTE — Patient Instructions (Signed)
We have sent the following medications to your pharmacy for you to pick up at your convenience: pantoprazole.   You have been scheduled for an endoscopy. Please follow written instructions given to you at your visit today. If you use inhalers (even only as needed), please bring them with you on the day of your procedure.  Normal BMI (Body Mass Index- based on height and weight) is between 23 and 30. Your BMI today is Body mass index is 31.21 kg/m. Marland Kitchen Please consider follow up  regarding your BMI with your Primary Care Provider.  Thank you for choosing me and [facility].  Pricilla Riffle. Dagoberto Ligas., MD., Marval Regal

## 2020-09-29 ENCOUNTER — Other Ambulatory Visit: Payer: Self-pay | Admitting: Internal Medicine

## 2020-10-06 ENCOUNTER — Other Ambulatory Visit: Payer: Self-pay | Admitting: Internal Medicine

## 2020-10-14 ENCOUNTER — Encounter: Payer: Medicare Other | Admitting: Gastroenterology

## 2020-10-23 ENCOUNTER — Other Ambulatory Visit: Payer: Self-pay | Admitting: Gastroenterology

## 2020-11-30 ENCOUNTER — Other Ambulatory Visit: Payer: Self-pay

## 2020-11-30 ENCOUNTER — Ambulatory Visit (INDEPENDENT_AMBULATORY_CARE_PROVIDER_SITE_OTHER): Payer: Medicare Other

## 2020-11-30 VITALS — BP 140/80 | HR 58 | Temp 97.8°F | Ht 69.0 in | Wt 209.6 lb

## 2020-11-30 DIAGNOSIS — Z0000 Encounter for general adult medical examination without abnormal findings: Secondary | ICD-10-CM | POA: Diagnosis not present

## 2020-11-30 NOTE — Patient Instructions (Signed)
[name] , Thank you for taking time to come for your Medicare Wellness Visit. I appreciate your ongoing commitment to your health goals. Please review the following plan we discussed and let me know if I can assist you in the future.   Screening recommendations/referrals: Colonoscopy: never done Recommended yearly ophthalmology/optometry visit for glaucoma screening and checkup Recommended yearly dental visit for hygiene and checkup  Vaccinations: Influenza vaccine: declined Pneumococcal vaccine: 01/22/2018; declined second dose Tdap vaccine: 06/17/2018; due every 10 years Shingles vaccine: declined   Covid-19: declined  Advanced directives: Please bring a copy of your health care power of attorney and living will to the office at your convenience.  Conditions/risks identified: To maintain my current health status by continuing to eat healthy, stay physically active and socially active.  Next appointment: Please schedule your next Medicare Wellness Visit with your Nurse Health Advisor in 1 year by calling [phone].  Preventive Care 27 Years and Older, Male Preventive care refers to lifestyle choices and visits with your health care provider that can promote health and wellness. What does preventive care include? A yearly physical exam. This is also called an annual well check. Dental exams once or twice a year. Routine eye exams. Ask your health care provider how often you should have your eyes checked. Personal lifestyle choices, including: Daily care of your teeth and gums. Regular physical activity. Eating a healthy diet. Avoiding tobacco and drug use. Limiting alcohol use. Practicing safe sex. Taking low doses of aspirin every day. Taking vitamin and mineral supplements as recommended by your health care provider. What happens during an annual well check? The services and screenings done by your health care provider during your annual well check will depend on your age,  overall health, lifestyle risk factors, and family history of disease. Counseling  Your health care provider may ask you questions about your: Alcohol use. Tobacco use. Drug use. Emotional well-being. Home and relationship well-being. Sexual activity. Eating habits. History of falls. Memory and ability to understand (cognition). Work and work Statistician. Screening  You may have the following tests or measurements: Height, weight, and BMI. Blood pressure. Lipid and cholesterol levels. These may be checked every 5 years, or more frequently if you are over 39 years old. Skin check. Lung cancer screening. You may have this screening every year starting at age 69 if you have a 30-pack-year history of smoking and currently smoke or have quit within the past 15 years. Fecal occult blood test (FOBT) of the stool. You may have this test every year starting at age 44. Flexible sigmoidoscopy or colonoscopy. You may have a sigmoidoscopy every 5 years or a colonoscopy every 10 years starting at age 33. Prostate cancer screening. Recommendations will vary depending on your family history and other risks. Hepatitis C blood test. Hepatitis B blood test. Sexually transmitted disease (STD) testing. Diabetes screening. This is done by checking your blood sugar (glucose) after you have not eaten for a while (fasting). You may have this done every 1-3 years. Abdominal aortic aneurysm (AAA) screening. You may need this if you are a current or former smoker. Osteoporosis. You may be screened starting at age 4 if you are at high risk. Talk with your health care provider about your test results, treatment options, and if necessary, the need for more tests. Vaccines  Your health care provider may recommend certain vaccines, such as: Influenza vaccine. This is recommended every year. Tetanus, diphtheria, and acellular pertussis (Tdap, Td) vaccine. You may need  a Td booster every 10 years. Zoster vaccine.  You may need this after age 20. Pneumococcal 13-valent conjugate (PCV13) vaccine. One dose is recommended after age 29. Pneumococcal polysaccharide (PPSV23) vaccine. One dose is recommended after age 70. Talk to your health care provider about which screenings and vaccines you need and how often you need them. This information is not intended to replace advice given to you by your health care provider. Make sure you discuss any questions you have with your health care provider. Document Released: 06/11/2015 Document Revised: 02/02/2016 Document Reviewed: 03/16/2015 Elsevier Interactive Patient Education  2017 [employer] Prevention in the Home Falls can cause injuries. They can happen to people of all ages. There are many things you can do to make your home safe and to help prevent falls. What can I do on the outside of my home? Regularly fix the edges of walkways and driveways and fix any cracks. Remove anything that might make you trip as you walk through a door, such as a raised step or threshold. Trim any bushes or trees on the path to your home. Use bright outdoor lighting. Clear any walking paths of anything that might make someone trip, such as rocks or tools. Regularly check to see if handrails are loose or broken. Make sure that both sides of any steps have handrails. Any raised decks and porches should have guardrails on the edges. Have any leaves, snow, or ice cleared regularly. Use sand or salt on walking paths during winter. Clean up any spills in your garage right away. This includes oil or grease spills. What can I do in the bathroom? Use night lights. Install grab bars by the toilet and in the tub and shower. Do not use towel bars as grab bars. Use non-skid mats or decals in the tub or shower. If you need to sit down in the shower, use a plastic, non-slip stool. Keep the floor dry. Clean up any water that spills on the floor as soon as it happens. Remove soap  buildup in the tub or shower regularly. Attach bath mats securely with double-sided non-slip rug tape. Do not have throw rugs and other things on the floor that can make you trip. What can I do in the bedroom? Use night lights. Make sure that you have a light by your bed that is easy to reach. Do not use any sheets or blankets that are too big for your bed. They should not hang down onto the floor. Have a firm chair that has side arms. You can use this for support while you get dressed. Do not have throw rugs and other things on the floor that can make you trip. What can I do in the kitchen? Clean up any spills right away. Avoid walking on wet floors. Keep items that you use a lot in easy-to-reach places. If you need to reach something above you, use a strong step stool that has a grab bar. Keep electrical cords out of the way. Do not use floor polish or wax that makes floors slippery. If you must use wax, use non-skid floor wax. Do not have throw rugs and other things on the floor that can make you trip. What can I do with my stairs? Do not leave any items on the stairs. Make sure that there are handrails on both sides of the stairs and use them. Fix handrails that are broken or loose. Make sure that handrails are as long as the stairways. Check  any carpeting to make sure that it is firmly attached to the stairs. Fix any carpet that is loose or worn. Avoid having throw rugs at the top or bottom of the stairs. If you do have throw rugs, attach them to the floor with carpet tape. Make sure that you have a light switch at the top of the stairs and the bottom of the stairs. If you do not have them, ask someone to add them for you. What else can I do to help prevent falls? Wear shoes that: Do not have high heels. Have rubber bottoms. Are comfortable and fit you well. Are closed at the toe. Do not wear sandals. If you use a stepladder: Make sure that it is fully opened. Do not climb a closed  stepladder. Make sure that both sides of the stepladder are locked into place. Ask someone to hold it for you, if possible. Clearly mark and make sure that you can see: Any grab bars or handrails. First and last steps. Where the edge of each step is. Use tools that help you move around (mobility aids) if they are needed. These include: Canes. Walkers. Scooters. Crutches. Turn on the lights when you go into a dark area. Replace any light bulbs as soon as they burn out. Set up your furniture so you have a clear path. Avoid moving your furniture around. If any of your floors are uneven, fix them. If there are any pets around you, be aware of where they are. Review your medicines with your doctor. Some medicines can make you feel dizzy. This can increase your chance of falling. Ask your doctor what other things that you can do to help prevent falls. This information is not intended to replace advice given to you by your health care provider. Make sure you discuss any questions you have with your health care provider. Document Released: 03/11/2009 Document Revised: 10/21/2015 Document Reviewed: 06/19/2014 Elsevier Interactive Patient Education  2017 [employer].

## 2020-11-30 NOTE — Progress Notes (Addendum)
Subjective:   Mario Huerta is a 80 y.o. male who presents for Medicare Annual/Subsequent preventive examination.  Review of Systems     Cardiac Risk Factors include: advanced age (>68men, >35 women);hypertension;male gender;obesity (BMI >30kg/m2)     Objective:    Today's Vitals   11/30/20 1101  BP: 140/80  Pulse: (!) 58  Temp: 97.8 F (36.6 C)  SpO2: 95%  Weight: 209 lb 9.6 oz (95.1 kg)  Height: 5\' 9"  (1.753 m)  PainSc: 0-No pain   Body mass index is 30.95 kg/m.  Advanced Directives 11/30/2020 10/08/2019 10/07/2019 02/12/2018  Does Patient Have a Medical Advance Directive? Yes Yes Yes No  Type of Advance Directive Living will;Healthcare Power of [place];Living will -  Does patient want to make changes to medical advance directive? No - Patient declined - No - Patient declined -  Copy of [place] in Chart? No - copy requested - No - copy requested -    Current Medications (verified) Outpatient Encounter Medications as of 11/30/2020  Medication Sig   AMBULATORY NON FORMULARY MEDICATION [employer] Mushroom capsules 2 capsules by mouth daily   amLODipine (NORVASC) 5 MG tablet TAKE 1 TABLET BY MOUTH DAILY   aspirin EC 81 MG tablet Take 81 mg by mouth daily.   Cholecalciferol (VITAMIN D3) 50 MCG (2000 UT) capsule Take 1 capsule (2,000 Units total) by mouth daily.   finasteride (PROSCAR) 5 MG tablet TAKE 1 TABLET DAILY. MUST SEE PROVIDER BEFORE ANY MORE REFILLS.   gabapentin (NEURONTIN) 300 MG capsule Take 1 capsule (300 mg total) by mouth 3 (three) times daily.   Glucosamine-Chondroitin-MSM (TRIPLE FLEX PO) Take 1 tablet by mouth daily.   lisinopril-hydrochlorothiazide (ZESTORETIC) 20-12.5 MG tablet TAKE 1 TABLET BY MOUTH DAILY   Multiple Vitamins-Minerals (ONE-A-DAY MENS 50+) TABS Take 1 tablet by mouth every Friday.   Omega-3 Fatty Acids (FISH OIL) 1000 MG CAPS Take 1,000 mg by mouth daily.   pantoprazole (PROTONIX) 40 MG  tablet TAKE 1 TABLET(40 MG) BY MOUTH DAILY   sildenafil (VIAGRA) 100 MG tablet Take 1 tablet (100 mg total) by mouth daily as needed for erectile dysfunction.   Specialty Vitamins Products (MAGNESIUM, AMINO ACID CHELATE,) 133 MG tablet Take 1 tablet by mouth 2 (two) times daily.   UNABLE TO FIND Med Name: Neuropaquell   VITAMIN E PO Take 1 tablet by mouth daily.   No facility-administered encounter medications on file as of 11/30/2020.    Allergies (verified) Codeine, Hydrocodone, Oxycodone, and Tramadol   History: Past Medical History:  Diagnosis Date   Arthritis    Food impaction of esophagus    Gastric ulcer    Hypertension    Past Surgical History:  Procedure Laterality Date   BIOPSY  01/31/2018   Procedure: BIOPSY;  Surgeon: Ronnette Juniper, MD;  Location: [facility] ENDOSCOPY;  Service: Gastroenterology;;   ESOPHAGOGASTRODUODENOSCOPY N/A 01/31/2018   Procedure: ESOPHAGOGASTRODUODENOSCOPY (EGD);  Surgeon: Ronnette Juniper, MD;  Location: [facility];  Service: Gastroenterology;  Laterality: N/A;   FOREIGN BODY REMOVAL  01/31/2018   Procedure: FOREIGN BODY REMOVAL;  Surgeon: Ronnette Juniper, MD;  Location: [facility] ENDOSCOPY;  Service: Gastroenterology;;   HERNIA REPAIR     MOUTH SURGERY  2019   TOTAL HIP ARTHROPLASTY Right    Family History  Problem Relation Age of Onset   Colon cancer Neg Hx    Esophageal cancer Neg Hx    Pancreatic cancer Neg Hx    Stomach cancer Neg  Hx    Liver disease Neg Hx    Social History   Socioeconomic History   Marital status: Widowed    Spouse name: Not on file   Number of children: Not on file   Years of education: Not on file   Highest education level: Not on file  Occupational History   Not on file  Tobacco Use   Smoking status: Never   Smokeless tobacco: Never  Vaping Use   Vaping Use: Never used  Substance and Sexual Activity   Alcohol use: Yes    Comment: occasional   Drug use: Never   Sexual activity: Yes    Partners: Female  Other Topics Concern    Not on file  Social History Narrative   Right handed   One story home   Drinks caffeine coffee qam   Social Determinants of Health   Financial Resource Strain: Low Risk    Difficulty of Paying Living Expenses: Not hard at all  Food Insecurity: No Food Insecurity   Worried About Charity fundraiser in the Last Year: Never true   [place] in the Last Year: Never true  Transportation Needs: No Transportation Needs   Lack of Transportation (Medical): No   Lack of Transportation (Non-Medical): No  Physical Activity: Sufficiently Active   Days of Exercise per Week: 5 days   Minutes of Exercise per Session: 30 min  Stress: No Stress Concern Present   Feeling of Stress : Not at all  Social Connections: Socially Isolated   Frequency of Communication with Friends and Family: More than three times a week   Frequency of Social Gatherings with Friends and Family: Once a week   Attends Religious Services: Never   Marine scientist or Organizations: No   Attends Archivist Meetings: Never   Marital Status: Widowed    Tobacco Counseling Counseling given: Not Answered   Clinical Intake:  Pre-visit preparation completed: Yes  Pain : No/denies pain Pain Score: 0-No pain     BMI - recorded: 30.95 Nutritional Status: BMI > 30  Obese Nutritional Risks: None Diabetes: No  How often do you need to have someone help you when you read instructions, pamphlets, or other written materials from your doctor or pharmacy?: 1 - Never What is the last grade level you completed in school?: High School Graduate  Diabetic? no  Interpreter Needed?: No  Information entered by :: Lisette Abu, LPN   Activities of Daily Living In your present state of health, do you have any difficulty performing the following activities: 11/30/2020  Hearing? N  Vision? N  Difficulty concentrating or making decisions? N  Walking or climbing stairs? N  Dressing or bathing? N  Doing  errands, shopping? N  Preparing Food and eating ? N  Using the Toilet? N  In the past six months, have you accidently leaked urine? N  Do you have problems with loss of bowel control? N  Managing your Medications? N  Managing your Finances? N  Housekeeping or managing your Housekeeping? N  Some recent data might be hidden    Patient Care Team: Plotnikov, Evie Lacks, MD as PCP - General (Internal Medicine) Pieter Partridge, DO as Consulting Physician (Neurology)  Indicate any recent Medical Services you may have received from other than [facility] providers in the past year (date may be approximate).     Assessment:   This is a routine wellness examination for Mario Huerta.  Hearing/Vision screen Hearing Screening -  Comments:: Patient denied any hearing difficulty. Vision Screening - Comments:: Patient does not wear corrective lenses.  [facility] exam done by [facility].  Dietary issues and exercise activities discussed: Current Exercise Habits: Home exercise routine, Type of exercise: walking;Other - see comments (push ups, yard work), Time (Minutes): 30, Frequency (Times/Week): 5, Weekly Exercise (Minutes/Week): 150, Intensity: Mild, Exercise limited by: orthopedic condition(s)   Goals Addressed               This Visit's Progress     Patient Stated (pt-stated)        To maintain my current health status by continuing to eat healthy, stay physically active and socially active.       Depression Screen PHQ 2/9 Scores 11/30/2020 10/08/2019 10/07/2019  PHQ - 2 Score 0 0 0    Fall Risk Fall Risk  11/30/2020 10/08/2019 10/07/2019  Falls in the past year? 0 0 0  Number falls in past yr: 0 0 0  Injury with Fall? 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 Yes  If so, are there any without handrails? No  Home free of loose throw rugs in walkways, pet beds, electrical cords, etc? Yes  Adequate lighting in your home to reduce risk of falls? Yes   ASSISTIVE DEVICES UTILIZED TO PREVENT FALLS:  Life alert? No  Use of a cane, walker or w/c? No  Grab bars in the bathroom? No  Shower chair or bench in shower? No  Elevated toilet seat or a handicapped toilet? No   TIMED UP AND GO:  Was the test performed? Yes .  Length of time to ambulate 10 feet: 7 sec.   Gait steady and fast without use of assistive device  Cognitive Function: Normal cognitive status assessed by direct observation by this Nurse Health Advisor. No abnormalities found.       6CIT Screen 10/07/2019  What Year? 0 points  What month? 0 points  What time? 0 points  Count back from 20 0 points  Months in reverse 0 points  Repeat phrase 0 points  Total Score 0    Immunizations Immunization History  Administered Date(s) Administered   Tdap 06/17/2018    TDAP status: Up to date  Flu Vaccine status: Declined, Education has been provided regarding the importance of this vaccine but patient still declined. Advised may receive this vaccine at local pharmacy or Health Dept. Aware to provide a copy of the vaccination record if obtained from local pharmacy or Health Dept. Verbalized acceptance and understanding.  Pneumococcal vaccine status: Declined,  Education has been provided regarding the importance of this vaccine but patient still declined. Advised may receive this vaccine at local pharmacy or Health Dept. Aware to provide a copy of the vaccination record if obtained from local pharmacy or Health Dept. Verbalized acceptance and understanding.   Covid-19 vaccine status: Declined, Education has been provided regarding the importance of this vaccine but patient still declined. Advised may receive this vaccine at local pharmacy or Health Dept.or  vaccine clinic. Aware to provide a copy of the vaccination record if obtained from local pharmacy or Health Dept. Verbalized acceptance and understanding.  Qualifies for Shingles Vaccine? Yes  Zostavax completed No   Shingrix Completed?: No.    Education has been provided regarding the importance of this vaccine. Patient has been advised to call insurance company to determine out of pocket expense if they have not yet received this vaccine. Advised may also receive vaccine at local pharmacy or Health Dept. Verbalized acceptance and understanding.  Screening Tests Health Maintenance  Topic Date Due   COVID-19 Vaccine (1) Never done   Zoster Vaccines- Shingrix (1 of 2) Never done   PNA vac Low Risk Adult (1 of 2 - PCV13) Never done   INFLUENZA VACCINE  12/27/2020   TETANUS/TDAP  06/17/2028   HPV VACCINES  Aged Out    Health Maintenance  Health Maintenance Due  Topic Date Due   COVID-19 Vaccine (1) Never done   Zoster Vaccines- Shingrix (1 of 2) Never done   PNA vac Low Risk Adult (1 of 2 - PCV13) Never done    Colorectal cancer screening: No longer required.   Lung Cancer Screening: (Low Dose CT Chest recommended if Age 37-80 years, 30 pack-year currently smoking OR have quit w/in 15years.) does not qualify.   Lung Cancer Screening Referral: no  Additional Screening:  Hepatitis C Screening: does not qualify; Completed no  Vision Screening: Recommended annual ophthalmology exams for early detection of glaucoma and other disorders of the eye. Is the patient up to date with their annual eye exam?  Yes  Who is the provider or what is the name of the office in which the patient attends annual eye exams? [facility] If pt is not established with a provider, would they like to be referred to a provider to establish care? No .   Dental Screening: Recommended annual dental exams for proper oral hygiene  Community Resource Referral / Chronic Care Management: CRR  required this visit?  No   CCM required this visit?  No      Plan:     I have personally reviewed and noted the following in the patient's chart:   Medical and social history Use of alcohol, tobacco or illicit drugs  Current medications and supplements including opioid prescriptions. Patient is not currently taking opioid prescriptions. Functional ability and status Nutritional status Physical activity Advanced directives List of other physicians Hospitalizations, surgeries, and ER visits in previous 12 months Vitals Screenings to include cognitive, depression, and falls Referrals and appointments  In addition, I have reviewed and discussed with patient certain preventive protocols, quality metrics, and best practice recommendations. A written personalized care plan for preventive services as well as general preventive health recommendations were provided to patient.     Sheral Flow, LPN   01/29/4267   Nurse Notes: n/a  Medical screening examination/treatment/procedure(s) were performed by non-physician practitioner and as supervising physician I was immediately available for consultation/collaboration.  I agree with above. Lew Dawes, MD

## 2020-12-02 ENCOUNTER — Encounter: Payer: Self-pay | Admitting: Internal Medicine

## 2020-12-02 ENCOUNTER — Ambulatory Visit (INDEPENDENT_AMBULATORY_CARE_PROVIDER_SITE_OTHER): Payer: Medicare Other | Admitting: Internal Medicine

## 2020-12-02 ENCOUNTER — Other Ambulatory Visit: Payer: Self-pay

## 2020-12-02 VITALS — BP 162/84 | HR 63 | Temp 97.7°F | Ht 69.0 in | Wt 210.8 lb

## 2020-12-02 DIAGNOSIS — I739 Peripheral vascular disease, unspecified: Secondary | ICD-10-CM | POA: Diagnosis not present

## 2020-12-02 DIAGNOSIS — Z125 Encounter for screening for malignant neoplasm of prostate: Secondary | ICD-10-CM

## 2020-12-02 DIAGNOSIS — G6289 Other specified polyneuropathies: Secondary | ICD-10-CM

## 2020-12-02 DIAGNOSIS — G8929 Other chronic pain: Secondary | ICD-10-CM

## 2020-12-02 DIAGNOSIS — Z0000 Encounter for general adult medical examination without abnormal findings: Secondary | ICD-10-CM | POA: Diagnosis not present

## 2020-12-02 DIAGNOSIS — M545 Low back pain, unspecified: Secondary | ICD-10-CM

## 2020-12-02 DIAGNOSIS — R202 Paresthesia of skin: Secondary | ICD-10-CM

## 2020-12-02 DIAGNOSIS — I10 Essential (primary) hypertension: Secondary | ICD-10-CM

## 2020-12-02 LAB — CBC WITH DIFFERENTIAL/PLATELET
Basophils Absolute: 0.1 10*3/uL (ref 0.0–0.1)
Basophils Relative: 1.5 % (ref 0.0–3.0)
Eosinophils Absolute: 0.4 10*3/uL (ref 0.0–0.7)
Eosinophils Relative: 6.5 % — ABNORMAL HIGH (ref 0.0–5.0)
HCT: 43 % (ref 39.0–52.0)
Hemoglobin: 15.1 g/dL (ref 13.0–17.0)
Lymphocytes Relative: 26.7 % (ref 12.0–46.0)
Lymphs Abs: 1.8 10*3/uL (ref 0.7–4.0)
MCHC: 35 g/dL (ref 30.0–36.0)
MCV: 92.5 fl (ref 78.0–100.0)
Monocytes Absolute: 0.6 10*3/uL (ref 0.1–1.0)
Monocytes Relative: 8.3 % (ref 3.0–12.0)
Neutro Abs: 3.9 10*3/uL (ref 1.4–7.7)
Neutrophils Relative %: 57 % (ref 43.0–77.0)
Platelets: 245 10*3/uL (ref 150.0–400.0)
RBC: 4.65 Mil/uL (ref 4.22–5.81)
RDW: 13.4 % (ref 11.5–15.5)
WBC: 6.8 10*3/uL (ref 4.0–10.5)

## 2020-12-02 LAB — COMPREHENSIVE METABOLIC PANEL
ALT: 22 U/L (ref 0–53)
AST: 32 U/L (ref 0–37)
Albumin: 3.9 g/dL (ref 3.5–5.2)
Alkaline Phosphatase: 96 U/L (ref 39–117)
BUN: 16 mg/dL (ref 6–23)
CO2: 25 mEq/L (ref 19–32)
Calcium: 8.8 mg/dL (ref 8.4–10.5)
Chloride: 106 mEq/L (ref 96–112)
Creatinine, Ser: 0.93 mg/dL (ref 0.40–1.50)
GFR: 77.17 mL/min (ref >60.00)
Glucose, Bld: 93 mg/dL (ref 70–99)
Potassium: 3.8 mEq/L (ref 3.5–5.1)
Sodium: 140 mEq/L (ref 135–145)
Total Bilirubin: 0.8 mg/dL (ref 0.2–1.2)
Total Protein: 6.5 g/dL (ref 6.0–8.3)

## 2020-12-02 LAB — URINALYSIS
Bilirubin Urine: NEGATIVE
Hgb urine dipstick: NEGATIVE
Ketones, ur: NEGATIVE
Leukocytes,Ua: NEGATIVE
Nitrite: NEGATIVE
Specific Gravity, Urine: 1.015 (ref 1.000–1.030)
Total Protein, Urine: NEGATIVE
Urine Glucose: NEGATIVE
Urobilinogen, UA: 0.2 (ref 0.0–1.0)
pH: 7.5 (ref 5.0–8.0)

## 2020-12-02 LAB — LIPID PANEL
Cholesterol: 152 mg/dL (ref 0–200)
HDL: 45.8 mg/dL (ref >39.00)
LDL Cholesterol: 90 mg/dL (ref 0–99)
NonHDL: 106.62
Total CHOL/HDL Ratio: 3
Triglycerides: 82 mg/dL (ref 0.0–149.0)
VLDL: 16.4 mg/dL (ref 0.0–40.0)

## 2020-12-02 LAB — TSH: TSH: 2.64 u[IU]/mL (ref 0.35–5.50)

## 2020-12-02 LAB — PSA: PSA: 0.59 ng/mL (ref 0.10–4.00)

## 2020-12-02 MED ORDER — SILDENAFIL CITRATE 100 MG PO TABS: 100.0000 mg | ORAL_TABLET | Freq: Every day | ORAL | 5 refills | Status: DC | PRN

## 2020-12-02 MED ORDER — GABAPENTIN 100 MG PO CAPS: 100.0000 mg | ORAL_CAPSULE | Freq: Three times a day (TID) | ORAL | 3 refills | Status: DC | PRN

## 2020-12-02 NOTE — Assessment & Plan Note (Signed)
LBP and B LE numbness - worse (3 years): L4-5 radiculopathy Likely related to spinal stenosis NCV L5 radiculopathy (2021). F/u w/Dr Tomi Likens suggested Try Gabapentin 100-200 mg tid prn MRI LS Spine adviced

## 2020-12-02 NOTE — Assessment & Plan Note (Signed)
Amlodipine and lisinopril HCT

## 2020-12-02 NOTE — Progress Notes (Signed)
Subjective:  Patient ID: Mario Huerta, male    DOB: 1941-01-10  Age: 80 y.o. MRN: [id number]  CC: Back Pain (Pt states his back pain is a little better... just bee stiff when he get up) and Numbness (C/o of numbness in his feet)   HPI Mario Huerta presents for B LE numbness - worse w/standing and walking. Better w/sitting, laying down.Marland KitchenMarland KitchenNCV L5 radiculopathy (2021).  He is complaining of claudication.  Follow-up on hypertension.     Outpatient Medications Prior to Visit  Medication Sig Dispense Refill   AMBULATORY NON FORMULARY MEDICATION Lion's Main Mushroom capsules 2 capsules by mouth daily     amLODipine (NORVASC) 5 MG tablet TAKE 1 TABLET BY MOUTH DAILY 90 tablet 1   aspirin EC 81 MG tablet Take 81 mg by mouth daily.     Cholecalciferol (VITAMIN D3) 50 MCG (2000 UT) capsule Take 1 capsule (2,000 Units total) by mouth daily. 100 capsule 3   finasteride (PROSCAR) 5 MG tablet TAKE 1 TABLET DAILY. MUST SEE PROVIDER BEFORE ANY MORE REFILLS. 90 tablet 3   gabapentin (NEURONTIN) 300 MG capsule Take 1 capsule (300 mg total) by mouth 3 (three) times daily. 90 capsule 3   Glucosamine-Chondroitin-MSM (TRIPLE FLEX PO) Take 1 tablet by mouth daily.     lisinopril-hydrochlorothiazide (ZESTORETIC) 20-12.5 MG tablet TAKE 1 TABLET BY MOUTH DAILY 90 tablet 3   Multiple Vitamins-Minerals (ONE-A-DAY MENS 50+) TABS Take 1 tablet by mouth every Friday.     Omega-3 Fatty Acids (FISH OIL) 1000 MG CAPS Take 1,000 mg by mouth daily.     pantoprazole (PROTONIX) 40 MG tablet TAKE 1 TABLET(40 MG) BY MOUTH DAILY 30 tablet 11   sildenafil (VIAGRA) 100 MG tablet Take 1 tablet (100 mg total) by mouth daily as needed for erectile dysfunction. 12 tablet 5   Specialty Vitamins Products (MAGNESIUM, AMINO ACID CHELATE,) 133 MG tablet Take 1 tablet by mouth 2 (two) times daily.     UNABLE TO FIND Med Name: Neuropaquell     VITAMIN E PO Take 1 tablet by mouth daily.     No facility-administered medications prior to  visit.    ROS: Review of Systems  Constitutional:  Negative for appetite change, fatigue and unexpected weight change.  HENT:  Negative for congestion, nosebleeds, sneezing, sore throat and trouble swallowing.   Eyes:  Negative for itching and visual disturbance.  Respiratory:  Negative for cough.   Cardiovascular:  Negative for chest pain, palpitations and leg swelling.  Gastrointestinal:  Negative for abdominal distention, blood in stool, diarrhea and nausea.  Genitourinary:  Negative for frequency and hematuria.  Musculoskeletal:  Positive for back pain and gait problem. Negative for joint swelling and neck pain.  Skin:  Negative for rash.  Neurological:  Positive for weakness and numbness. Negative for dizziness, tremors and speech difficulty.  Psychiatric/Behavioral:  Negative for agitation, dysphoric mood and sleep disturbance. The patient is not nervous/anxious.    Objective:  BP (!) 162/84 (BP Location: Left Arm)   Pulse 63   Temp 97.7 F (36.5 C) (Oral)   Ht 5\' 9"  (1.753 m)   Wt 210 lb 12.8 oz (95.6 kg)   SpO2 96%   BMI 31.13 kg/m   BP Readings from Last 3 Encounters:  12/02/20 (!) 162/84  11/30/20 140/80  09/16/20 130/80    Wt Readings from Last 3 Encounters:  12/02/20 210 lb 12.8 oz (95.6 kg)  11/30/20 209 lb 9.6 oz (95.1 kg)  09/16/20 211 lb  6 oz (95.9 kg)    Physical Exam Constitutional:      General: He is not in acute distress.    Appearance: He is well-developed.     Comments: NAD  Eyes:     Conjunctiva/sclera: Conjunctivae normal.     Pupils: Pupils are equal, round, and reactive to light.  Neck:     Thyroid: No thyromegaly.     Vascular: No JVD.  Cardiovascular:     Rate and Rhythm: Normal rate and regular rhythm.     Heart sounds: Normal heart sounds. No murmur heard.   No friction rub. No gallop.  Pulmonary:     Effort: Pulmonary effort is normal. No respiratory distress.     Breath sounds: Normal breath sounds. No wheezing or rales.   Chest:     Chest wall: No tenderness.  Abdominal:     General: Bowel sounds are normal. There is no distension.     Palpations: Abdomen is soft. There is no mass.     Tenderness: There is no abdominal tenderness. There is no guarding or rebound.  Musculoskeletal:        General: No tenderness. Normal range of motion.     Cervical back: Normal range of motion.     Right lower leg: No edema.     Left lower leg: No edema.  Lymphadenopathy:     Cervical: No cervical adenopathy.  Skin:    General: Skin is warm and dry.     Findings: No rash.  Neurological:     Mental Status: He is alert and oriented to person, place, and time.     Cranial Nerves: No cranial nerve deficit.     Motor: No abnormal muscle tone.     Coordination: Coordination normal.     Gait: Gait abnormal.     Deep Tendon Reflexes: Reflexes are normal and symmetric.  Psychiatric:        Behavior: Behavior normal.        Thought Content: Thought content normal.        Judgment: Judgment normal.  Antalgic gait Leaning forward while walking LS spine with good range of motion.  Straight leg elevation is negative bilaterally Sensory exam is decreased in both feet No pathologic reflexes  Lab Results  Component Value Date   WBC 5.6 08/06/2019   HGB 15.2 08/06/2019   HCT 43.5 08/06/2019   PLT 237.0 08/06/2019   GLUCOSE 93 08/06/2019   CHOL 150 08/06/2019   TRIG 94.0 08/06/2019   HDL 45.20 08/06/2019   LDLCALC 86 08/06/2019   ALT 22 08/06/2019   AST 34 08/06/2019   NA 138 08/06/2019   K 4.2 08/06/2019   CL 105 08/06/2019   CREATININE 0.93 08/06/2019   BUN 16 08/06/2019   CO2 27 08/06/2019   TSH 2.22 08/06/2019   PSA 1.26 08/06/2019    VAS [place] LE ART SEG MULTI (Segm&LE Reynauds)  Result Date: 08/19/2019 LOWER EXTREMITY DOPPLER STUDY High Risk Factors: Hypertension, no history of smoking. Other Factors: Peripheral neuropathy.                 Patient states that he has numbness and tingling in both lower                 extremities. He denies any claudication and states he walks up to                100 yards with no issues.  Performing Technologist: Wilkie Aye RVT  Examination Guidelines: A complete evaluation includes at minimum, Doppler waveform signals and systolic blood pressure reading at the level of bilateral brachial, anterior tibial, and posterior tibial arteries, when vessel segments are accessible. Bilateral testing is considered an integral part of a complete examination. Photoelectric Plethysmograph (PPG) waveforms and toe systolic pressure readings are included as required and additional duplex testing as needed. Limited examinations for reoccurring indications may be performed as noted.  ABI Findings: +---------+------------------+-----+--------+--------+ Right    Rt Pressure (mmHg)IndexWaveformComment  +---------+------------------+-----+--------+--------+ Brachial 189                                     +---------+------------------+-----+--------+--------+ ATA      209               1.11                  +---------+------------------+-----+--------+--------+ PTA      205               1.08                  +---------+------------------+-----+--------+--------+ PERO     202               1.07                  +---------+------------------+-----+--------+--------+ Great Toe211               1.12                  +---------+------------------+-----+--------+--------+ +---------+------------------+-----+--------+-------+ Left     Lt Pressure (mmHg)IndexWaveformComment +---------+------------------+-----+--------+-------+ Brachial 177                                    +---------+------------------+-----+--------+-------+ ATA      218               1.15                 +---------+------------------+-----+--------+-------+ PTA      210               1.11                 +---------+------------------+-----+--------+-------+ PERO     211                1.12                 +---------+------------------+-----+--------+-------+ Great Toe193               1.02                 +---------+------------------+-----+--------+-------+ +-------+-----------+-----------+------------+------------+ ABI/TBIToday's ABIToday's TBIPrevious ABIPrevious TBI +-------+-----------+-----------+------------+------------+ Right  1.11       1.12                                +-------+-----------+-----------+------------+------------+ Left   1.15       1.02                                +-------+-----------+-----------+------------+------------+  Summary: Right: Resting right ankle-brachial index is within normal range. No evidence of significant right lower extremity arterial disease. The right toe-brachial index is normal. Left: Resting left ankle-brachial  index is within normal range. No evidence of significant left lower extremity arterial disease. The left toe-brachial index is normal.  *See table(s) above for measurements and observations.  Electronically signed by Larae Grooms MD on 08/19/2019 at 5:17:53 PM.    Final     Assessment & Plan:    Follow-up: No follow-ups on file.  Walker Kehr, MD

## 2020-12-02 NOTE — Patient Instructions (Addendum)
Soak feet in Epsom salt solution Try magnesium tablets   Cardiac CT calcium scoring test $99 Tel # is [phone]   Computed tomography, more commonly known as a CT or CAT scan, is a diagnostic medical imaging test. Like traditional x-rays, it produces multiple images or pictures of the inside of the body. The cross-sectional images generated during a CT scan can be reformatted in multiple planes. They can even generate three-dimensional images. These images can be viewed on a computer monitor, printed on film or by a 3D printer, or transferred to a CD or DVD. CT images of internal organs, bones, soft tissue and blood vessels provide greater detail than traditional x-rays, particularly of soft tissues and blood vessels. A cardiac CT scan for coronary calcium is a non-invasive way of obtaining information about the presence, location and extent of calcified plaque in the coronary arteries--the vessels that supply oxygen-containing blood to the heart muscle. Calcified plaque results when there is a build-up of fat and other substances under the inner layer of the artery. This material can calcify which signals the presence of atherosclerosis, a disease of the vessel wall, also called coronary artery disease (CAD). People with this disease have an increased risk for heart attacks. In addition, over time, progression of plaque build up (CAD) can narrow the arteries or even close off blood flow to the heart. The result may be chest pain, sometimes called "angina," or a heart attack. Because calcium is a marker of CAD, the amount of calcium detected on a cardiac CT scan is a helpful prognostic tool. The findings on cardiac CT are expressed as a calcium score. Another name for this test is coronary artery calcium scoring.  What are some common uses of the procedure? The goal of cardiac CT scan for calcium scoring is to determine if CAD is present and to what extent, even if there are no symptoms. It is a  screening study that may be recommended by a physician for patients with risk factors for CAD but no clinical symptoms. The major risk factors for CAD are: high blood cholesterol levels  family history of heart attacks  diabetes  high blood pressure  cigarette smoking  overweight or obese  physical inactivity   A negative cardiac CT scan for calcium scoring shows no calcification within the coronary arteries. This suggests that CAD is absent or so minimal it cannot be seen by this technique. The chance of having a heart attack over the next two to five years is very low under these circumstances. A positive test means that CAD is present, regardless of whether or not the patient is experiencing any symptoms. The amount of calcification--expressed as the calcium score--may help to predict the likelihood of a myocardial infarction (heart attack) in the coming years and helps your medical doctor or cardiologist decide whether the patient may need to take preventive medicine or undertake other measures such as diet and exercise to lower the risk for heart attack. The extent of CAD is graded according to your calcium score:  Calcium Score  Presence of CAD (coronary artery disease)  0 No evidence of CAD   1-10 Minimal evidence of CAD  11-100 Mild evidence of CAD  101-400 Moderate evidence of CAD  Over 400 Extensive evidence of CAD   Coronary artery calcium (CAC) score is a strong predictor of incident coronary heart disease (CHD) and provides predictive information beyond traditional risk factors. CAC scoring is reasonable to use in the decision to withhold,  postpone, or initiate statin therapy in intermediate-risk or selected borderline-risk asymptomatic adults (age 24-75 years and LDL-C >=70 to <190 mg/dL) who do not have diabetes or established atherosclerotic cardiovascular disease (ASCVD).* In intermediate-risk (10-year ASCVD risk >=7.5% to <20%) adults or selected borderline-risk  (10-year ASCVD risk >=5% to <7.5%) adults in whom a CAC score is measured for the purpose of making a treatment decision the following recommendations have been made:   If CAC=0, it is reasonable to withhold statin therapy and reassess in 5 to 10 years, as long as higher risk conditions are absent (diabetes mellitus, family history of premature CHD in first degree relatives (males <55 years; females <65 years), cigarette smoking, or LDL >=190 mg/dL).   If CAC is 1 to 99, it is reasonable to initiate statin therapy for patients >=75 years of age.   If CAC is >=100 or >=75th percentile, it is reasonable to initiate statin therapy at any age.   Cardiology referral should be considered for patients with CAC scores >=400 or >=75th percentile.   *2018 AHA/ACC/AACVPR/AAPA/ABC/ACPM/ADA/AGS/APhA/ASPC/NLA/PCNA Guideline on the Management of Blood Cholesterol: A Report of the American College of Cardiology/American Heart Association Task Force on Clinical Practice Guidelines. J Am Coll Cardiol. 2019;73(24):3168-3209.

## 2020-12-02 NOTE — Assessment & Plan Note (Addendum)
B LE numbness - worse (3 years): L4-5 radiculopathy Likely related to spinal stenosis NCV L5 radiculopathy (2021). F/u w/Dr Tomi Likens suggested Try Gabapentin 100-200 mg tid prn MRI LS Spine adviced

## 2020-12-02 NOTE — Assessment & Plan Note (Addendum)
LBP and B LE numbness - worse (3 years): L4-5 radiculopathy Likely related to spinal stenosis NCV L5 radiculopathy (2021). F/u w/Dr Tomi Likens suggested Try Gabapentin 100-200 mg tid prn MRI LS Spine advised Soak feet in Epsom salt solution Try magnesium tablets

## 2020-12-02 NOTE — Assessment & Plan Note (Signed)

## 2021-01-24 ENCOUNTER — Emergency Department (HOSPITAL_COMMUNITY): Payer: Medicare Other

## 2021-01-24 ENCOUNTER — Emergency Department (HOSPITAL_COMMUNITY)
Admission: EM | Admit: 2021-01-24 | Discharge: 2021-01-24 | Disposition: A | Discharge status: Home / Self Care | Payer: Medicare Other | Attending: Emergency Medicine | Admitting: Emergency Medicine

## 2021-01-24 ENCOUNTER — Telehealth: Payer: Self-pay | Admitting: Internal Medicine

## 2021-01-24 ENCOUNTER — Other Ambulatory Visit: Payer: Self-pay

## 2021-01-24 ENCOUNTER — Encounter (HOSPITAL_COMMUNITY): Payer: Self-pay

## 2021-01-24 DIAGNOSIS — Z79899 Other long term (current) drug therapy: Secondary | ICD-10-CM | POA: Insufficient documentation

## 2021-01-24 DIAGNOSIS — R600 Localized edema: Secondary | ICD-10-CM | POA: Insufficient documentation

## 2021-01-24 DIAGNOSIS — R0602 Shortness of breath: Secondary | ICD-10-CM | POA: Diagnosis not present

## 2021-01-24 DIAGNOSIS — Z20822 Contact with and (suspected) exposure to covid-19: Secondary | ICD-10-CM | POA: Diagnosis not present

## 2021-01-24 DIAGNOSIS — R079 Chest pain, unspecified: Secondary | ICD-10-CM | POA: Diagnosis not present

## 2021-01-24 DIAGNOSIS — Z85828 Personal history of other malignant neoplasm of skin: Secondary | ICD-10-CM | POA: Insufficient documentation

## 2021-01-24 DIAGNOSIS — Z7982 Long term (current) use of aspirin: Secondary | ICD-10-CM | POA: Diagnosis not present

## 2021-01-24 DIAGNOSIS — Z96641 Presence of right artificial hip joint: Secondary | ICD-10-CM | POA: Diagnosis not present

## 2021-01-24 DIAGNOSIS — I10 Essential (primary) hypertension: Secondary | ICD-10-CM | POA: Insufficient documentation

## 2021-01-24 DIAGNOSIS — R0789 Other chest pain: Secondary | ICD-10-CM | POA: Diagnosis not present

## 2021-01-24 DIAGNOSIS — R7989 Other specified abnormal findings of blood chemistry: Secondary | ICD-10-CM | POA: Diagnosis not present

## 2021-01-24 LAB — CBC WITH DIFFERENTIAL/PLATELET
Abs Immature Granulocytes: 0.02 10*3/uL (ref 0.00–0.07)
Basophils Absolute: 0.1 10*3/uL (ref 0.0–0.1)
Basophils Relative: 1 %
Eosinophils Absolute: 0.4 10*3/uL (ref 0.0–0.5)
Eosinophils Relative: 6 %
HCT: 44.8 % (ref 39.0–52.0)
Hemoglobin: 15.7 g/dL (ref 13.0–17.0)
Immature Granulocytes: 0 %
Lymphocytes Relative: 24 %
Lymphs Abs: 1.6 10*3/uL (ref 0.7–4.0)
MCH: 32.2 pg (ref 26.0–34.0)
MCHC: 35 g/dL (ref 30.0–36.0)
MCV: 91.8 fL (ref 80.0–100.0)
Monocytes Absolute: 0.6 10*3/uL (ref 0.1–1.0)
Monocytes Relative: 9 %
Neutro Abs: 3.8 10*3/uL (ref 1.7–7.7)
Neutrophils Relative %: 60 %
Platelets: 262 10*3/uL (ref 150–400)
RBC: 4.88 MIL/uL (ref 4.22–5.81)
RDW: 13 % (ref 11.5–15.5)
WBC: 6.4 10*3/uL (ref 4.0–10.5)
nRBC: 0 % (ref 0.0–0.2)

## 2021-01-24 LAB — RESP PANEL BY RT-PCR (FLU A&B, COVID) ARPGX2
Influenza A by PCR: NEGATIVE
Influenza B by PCR: NEGATIVE
SARS Coronavirus 2 by RT PCR: NEGATIVE

## 2021-01-24 LAB — BASIC METABOLIC PANEL
Anion gap: 6 (ref 5–15)
BUN: 14 mg/dL (ref 8–23)
CO2: 25 mmol/L (ref 22–32)
Calcium: 8.9 mg/dL (ref 8.9–10.3)
Chloride: 108 mmol/L (ref 98–111)
Creatinine, Ser: 0.97 mg/dL (ref 0.61–1.24)
GFR, Estimated: >60 mL/min (ref >60)
Glucose, Bld: 104 mg/dL — ABNORMAL HIGH (ref 70–99)
Potassium: 4.1 mmol/L (ref 3.5–5.1)
Sodium: 139 mmol/L (ref 135–145)

## 2021-01-24 LAB — D-DIMER, QUANTITATIVE: D-Dimer, Quant: 0.7 ug/mL-FEU — ABNORMAL HIGH (ref 0.00–0.50)

## 2021-01-24 LAB — BRAIN NATRIURETIC PEPTIDE: B Natriuretic Peptide: 65.7 pg/mL (ref 0.0–100.0)

## 2021-01-24 LAB — TROPONIN I (HIGH SENSITIVITY)
Troponin I (High Sensitivity): 5 ng/L (ref <18)
Troponin I (High Sensitivity): 5 ng/L (ref <18)

## 2021-01-24 MED ORDER — IOHEXOL 350 MG/ML SOLN
79.0000 mL | Freq: Once | INTRAVENOUS | Status: AC | PRN
  Administered 2021-01-24: 79 mL via INTRAVENOUS

## 2021-01-24 NOTE — ED Provider Notes (Signed)
[facility] EMERGENCY DEPARTMENT Provider Note   CSN: [id number] Arrival date & time: 01/24/21  1034     History No chief complaint on file.   Mario Huerta is a 80 y.o. male.  80 year old male with prior medical history as detailed below presents for evaluation.  Patient complains of worsening shortness of breath especially with exertion for the last 2 days.  Symptoms started on Saturday and have progressed.  He denies difficulty with his breathing at rest.  He denies prior history of lung or heart problems.  He reports significant daily activities.  He lives with his daughter who is present in the ED with him.  He denies any associated chest pain.  He denies fever or cough.  He reports that his symptoms are most acute with exertion.  He denies prior history of DVT or PE.  The history is provided by the patient and a relative.  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   Peripheral neuropathy 08/06/2019   Claudication ([address]) 08/06/2019   Neck pain 06/17/2018   Low back pain 06/17/2018   Paresthesia 06/17/2018   Esophageal stricture 06/17/2018   Rash 06/17/2018   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   Cerumen impaction 06/17/2018   Blood donor 06/17/2018   Essential hypertension 06/17/2018   Neoplasm of uncertain behavior of skin 06/17/2018   Actinic keratosis 06/17/2018    Past Surgical History:  Procedure Laterality Date   BIOPSY  01/31/2018   Procedure: BIOPSY;  Surgeon: Ronnette Juniper, MD;   Location: [facility] ENDOSCOPY;  Service: Gastroenterology;;   ESOPHAGOGASTRODUODENOSCOPY N/A 01/31/2018   Procedure: ESOPHAGOGASTRODUODENOSCOPY (EGD);  Surgeon: Ronnette Juniper, MD;  Location: [facility];  Service: Gastroenterology;  Laterality: N/A;   FOREIGN BODY REMOVAL  01/31/2018   Procedure: FOREIGN BODY REMOVAL;  Surgeon: Ronnette Juniper, MD;  Location: [facility] ENDOSCOPY;  Service: Gastroenterology;;   HERNIA REPAIR     MOUTH SURGERY  2019   TOTAL HIP ARTHROPLASTY Right        Family History  Problem Relation Age of Onset   Colon cancer Neg Hx    Esophageal cancer Neg Hx    Pancreatic cancer Neg Hx    Stomach cancer Neg Hx    Liver disease Neg Hx     Social History   Tobacco Use   Smoking status: Never   Smokeless tobacco: Never  Vaping Use   Vaping Use: Never used  Substance Use Topics   Alcohol use: Yes    Comment: occasional   Drug use: Never    Home Medications Prior to Admission medications   Medication Sig Start Date End Date Taking? Authorizing Provider  AMBULATORY NON FORMULARY MEDICATION Lion[employer] Mushroom capsules 2 capsules by mouth daily    [provider]  amLODipine (NORVASC) 5 MG tablet TAKE 1 TABLET BY MOUTH DAILY 09/30/20   Plotnikov, Evie Lacks, MD  aspirin EC 81 MG tablet Take 81 mg by mouth daily.    [provider]  Cholecalciferol (VITAMIN D3) 50 MCG (2000 UT) capsule Take 1 capsule (2,000 Units total) by mouth daily. 06/17/18   Plotnikov, Evie Lacks, MD  finasteride (PROSCAR) 5 MG tablet TAKE 1 TABLET DAILY. MUST SEE PROVIDER BEFORE ANY MORE REFILLS. 10/07/20   Plotnikov, Evie Lacks, MD  gabapentin (NEURONTIN) 100 MG capsule Take 1-2 capsules (100-200 mg total) by mouth 3 (three) times daily as needed (feet numbness, back pain). 12/02/20   Plotnikov, Evie Lacks, MD  Glucosamine-Chondroitin-MSM (TRIPLE FLEX PO) Take 1 tablet by mouth daily.    [provider]  lisinopril-hydrochlorothiazide (ZESTORETIC) 20-12.5 MG tablet TAKE 1 TABLET BY MOUTH  DAILY 05/24/20   Plotnikov, Evie Lacks, MD  Multiple Vitamins-Minerals (ONE-A-DAY MENS 50+) TABS Take 1 tablet by mouth every Friday.    [provider]  Omega-3 Fatty Acids (FISH OIL) 1000 MG CAPS Take 1,000 mg by mouth daily.    [provider]  pantoprazole (PROTONIX) 40 MG tablet TAKE 1 TABLET(40 MG) BY MOUTH DAILY 10/26/20   Ladene Artist, MD  sildenafil (VIAGRA) 100 MG tablet Take 1 tablet (100 mg total) by mouth daily as needed for erectile dysfunction. 12/02/20   Plotnikov, Evie Lacks, MD  Specialty Vitamins Products (MAGNESIUM, AMINO ACID CHELATE,) 133 MG tablet Take 1 tablet by mouth 2 (two) times daily.    [provider]  UNABLE TO FIND Med Name: Neuropaquell    [provider]  VITAMIN E PO Take 1 tablet by mouth daily.    [provider]    Allergies    Codeine, Hydrocodone, Oxycodone, and Tramadol  Review of Systems   Review of Systems  Respiratory:  Positive for shortness of breath.   All other systems reviewed and are negative.  Physical Exam Updated Vital Signs BP (!) 149/96 (BP Location: Left Arm)   Pulse (!) 58   Temp (!) 97.5 F (36.4 C) (Oral)   Resp 18   SpO2 98%   Physical Exam Vitals and nursing note reviewed.  Constitutional:      General: He is not in acute distress.    Appearance: Normal appearance. He is well-developed.  HENT:     Head: Normocephalic and atraumatic.  Eyes:     Conjunctiva/sclera: Conjunctivae normal.     Pupils: Pupils are equal, round, and reactive to light.  Cardiovascular:     Rate and Rhythm: Normal rate and regular rhythm.     Heart sounds: Normal heart sounds.  Pulmonary:     Effort: Pulmonary effort is normal. No respiratory distress.     Breath sounds: Normal breath sounds.  Abdominal:     General: There is no distension.     Palpations: Abdomen is soft.     Tenderness: There is no abdominal tenderness.  Musculoskeletal:        General: No deformity. Normal range of motion.      Cervical back: Normal range of motion and neck supple.     Right lower leg: Edema present.     Left lower leg: Edema present.     Comments: Trace edema of the bilateral ankles  Skin:    General: Skin is warm and dry.  Neurological:     General: No focal deficit present.     Mental Status: He is alert and oriented to person, place, and time.    ED Results / Procedures / Treatments   Labs (all labs ordered are listed, but only abnormal results are displayed) Labs Reviewed  BASIC METABOLIC PANEL - Abnormal; Notable  for the following components:      Result Value   Glucose, Bld 104 (*)    All other components within normal limits  D-DIMER, QUANTITATIVE - Abnormal; Notable for the following components:   D-Dimer, Quant 0.70 (*)    All other components within normal limits  RESP PANEL BY RT-PCR (FLU A&B, COVID) [id number]  CBC WITH DIFFERENTIAL/PLATELET  BRAIN NATRIURETIC PEPTIDE  TROPONIN I (HIGH SENSITIVITY)    EKG EKG Interpretation  Date/Time:  Monday January 24 2021 11:09:48 EDT Ventricular Rate:  63 PR Interval:  158 QRS Duration: 94 QT Interval:  402 QTC Calculation: 411 R Axis:   71 Text Interpretation: Sinus rhythm with Premature supraventricular complexes and with frequent Premature ventricular complexes Nonspecific ST abnormality Abnormal ECG Confirmed by Dene Gentry [phone]) on 01/24/2021 1:34:29 PM  Radiology DG Chest 2 View  Result Date: 01/24/2021 CLINICAL DATA:  Shortness of breath and chest tightness. EXAM: CHEST - 2 VIEW COMPARISON:  None. FINDINGS: The lungs are clear without focal pneumonia, edema, pneumothorax or pleural effusion. Mild asymmetric elevation left hemidiaphragm. The cardiopericardial silhouette is within normal limits for size. The visualized bony structures of the thorax show no acute abnormality. Stable tiny nodular density at the right base, likely a nipple shadow. IMPRESSION: No active cardiopulmonary disease. Electronically Signed   By:  Misty Stanley M.D.   On: 01/24/2021 12:21    Procedures Procedures   Medications Ordered in ED Medications - No data to display  ED Course  I have reviewed the triage vital signs and the nursing notes.  Pertinent labs & imaging results that were available during my care of the patient were reviewed by me and considered in my medical decision making (see chart for details).    MDM Rules/Calculators/A&P                           MDM  MSE complete  Mario Huerta was evaluated in Emergency Department on 01/24/2021 for the symptoms described in the history of present illness. He was evaluated in the context of the global COVID-19 pandemic, which necessitated consideration that the patient might be at risk for infection with the SARS-CoV-2 virus that causes COVID-19. Institutional protocols and algorithms that pertain to the evaluation of patients at risk for COVID-19 are in a state of rapid change based on information released by regulatory bodies including the CDC and federal and state organizations. These policies and algorithms were followed during the patient's care in the ED.  Patient with complaint of dyspnea with exertion.  Workup will include basic screening labs, covid screen, troponin, and CTA PE rule out.  Final disposition pending at time of sign-out to Dr. Reather Converse.   Final Clinical Impression(s) / ED Diagnoses Final diagnoses:  Acute chest pain    Rx / DC Orders ED Discharge Orders     None        Valarie Merino, MD 01/25/21 1003

## 2021-01-24 NOTE — ED Provider Notes (Signed)
Emergency Medicine Provider Triage Evaluation Note  TYREQUE SCHNURR , a 80 y.o. male  was evaluated in triage.  Pt complains of sob.  Review of Systems  Positive: Sob, heart palpitation Negative: Cp, fever, cough  Physical Exam  BP (!) 141/97   Pulse 70   Temp 98.2 F (36.8 C) (Oral)   SpO2 96%  Gen:   Awake, no distress   Resp:  Normal effort  MSK:   Moves extremities without difficulty  Other:    Medical Decision Making  Medically screening exam initiated at 11:14 AM.  Appropriate orders placed.  [name] was informed that the remainder of the evaluation will be completed by another provider, this initial triage assessment does not replace that evaluation, and the importance of remaining in the ED until their evaluation is complete.  Pt report checking his HR daily and report fast heart rate 2 days ago accompanying with sob.  No hx of PE/DVT, denies having CP.    Domenic Moras, PA-C 01/24/21 1115    Godfrey Pick, MD 01/26/21 2318

## 2021-01-24 NOTE — Discharge Instructions (Addendum)
Increase amlodipine to 10 mg if tolerated, stay hydrated and stop taking if you feel lightheadedness. Follow-up closely with the heart doctor and your primary doctor.

## 2021-01-24 NOTE — ED Notes (Signed)
RN reviewed discharge instructions w/ pt. Follow up and medication change reviewed, pt had no further questions.

## 2021-01-24 NOTE — ED Triage Notes (Signed)
Patient complains of finding her HR high this am, states that it was 100. Also experiencing some SOB with exertion. Patient alert and oriented, denies cp.

## 2021-01-24 NOTE — Telephone Encounter (Signed)
Patient calling in with  respiratory symptoms: Shortness of breath, chest pain, palpitations or other red words send to Triage   **Transferred call to [facility]**

## 2021-01-24 NOTE — ED Provider Notes (Signed)
Patient care signed out to follow-up second troponin.  Second troponin reviewed negative.  Patient having no chest pain or shortness of breath and reassessment.  Heart rate normal, incorrectly recorded in the 30s, patient has no symptoms of heart block at this time.  EKG reviewed.  Patient will need close follow-up with heart doctor and primary care doctor.  Patient asking to go home for outpatient follow-up.  Daughter in the room assisting and will make sure he follows up outpatient.   Elnora Morrison, MD 01/24/21 [phone]

## 2021-01-24 NOTE — ED Notes (Signed)
Pt ambulated independently to bathroom.

## 2021-01-24 NOTE — Telephone Encounter (Signed)
Noted../lmb 

## 2021-01-25 ENCOUNTER — Other Ambulatory Visit: Payer: Self-pay | Admitting: Internal Medicine

## 2021-01-25 DIAGNOSIS — E785 Hyperlipidemia, unspecified: Secondary | ICD-10-CM

## 2021-01-26 ENCOUNTER — Encounter: Payer: Self-pay | Admitting: Internal Medicine

## 2021-01-26 ENCOUNTER — Ambulatory Visit (INDEPENDENT_AMBULATORY_CARE_PROVIDER_SITE_OTHER): Payer: Medicare Other | Admitting: Internal Medicine

## 2021-01-26 ENCOUNTER — Other Ambulatory Visit: Payer: Self-pay

## 2021-01-26 DIAGNOSIS — M545 Low back pain, unspecified: Secondary | ICD-10-CM | POA: Diagnosis not present

## 2021-01-26 DIAGNOSIS — R202 Paresthesia of skin: Secondary | ICD-10-CM

## 2021-01-26 DIAGNOSIS — G8929 Other chronic pain: Secondary | ICD-10-CM | POA: Diagnosis not present

## 2021-01-26 DIAGNOSIS — R0600 Dyspnea, unspecified: Secondary | ICD-10-CM | POA: Insufficient documentation

## 2021-01-26 DIAGNOSIS — R0609 Other forms of dyspnea: Secondary | ICD-10-CM

## 2021-01-26 LAB — URINALYSIS
Bilirubin Urine: NEGATIVE
Hgb urine dipstick: NEGATIVE
Ketones, ur: NEGATIVE
Leukocytes,Ua: NEGATIVE
Nitrite: NEGATIVE
Specific Gravity, Urine: 1.015 (ref 1.000–1.030)
Total Protein, Urine: NEGATIVE
Urine Glucose: NEGATIVE
Urobilinogen, UA: 0.2 (ref 0.0–1.0)
pH: 6 (ref 5.0–8.0)

## 2021-01-26 LAB — VITAMIN B12: Vitamin B-12: >1550 pg/mL — ABNORMAL HIGH (ref 211–911)

## 2021-01-26 LAB — SEDIMENTATION RATE: Sed Rate: 13 mm/hr (ref 0–20)

## 2021-01-26 NOTE — Progress Notes (Signed)
Subjective:  Patient ID: Mario Huerta, male    DOB: May 17, 1941  Age: 80 y.o. MRN: [id number]  CC: Follow-up (ER F/U- Pt states he is feeling woozy this am)   HPI Mario Huerta presents for SOB w/o CP - s/p ER visit on Monday. Chest CT was (-) B C/o fatigue, SOB 2 weeks  Outpatient Medications Prior to Visit  Medication Sig Dispense Refill   amLODipine (NORVASC) 5 MG tablet TAKE 1 TABLET BY MOUTH DAILY (Patient taking differently: Take 5 mg by mouth in the morning.) 90 tablet 1   aspirin EC 81 MG tablet Take 81 mg by mouth daily.     Cholecalciferol (VITAMIN D3) 50 MCG (2000 UT) capsule Take 1 capsule (2,000 Units total) by mouth daily. 100 capsule 3   finasteride (PROSCAR) 5 MG tablet TAKE 1 TABLET DAILY. MUST SEE PROVIDER BEFORE ANY MORE REFILLS. (Patient taking differently: Take 5 mg by mouth in the morning.) 90 tablet 3   Glucosamine-Chondroitin-MSM (TRIPLE FLEX PO) Take 1 tablet by mouth daily.     lisinopril-hydrochlorothiazide (ZESTORETIC) 20-12.5 MG tablet TAKE 1 TABLET BY MOUTH DAILY (Patient taking differently: Take 1 tablet by mouth in the morning.) 90 tablet 3   Multiple Vitamins-Minerals (CENTRUM SILVER 50+MEN) TABS Take 1 tablet by mouth daily with breakfast.     Omega-3 Fatty Acids (FISH OIL) 1000 MG CAPS Take 1,000 mg by mouth daily.     pantoprazole (PROTONIX) 40 MG tablet TAKE 1 TABLET(40 MG) BY MOUTH DAILY (Patient taking differently: Take 40 mg by mouth daily before breakfast.) 30 tablet 11   AMBULATORY NON FORMULARY MEDICATION [employer] Mushroom capsules 2 capsules by mouth daily (Patient not taking: No sig reported)     gabapentin (NEURONTIN) 100 MG capsule Take 1-2 capsules (100-200 mg total) by mouth 3 (three) times daily as needed (feet numbness, back pain). (Patient not taking: No sig reported) 270 capsule 3   sildenafil (VIAGRA) 100 MG tablet Take 1 tablet (100 mg total) by mouth daily as needed for erectile dysfunction. (Patient not taking: No sig reported)  12 tablet 5   UNABLE TO FIND Med Name: Neuropaquell (Patient not taking: Reported on 01/26/2021)     VITAMIN E PO Take 1 capsule by mouth daily. (Patient not taking: Reported on 01/26/2021)     No facility-administered medications prior to visit.    ROS: Review of Systems  Constitutional:  Positive for fatigue. Negative for appetite change and unexpected weight change.  HENT:  Negative for congestion, nosebleeds, sneezing, sore throat and trouble swallowing.   Eyes:  Negative for itching and visual disturbance.  Respiratory:  Positive for shortness of breath. Negative for cough.   Cardiovascular:  Negative for chest pain, palpitations and leg swelling.  Gastrointestinal:  Negative for abdominal distention, blood in stool, diarrhea and nausea.  Genitourinary:  Negative for frequency and hematuria.  Musculoskeletal:  Negative for back pain, gait problem, joint swelling and neck pain.  Skin:  Negative for rash.  Neurological:  Positive for weakness and numbness. Negative for dizziness, tremors and speech difficulty.  Psychiatric/Behavioral:  Negative for agitation, dysphoric mood, sleep disturbance and suicidal ideas. The patient is not nervous/anxious.    Objective:  BP 134/82 (BP Location: Left Arm)   Pulse 78   Temp 98.2 F (36.8 C) (Oral)   Ht '5\' 9"'$  (1.753 m)   Wt 207 lb 6.4 oz (94.1 kg)   SpO2 96%   BMI 30.63 kg/m   BP Readings from Last 3 Encounters:  01/26/21 134/82  01/24/21 (!) 141/105  12/02/20 (!) 162/84    Wt Readings from Last 3 Encounters:  01/26/21 207 lb 6.4 oz (94.1 kg)  12/02/20 210 lb 12.8 oz (95.6 kg)  11/30/20 209 lb 9.6 oz (95.1 kg)    Physical Exam Constitutional:      General: He is not in acute distress.    Appearance: He is well-developed.     Comments: NAD  Eyes:     Conjunctiva/sclera: Conjunctivae normal.     Pupils: Pupils are equal, round, and reactive to light.  Neck:     Thyroid: No thyromegaly.     Vascular: No JVD.  Cardiovascular:      Rate and Rhythm: Normal rate and regular rhythm.     Heart sounds: Normal heart sounds. No murmur heard.   No friction rub. No gallop.  Pulmonary:     Effort: Pulmonary effort is normal. No respiratory distress.     Breath sounds: Normal breath sounds. No wheezing or rales.  Chest:     Chest wall: No tenderness.  Abdominal:     General: Bowel sounds are normal. There is no distension.     Palpations: Abdomen is soft. There is no mass.     Tenderness: There is no abdominal tenderness. There is no guarding or rebound.  Musculoskeletal:        General: Tenderness present. Normal range of motion.     Cervical back: Normal range of motion.  Lymphadenopathy:     Cervical: No cervical adenopathy.  Skin:    General: Skin is warm and dry.     Findings: No rash.  Neurological:     Mental Status: He is alert and oriented to person, place, and time.     Cranial Nerves: No cranial nerve deficit.     Motor: No abnormal muscle tone.     Coordination: Coordination normal.     Gait: Gait normal.     Deep Tendon Reflexes: Reflexes are normal and symmetric.  Psychiatric:        Behavior: Behavior normal.        Thought Content: Thought content normal.        Judgment: Judgment normal.   Antalgic gait Lab Results  Component Value Date   WBC 6.4 01/24/2021   HGB 15.7 01/24/2021   HCT 44.8 01/24/2021   PLT 262 01/24/2021   GLUCOSE 104 (H) 01/24/2021   CHOL 152 12/02/2020   TRIG 82.0 12/02/2020   HDL 45.80 12/02/2020   LDLCALC 90 12/02/2020   ALT 22 12/02/2020   AST 32 12/02/2020   NA 139 01/24/2021   K 4.1 01/24/2021   CL 108 01/24/2021   CREATININE 0.97 01/24/2021   BUN 14 01/24/2021   CO2 25 01/24/2021   TSH 2.64 12/02/2020   PSA 0.59 12/02/2020    DG Chest 2 View  Result Date: 01/24/2021 CLINICAL DATA:  Shortness of breath and chest tightness. EXAM: CHEST - 2 VIEW COMPARISON:  None. FINDINGS: The lungs are clear without focal pneumonia, edema, pneumothorax or pleural  effusion. Mild asymmetric elevation left hemidiaphragm. The cardiopericardial silhouette is within normal limits for size. The visualized bony structures of the thorax show no acute abnormality. Stable tiny nodular density at the right base, likely a nipple shadow. IMPRESSION: No active cardiopulmonary disease. Electronically Signed   By: Misty Stanley M.D.   On: 01/24/2021 12:21   CT Angio Chest PE W and/or Wo Contrast  Result Date: 01/24/2021 CLINICAL DATA:  Shortness of  breath, positive D-dimer level. EXAM: CT ANGIOGRAPHY CHEST WITH CONTRAST TECHNIQUE: Multidetector CT imaging of the chest was performed using the standard protocol during bolus administration of intravenous contrast. Multiplanar CT image reconstructions and MIPs were obtained to evaluate the vascular anatomy. CONTRAST:  54m OMNIPAQUE IOHEXOL 350 MG/ML SOLN COMPARISON:  None. FINDINGS: Cardiovascular: Satisfactory opacification of the pulmonary arteries to the segmental level. No evidence of pulmonary embolism. Normal heart size. No pericardial effusion. Atherosclerosis of thoracic aorta is noted without aneurysm formation. Mediastinum/Nodes: No enlarged mediastinal, hilar, or axillary lymph nodes. Thyroid gland, trachea, and esophagus demonstrate no significant findings. Lungs/Pleura: Lungs are clear. No pleural effusion or pneumothorax. Upper Abdomen: No acute abnormality. Musculoskeletal: No chest wall abnormality. No acute or significant osseous findings. Review of the MIP images confirms the above findings. IMPRESSION: No definite evidence of pulmonary embolus. No definite acute abnormality seen in the chest. Aortic Atherosclerosis (ICD10-I70.0). Electronically Signed   By: JMarijo ConceptionM.D.   On: 01/24/2021 15:18    Assessment & Plan:     AWalker Kehr MD

## 2021-01-26 NOTE — Assessment & Plan Note (Signed)
Likely related to spinal stenosis NCV B L5 radiculopathy (2021).

## 2021-01-26 NOTE — Assessment & Plan Note (Addendum)
Worse Likely related to spinal stenosis NCV B L5 radiculopathy (2021). NS consult was offered ESR, SPEP, B12

## 2021-01-26 NOTE — Addendum Note (Signed)
Addended by: Boris Lown B on: 01/26/2021 09:49 AM   Modules accepted: Orders

## 2021-01-26 NOTE — Assessment & Plan Note (Addendum)
New x 2 weeks SOB w/o CP - s/p ER visit on Monday. Chest CT was (-) B C/o fatigue, SOB 2 weeks Will get B12, COVID Ab Cardiac ECHO

## 2021-01-28 LAB — PROTEIN ELECTROPHORESIS, SERUM
Albumin ELP: 3.9 g/dL (ref 3.8–4.8)
Alpha 1: 0.3 g/dL (ref 0.2–0.3)
Alpha 2: 0.7 g/dL (ref 0.5–0.9)
Beta 2: 0.3 g/dL (ref 0.2–0.5)
Beta Globulin: 0.4 g/dL (ref 0.4–0.6)
Gamma Globulin: 1 g/dL (ref 0.8–1.7)
Total Protein: 6.6 g/dL (ref 6.1–8.1)

## 2021-01-28 LAB — SARS COV-2 SEROLOGY(COVID-19)AB(IGG,IGM),IMMUNOASSAY
SARS CoV-2 AB IgG: NEGATIVE
SARS CoV-2 IgM: NEGATIVE

## 2021-02-01 ENCOUNTER — Ambulatory Visit: Payer: Medicare Other | Admitting: Internal Medicine

## 2021-02-01 ENCOUNTER — Other Ambulatory Visit: Payer: Self-pay

## 2021-02-01 VITALS — BP 140/90 | HR 73 | Ht 70.0 in | Wt 207.2 lb

## 2021-02-01 DIAGNOSIS — I2584 Coronary atherosclerosis due to calcified coronary lesion: Secondary | ICD-10-CM | POA: Diagnosis not present

## 2021-02-01 DIAGNOSIS — R0609 Other forms of dyspnea: Secondary | ICD-10-CM

## 2021-02-01 DIAGNOSIS — I2510 Atherosclerotic heart disease of native coronary artery without angina pectoris: Secondary | ICD-10-CM

## 2021-02-01 DIAGNOSIS — R0600 Dyspnea, unspecified: Secondary | ICD-10-CM

## 2021-02-01 DIAGNOSIS — I10 Essential (primary) hypertension: Secondary | ICD-10-CM | POA: Diagnosis not present

## 2021-02-01 MED ORDER — METOPROLOL TARTRATE 100 MG PO TABS: 100.0000 mg | ORAL_TABLET | Freq: Once | ORAL | 0 refills | Status: DC

## 2021-02-01 NOTE — Patient Instructions (Signed)
Medication Instructions:  PLEASE TAKE METOPROLOL TARTRATE '100mg'$  (2) HOURS PRIOR TO CCTA SCAN  *If you need a refill on your cardiac medications before your next appointment, please call your pharmacy*  Lab Work: [facility] [place]  If you have labs (blood work) drawn today and your tests are completely normal, you will receive your results only by: [place] (if you have MyChart) OR A paper copy in the mail If you have any lab test that is abnormal or we need to change your treatment, we will call you to review the results.  Testing/Procedures: Your physician has requested that you have cardiac CT. Cardiac computed tomography (CT) is a painless test that uses an x-ray machine to take clear, detailed pictures of your heart. For further information please visit [email or web address]. Please follow instruction sheet as given.  Your physician has requested that you have an echocardiogram. Echocardiography is a painless test that uses sound waves to create images of your heart. It provides your doctor with information about the size and shape of your heart and how well your heart's chambers and valves are working. You may receive an ultrasound enhancing agent through an IV if needed to better visualize your heart during the echo.This procedure takes approximately one hour. There are no restrictions for this procedure. This will take place at the [address], [address].   Follow-Up: At [facility], you and your health needs are our priority.  As part of our continuing mission to provide you with exceptional heart care, we have created designated Provider Care Teams.  These Care Teams include your primary Cardiologist (physician) and Advanced Practice Providers (APPs -  Physician Assistants and Nurse Practitioners) who all work together to provide you with the care you need, when you need it.  Your next appointment:   AFTER TESTING  The format for your next appointment:   In  Person  Provider:   Cherlynn Kaiser, MD  Other Instructions   Your cardiac CT will be scheduled at one of the below locations:   [facility] [address] [place], [place] [address] [phone]  If scheduled at [facility], please arrive at the [facility] main entrance (entrance A) of [facility] 30 minutes prior to test start time. Proceed to the [facility] Radiology Department (first floor) to check-in and test prep.  If scheduled at [facility], please arrive 15 mins early for check-in and test prep.  Please follow these instructions carefully (unless otherwise directed):  Hold all erectile dysfunction medications at least 3 days (72 hrs) prior to test.  On the Night Before the Test: Be sure to Drink plenty of water. Do not consume any caffeinated/decaffeinated beverages or chocolate 12 hours prior to your test. Do not take any antihistamines 12 hours prior to your test.  On the Day of the Test: Drink plenty of water until 1 hour prior to the test. Do not eat any food 4 hours prior to the test. You may take your regular medications prior to the test.  Take metoprolol (Lopressor) '100mg'$  two hours prior to test. HOLD Furosemide/Hydrochlorothiazide morning of the test.  After the Test: Drink plenty of water. After receiving IV contrast, you may experience a mild flushed feeling. This is normal. On occasion, you may experience a mild rash up to 24 hours after the test. This is not dangerous. If this occurs, you can take Benadryl 25 mg and increase your fluid intake.  If you experience trouble breathing, this can be serious. If it is severe call 911 IMMEDIATELY. If it is mild, please call our office. If you take any of these medications: Glipizide/Metformin, Avandament, Glucavance, please do not take 48 hours after completing test unless otherwise instructed.  Please allow 2-4 weeks for scheduling of routine cardiac  CTs. Some insurance companies require a pre-authorization which may delay scheduling of this test.   For non-scheduling related questions, please contact the cardiac imaging nurse navigator should you have any questions/concerns: Marchia Bond, Cardiac Imaging Nurse Navigator Gordy Clement, Cardiac Imaging Nurse Navigator [facility] Direct Office Dial: [phone]   For scheduling needs, including cancellations and rescheduling, please call [place], [phone].

## 2021-02-01 NOTE — Progress Notes (Signed)
Cardiology Office Note:    Date:  02/01/2021   ID:  Mario Huerta, DOB [birth year], MRN [id number]  PCP:  Cassandria Anger, MD  Cardiologist:  None  Electrophysiologist:  None   Referring MD: Cassandria Anger, MD   Chief Complaint/Reason for Referral: DOE  History of Present Illness:    Mario Huerta is a 80 y.o. male with a history of HTN.  SOB - 2 weeks ago. Started abruptly. DOE - 3-5 minutes, recovers quickly. Does not get CP with it. Takes ASA 81 mg daily.   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   Never smoker. NO ETOH.   Cor cals on CTA chest PE study performed recently - I have independently reviewed these images in the room with the patient.   Numb in feet 5 years ago, no DM per report.  The patient denies chest pain, chest pressure, dyspnea at rest, palpitations, PND, orthopnea, or leg swelling. Denies cough, fever, chills. Denies nausea, vomiting. Denies syncope or presyncope. Denies dizziness or lightheadedness.   Past Medical History:  Diagnosis Date   Arthritis    Food impaction of esophagus    Gastric ulcer    Hypertension     Past Surgical History:  Procedure Laterality Date   BIOPSY  01/31/2018   Procedure: BIOPSY;  Surgeon: Ronnette Juniper, MD;  Location: [facility] ENDOSCOPY;  Service: Gastroenterology;;   ESOPHAGOGASTRODUODENOSCOPY N/A 01/31/2018   Procedure: ESOPHAGOGASTRODUODENOSCOPY (EGD);  Surgeon: Ronnette Juniper, MD;  Location: [place];  Service: Gastroenterology;  Laterality: N/A;   FOREIGN BODY REMOVAL  01/31/2018   Procedure: FOREIGN BODY REMOVAL;  Surgeon: Ronnette Juniper, MD;  Location: [place];  Service: Gastroenterology;;   HERNIA REPAIR     MOUTH SURGERY  2019   TOTAL HIP ARTHROPLASTY Right     Current  Medications: Current Meds  Medication Sig   amLODipine (NORVASC) 5 MG tablet TAKE 1 TABLET BY MOUTH DAILY (Patient taking differently: Take 5 mg by mouth in the morning.)   aspirin EC 81 MG tablet Take 81 mg by mouth daily.   Cholecalciferol (VITAMIN D3) 50 MCG (2000 UT) capsule Take 1 capsule (2,000 Units total) by mouth daily.   finasteride (PROSCAR) 5 MG tablet TAKE 1 TABLET DAILY. MUST SEE PROVIDER BEFORE ANY MORE REFILLS. (Patient taking differently: Take 5 mg by mouth in the morning.)   gabapentin (NEURONTIN) 100 MG capsule Take 1-2 capsules (100-200 mg total) by mouth 3 (three) times daily as needed (feet numbness, back pain).   Glucosamine-Chondroitin-MSM (TRIPLE FLEX PO) Take 1 tablet by mouth daily.   lisinopril-hydrochlorothiazide (ZESTORETIC) 20-12.5 MG tablet TAKE 1 TABLET BY MOUTH DAILY (Patient taking differently: Take 1 tablet by mouth in the morning.)   metoprolol tartrate (LOPRESSOR) 100 MG tablet Take 1 tablet (100 mg total) by mouth once for 1 dose. PLEASE TAKE METOPROLOL 2  HOURS PRIOR TO CTA SCAN.   Multiple Vitamins-Minerals (CENTRUM SILVER 50+MEN) TABS Take 1 tablet by mouth daily with breakfast.   Omega-3 Fatty Acids (FISH OIL) 1000 MG CAPS Take 1,000 mg by mouth daily.   pantoprazole (PROTONIX) 40 MG tablet TAKE 1 TABLET(40 MG) BY MOUTH DAILY (Patient taking differently: Take 40 mg by mouth daily before breakfast.)   sildenafil (  VIAGRA) 100 MG tablet Take 1 tablet (100 mg total) by mouth daily as needed for erectile dysfunction.   UNABLE TO FIND Med Name: Neuropaquell   VITAMIN E PO Take 1 capsule by mouth daily.     Allergies:   Codeine, Gabapentin, Hydrocodone, Oxycodone, and Tramadol   Social History   Tobacco Use   Smoking status: Never   Smokeless tobacco: Never  Vaping Use   Vaping Use: Never used  Substance Use Topics   Alcohol use: Yes    Comment: occasional   Drug use: Never     Family History: The patient's family history is negative for Colon  cancer, Esophageal cancer, Pancreatic cancer, Stomach cancer, and Liver disease.  ROS:   Please see the history of present illness.    All other systems reviewed and are negative.  EKGs/Labs/Other Studies Reviewed:    The following studies were reviewed today:  EKG:  SR, PAC  Imaging studies that I have independently reviewed today: CT angio chest PE 01/24/21.  Recent Labs: 12/02/2020: ALT 22; TSH 2.64 01/24/2021: B Natriuretic Peptide 65.7; BUN 14; Creatinine, Ser 0.97; Hemoglobin 15.7; Platelets 262; Potassium 4.1; Sodium 139  Recent Lipid Panel    Component Value Date/Time   CHOL 152 12/02/2020 1142   TRIG 82.0 12/02/2020 1142   HDL 45.80 12/02/2020 1142   CHOLHDL 3 12/02/2020 1142   VLDL 16.4 12/02/2020 1142   LDLCALC 90 12/02/2020 1142    Physical Exam:    VS:  BP 140/90 (BP Location: Right Arm)   Pulse 73   Ht '5\' 10"'$  (1.778 m)   Wt 207 lb 3.2 oz (94 kg)   SpO2 97%   BMI 29.73 kg/m     Wt Readings from Last 5 Encounters:  02/01/21 207 lb 3.2 oz (94 kg)  01/26/21 207 lb 6.4 oz (94.1 kg)  12/02/20 210 lb 12.8 oz (95.6 kg)  11/30/20 209 lb 9.6 oz (95.1 kg)  09/16/20 211 lb 6 oz (95.9 kg)    Constitutional: No acute distress Eyes: sclera non-icteric, normal conjunctiva and lids ENMT: normal dentition, moist mucous membranes Cardiovascular: regular rhythm, normal rate, no murmurs. S1 and S2 normal. Radial pulses normal bilaterally. No jugular venous distention.  Respiratory: clear to auscultation bilaterally GI : normal bowel sounds, soft and nontender. No distention.   MSK: extremities warm, well perfused. No edema.  NEURO: grossly nonfocal exam, moves all extremities. PSYCH: alert and oriented x 3, normal mood and affect.   ASSESSMENT:    1. Dyspnea on exertion   2. Coronary artery calcification   3. Essential hypertension    PLAN:    Dyspnea on exertion - Plan: EKG 12-Lead, CT CORONARY MORPH W/CTA COR W/SCORE W/CA W/CM &/OR WO/CM, Basic metabolic panel,  ECHOCARDIOGRAM COMPLETE  -strong suspicion for obstructive CAD based on exertional symptoms, DOE as anginal equivalent. Discussed stress options in detail with patient, he does not feel comfortable doing treadmill stress due to "having 3 friends die on the treadmill". We will perform CCTA then,expecting CAD anatomically with cor cal on CTA recently. Will also obtain echo for DOE for diastolic function and biventricular systolic function as well as pulmonary pressures.  Essential hypertension - BP is elevated. This may be playing a role in symptoms. Will review ischemic and structural testing and make further recommendations afterward.   Total time of encounter: 45 minutes total time of encounter, including 28 minutes spent in face-to-face patient care on the date of this encounter. This time includes coordination of  care and counseling regarding above mentioned problem list. Remainder of non-face-to-face time involved reviewing chart documents/testing relevant to the patient encounter and documentation in the medical record. I have independently reviewed documentation from referring provider.   Cherlynn Kaiser, MD, [place]   Shared Decision Making/Informed Consent:       Medication Adjustments/Labs and Tests Ordered: Current medicines are reviewed at length with the patient today.  Concerns regarding medicines are outlined above.   Orders Placed This Encounter  Procedures   CT CORONARY MORPH W/CTA COR W/SCORE W/CA W/CM &/OR WO/CM   Basic metabolic panel   EKG [id number]   ECHOCARDIOGRAM COMPLETE    Meds ordered this encounter  Medications   metoprolol tartrate (LOPRESSOR) 100 MG tablet    Sig: Take 1 tablet (100 mg total) by mouth once for 1 dose. PLEASE TAKE METOPROLOL 2  HOURS PRIOR TO CTA SCAN.    Dispense:  1 tablet    Refill:  0    Patient Instructions  Medication Instructions:  PLEASE TAKE METOPROLOL TARTRATE '100mg'$  (2) HOURS PRIOR TO CCTA SCAN  *If  you need a refill on your cardiac medications before your next appointment, please call your pharmacy*  Lab Work: [facility]- [place] CCTA  If you have labs (blood work) drawn today and your tests are completely normal, you will receive your results only by: MyChart Message (if you have MyChart) OR A paper copy in the mail If you have any lab test that is abnormal or we need to change your treatment, we will call you to review the results.  Testing/Procedures: Your physician has requested that you have cardiac CT. Cardiac computed tomography (CT) is a painless test that uses an x-ray machine to take clear, detailed pictures of your heart. For further information please visit [email or web address]. Please follow instruction sheet as given.  Your physician has requested that you have an echocardiogram. Echocardiography is a painless test that uses sound waves to create images of your heart. It provides your doctor with information about the size and shape of your heart and how well your heart's chambers and valves are working. You may receive an ultrasound enhancing agent through an IV if needed to better visualize your heart during the echo.This procedure takes approximately one hour. There are no restrictions for this procedure. This will take place at the [address].   Follow-Up: At [facility], you and your health needs are our priority.  As part of our continuing mission to provide you with exceptional heart care, we have created designated Provider Care Teams.  These Care Teams include your primary Cardiologist (physician) and Advanced Practice Providers (APPs -  Physician Assistants and Nurse Practitioners) who all work together to provide you with the care you need, when you need it.  Your next appointment:   AFTER TESTING  The format for your next appointment:   In Person  Provider:   Cherlynn Kaiser, MD  Other Instructions   Your cardiac CT will be  scheduled at one of the below locations:   [facility] [address] [place], [place] [address] [phone]  If scheduled at [facility], please arrive at the [facility] main entrance (entrance A) of [facility] 30 minutes prior to test start time. Proceed to the [facility] Radiology Department (first floor) to check-in and test prep.  If scheduled at [facility], please arrive 15 mins early for check-in  and test prep.  Please follow these instructions carefully (unless otherwise directed):  Hold all erectile dysfunction medications at least 3 days (72 hrs) prior to test.  On the Night Before the Test: Be sure to Drink plenty of water. Do not consume any caffeinated/decaffeinated beverages or chocolate 12 hours prior to your test. Do not take any antihistamines 12 hours prior to your test.  On the Day of the Test: Drink plenty of water until 1 hour prior to the test. Do not eat any food 4 hours prior to the test. You may take your regular medications prior to the test.  Take metoprolol (Lopressor) '100mg'$  two hours prior to test. HOLD Furosemide/Hydrochlorothiazide morning of the test.  After the Test: Drink plenty of water. After receiving IV contrast, you may experience a mild flushed feeling. This is normal. On occasion, you may experience a mild rash up to 24 hours after the test. This is not dangerous. If this occurs, you can take Benadryl 25 mg and increase your fluid intake. If you experience trouble breathing, this can be serious. If it is severe call 911 IMMEDIATELY. If it is mild, please call our office. If you take any of these medications: Glipizide/Metformin, Avandament, Glucavance, please do not take 48 hours after completing test unless otherwise instructed.  Please allow 2-4 weeks for scheduling of routine cardiac CTs. Some insurance companies require a pre-authorization which may delay scheduling of this  test.   For non-scheduling related questions, please contact the cardiac imaging nurse navigator should you have any questions/concerns: Marchia Bond, Cardiac Imaging Nurse Navigator Gordy Clement, Cardiac Imaging Nurse Navigator [facility] Direct Office Dial: [phone]   For scheduling needs, including cancellations and rescheduling, please call [place], [phone].

## 2021-02-02 ENCOUNTER — Telehealth (HOSPITAL_COMMUNITY): Payer: Self-pay | Admitting: Emergency Medicine

## 2021-02-02 NOTE — Telephone Encounter (Signed)
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

## 2021-02-04 ENCOUNTER — Other Ambulatory Visit: Payer: Self-pay | Admitting: Internal Medicine

## 2021-02-04 ENCOUNTER — Ambulatory Visit (HOSPITAL_COMMUNITY)
Admission: RE | Admit: 2021-02-04 | Discharge: 2021-02-04 | Disposition: A | Discharge status: Home / Self Care | Payer: Medicare Other | Source: Ambulatory Visit | Attending: Internal Medicine | Admitting: Internal Medicine

## 2021-02-04 ENCOUNTER — Other Ambulatory Visit: Payer: Self-pay

## 2021-02-04 DIAGNOSIS — R931 Abnormal findings on diagnostic imaging of heart and coronary circulation: Secondary | ICD-10-CM

## 2021-02-04 DIAGNOSIS — I2510 Atherosclerotic heart disease of native coronary artery without angina pectoris: Secondary | ICD-10-CM | POA: Insufficient documentation

## 2021-02-04 DIAGNOSIS — I700 Atherosclerosis of aorta: Secondary | ICD-10-CM | POA: Insufficient documentation

## 2021-02-04 DIAGNOSIS — I517 Cardiomegaly: Secondary | ICD-10-CM | POA: Insufficient documentation

## 2021-02-04 DIAGNOSIS — R0609 Other forms of dyspnea: Secondary | ICD-10-CM

## 2021-02-04 DIAGNOSIS — R0600 Dyspnea, unspecified: Secondary | ICD-10-CM | POA: Diagnosis not present

## 2021-02-04 MED ORDER — NITROGLYCERIN 0.4 MG SL SUBL
SUBLINGUAL_TABLET | SUBLINGUAL | Status: AC
  Administered 2021-02-04: 0.8 mg via SUBLINGUAL
  Filled 2021-02-04: qty 2

## 2021-02-04 MED ORDER — NITROGLYCERIN 0.4 MG SL SUBL: 0.8000 mg | SUBLINGUAL_TABLET | Freq: Once | SUBLINGUAL | Status: AC

## 2021-02-04 MED ORDER — IOHEXOL 350 MG/ML SOLN
95.0000 mL | Freq: Once | INTRAVENOUS | Status: AC | PRN
  Administered 2021-02-04: 95 mL via INTRAVENOUS

## 2021-02-08 ENCOUNTER — Other Ambulatory Visit: Payer: Self-pay

## 2021-02-08 ENCOUNTER — Ambulatory Visit (HOSPITAL_COMMUNITY)
Admission: RE | Admit: 2021-02-08 | Discharge: 2021-02-08 | Disposition: A | Discharge status: Home / Self Care | Payer: Medicare Other | Source: Ambulatory Visit | Attending: Internal Medicine | Admitting: Internal Medicine

## 2021-02-08 DIAGNOSIS — I340 Nonrheumatic mitral (valve) insufficiency: Secondary | ICD-10-CM | POA: Insufficient documentation

## 2021-02-08 DIAGNOSIS — R0609 Other forms of dyspnea: Secondary | ICD-10-CM | POA: Diagnosis not present

## 2021-02-08 DIAGNOSIS — R0600 Dyspnea, unspecified: Secondary | ICD-10-CM | POA: Diagnosis not present

## 2021-02-08 DIAGNOSIS — I2510 Atherosclerotic heart disease of native coronary artery without angina pectoris: Secondary | ICD-10-CM | POA: Diagnosis not present

## 2021-02-08 LAB — ECHOCARDIOGRAM COMPLETE
AR max vel: 3.62 cm2
AV Area VTI: 3.25 cm2
AV Area mean vel: 3.35 cm2
AV Mean grad: 3 mmHg
AV Peak grad: 5.5 mmHg
Ao pk vel: 1.17 m/s
Area-P 1/2: 3.31 cm2
S' Lateral: 3.2 cm

## 2021-02-08 MED ORDER — PERFLUTREN LIPID MICROSPHERE
1.0000 mL | INTRAVENOUS | Status: AC | PRN
  Administered 2021-02-08: 2 mL via INTRAVENOUS
  Filled 2021-02-08: qty 10

## 2021-02-10 ENCOUNTER — Ambulatory Visit: Payer: Medicare Other | Admitting: Internal Medicine

## 2021-02-10 ENCOUNTER — Other Ambulatory Visit: Payer: Self-pay

## 2021-02-10 ENCOUNTER — Encounter: Payer: Self-pay | Admitting: Internal Medicine

## 2021-02-10 VITALS — BP 110/80 | HR 64 | Ht 70.0 in | Wt 210.2 lb

## 2021-02-10 DIAGNOSIS — I10 Essential (primary) hypertension: Secondary | ICD-10-CM | POA: Diagnosis not present

## 2021-02-10 DIAGNOSIS — R0600 Dyspnea, unspecified: Secondary | ICD-10-CM

## 2021-02-10 DIAGNOSIS — R931 Abnormal findings on diagnostic imaging of heart and coronary circulation: Secondary | ICD-10-CM

## 2021-02-10 DIAGNOSIS — R0609 Other forms of dyspnea: Secondary | ICD-10-CM

## 2021-02-10 DIAGNOSIS — I2510 Atherosclerotic heart disease of native coronary artery without angina pectoris: Secondary | ICD-10-CM | POA: Diagnosis not present

## 2021-02-10 DIAGNOSIS — I2584 Coronary atherosclerosis due to calcified coronary lesion: Secondary | ICD-10-CM

## 2021-02-10 DIAGNOSIS — I25119 Atherosclerotic heart disease of native coronary artery with unspecified angina pectoris: Secondary | ICD-10-CM

## 2021-02-10 MED ORDER — METOPROLOL SUCCINATE ER 25 MG PO TB24: 25.0000 mg | ORAL_TABLET | Freq: Every day | ORAL | 3 refills | Status: DC

## 2021-02-10 MED ORDER — ROSUVASTATIN CALCIUM 5 MG PO TABS: 5.0000 mg | ORAL_TABLET | Freq: Every day | ORAL | 3 refills | Status: DC

## 2021-02-10 NOTE — Patient Instructions (Signed)
Medication Instructions:  START: ROSUVASTATIN 5 MG DAILY  A FEW DAYS LATER: START METOPROLOL SUCCINATE '25mg'$  DAILY  *If you need a refill on your cardiac medications before your next appointment, please call your pharmacy*  Lab Work: PLEASE RETURN FOR REPEAT FASTING LAB WORK PRIOR TO NEXT APPOINTMENT- YOU WILL GET A REMINDER LETTER  AND LAB SLIPS IN [place]  If you have labs (blood work) drawn today and your tests are completely normal, you will receive your results only by: MyChart Message (if you have MyChart) OR A paper copy in the mail If you have any lab test that is abnormal or we need to change your treatment, we will call you to review the results.  Testing/Procedures: Your physician has requested that you have an echocardiogram IN 3 MONTHS . Echocardiography is a painless test that uses sound waves to create images of your heart. It provides your doctor with information about the size and shape of your heart and how well your heart's chambers and valves are working. You may receive an ultrasound enhancing agent through an IV if needed to better visualize your heart during the echo.This procedure takes approximately one hour. There are no restrictions for this procedure. This will take place at the [address], [address].   Follow-Up: At [facility], you and your health needs are our priority.  As part of our continuing mission to provide you with exceptional heart care, we have created designated Provider Care Teams.  These Care Teams include your primary Cardiologist (physician) and Advanced Practice Providers (APPs -  Physician Assistants and Nurse Practitioners) who all work together to provide you with the care you need, when you need it.  Your next appointment:   3 month(s)  The format for your next appointment:   In Person  Provider:   Oswaldo Milian, MD  Other Instructions IF YOUT BLOOD PRESSURE REMAINS LOWER THAN 100/60 THEN YOU MAY STOP YOUR AMLODIPINE-  [place] [phone]

## 2021-02-10 NOTE — Progress Notes (Signed)
Cardiology Office Note:    Date:  02/01/2021   ID:  Mario Huerta, DOB 20-Feb-1941, MRN [id number]  PCP:  Mario Anger, MD  Cardiologist:  None  Electrophysiologist:  None   Referring MD: Mario Anger, MD   Chief Complaint/Reason for Referral: DOE  History of Present Illness:    Mario Huerta is a 80 y.o. male with a history of HTN. He presents for follow-up of dyspnea on exertion.  Since last visit dyspnea on exertion has not significantly changed.  He is overall feeling stable with approximately class II dyspnea symptoms.  His daughter Mario Huerta presents today with him and provides collaborative history and asks pertinent questions.  We reviewed results of coronary CTA which showed concern for severe CAD in the prox ramus intermedius and moderate CAD in the mid LAD - CT FFR showed no flow limiting lesions in areas of concern. There was a small distal flow limiting lesion in the distal LAD, moderate anatomic stenosis by CTA. Mild dilation at sinuses, 41 mm.  We reviewed guideline directed medical therapy in detail. He is not inclined to take much medication and he and his daughter are wary of statins. We discussed MOA and pleotropic effects of statins in secondary prevention of CAD.    Past Medical History:  Diagnosis Date   Arthritis    Food impaction of esophagus    Gastric ulcer    Hypertension     Past Surgical History:  Procedure Laterality Date   BIOPSY  01/31/2018   Procedure: BIOPSY;  Surgeon: Mario Juniper, MD;  Location: [facility] ENDOSCOPY;  Service: Gastroenterology;;   ESOPHAGOGASTRODUODENOSCOPY N/A 01/31/2018   Procedure: ESOPHAGOGASTRODUODENOSCOPY (EGD);  Surgeon: Mario Juniper, MD;  Location: [place];  Service: Gastroenterology;  Laterality: N/A;   FOREIGN BODY REMOVAL  01/31/2018   Procedure: FOREIGN BODY REMOVAL;  Surgeon: Mario Juniper, MD;  Location: [place];  Service: Gastroenterology;;   HERNIA REPAIR     MOUTH SURGERY  2019   TOTAL HIP  ARTHROPLASTY Right     Current Medications: Current Meds  Medication Sig   AMBULATORY NON FORMULARY MEDICATION [employer] Mushroom capsules 2 capsules by mouth daily   amLODipine (NORVASC) 5 MG tablet TAKE 1 TABLET BY MOUTH DAILY (Patient taking differently: Take 5 mg by mouth in the morning.)   aspirin EC 81 MG tablet Take 81 mg by mouth daily.   Cholecalciferol (VITAMIN D3) 50 MCG (2000 UT) capsule Take 1 capsule (2,000 Units total) by mouth daily.   finasteride (PROSCAR) 5 MG tablet TAKE 1 TABLET DAILY. MUST SEE PROVIDER BEFORE ANY MORE REFILLS. (Patient taking differently: Take 5 mg by mouth in the morning.)   Glucosamine-Chondroitin-MSM (TRIPLE FLEX PO) Take 1 tablet by mouth daily.   lisinopril-hydrochlorothiazide (ZESTORETIC) 20-12.5 MG tablet TAKE 1 TABLET BY MOUTH DAILY (Patient taking differently: Take 1 tablet by mouth in the morning.)   metoprolol succinate (TOPROL-XL) 25 MG 24 hr tablet Take 1 tablet (25 mg total) by mouth daily. Take with or immediately following a meal.   Multiple Vitamins-Minerals (CENTRUM SILVER 50+MEN) TABS Take 1 tablet by mouth daily with breakfast.   Omega-3 Fatty Acids (FISH OIL) 1000 MG CAPS Take 1,000 mg by mouth daily.   rosuvastatin (CRESTOR) 5 MG tablet Take 1 tablet (5 mg total) by mouth daily.   UNABLE TO FIND Med Name: Neuropaquell   VITAMIN E PO Take 1 capsule by mouth daily.   [DISCONTINUED] metoprolol tartrate (LOPRESSOR) 100 MG tablet Take 1 tablet (100 mg  total) by mouth once for 1 dose. PLEASE TAKE METOPROLOL 2  HOURS PRIOR TO CTA SCAN.     Allergies:   Codeine, Gabapentin, Hydrocodone, Oxycodone, and Tramadol   Social History   Tobacco Use   Smoking status: Never   Smokeless tobacco: Never  Vaping Use   Vaping Use: Never used  Substance Use Topics   Alcohol use: Yes    Comment: occasional   Drug use: Never     Family History: The patient's family history is negative for Colon cancer, Esophageal cancer, Pancreatic cancer,  Stomach cancer, and Liver disease.  ROS:   Please see the history of present illness.    All other systems reviewed and are negative.  EKGs/Labs/Other Studies Reviewed:    The following studies were reviewed today:  EKG:  SR, PAC  Imaging studies that I have independently reviewed today: CT angio chest PE 01/24/21.  Recent Labs: 12/02/2020: ALT 22; TSH 2.64 01/24/2021: B Natriuretic Peptide 65.7; BUN 14; Creatinine, Ser 0.97; Hemoglobin 15.7; Platelets 262; Potassium 4.1; Sodium 139  Recent Lipid Panel    Component Value Date/Time   CHOL 152 12/02/2020 1142   TRIG 82.0 12/02/2020 1142   HDL 45.80 12/02/2020 1142   CHOLHDL 3 12/02/2020 1142   VLDL 16.4 12/02/2020 1142   LDLCALC 90 12/02/2020 1142    Physical Exam:    VS:  BP 110/80 (BP Location: Right Arm, Patient Position: Sitting, Cuff Size: Normal)   Pulse 64   Ht '5\' 10"'$  (1.778 m)   Wt 210 lb 3.2 oz (95.3 kg)   SpO2 96%   BMI 30.16 kg/m     Wt Readings from Last 5 Encounters:  02/10/21 210 lb 3.2 oz (95.3 kg)  02/01/21 207 lb 3.2 oz (94 kg)  01/26/21 207 lb 6.4 oz (94.1 kg)  12/02/20 210 lb 12.8 oz (95.6 kg)  11/30/20 209 lb 9.6 oz (95.1 kg)    Constitutional: No acute distress Eyes: sclera non-icteric, normal conjunctiva and lids ENMT: normal dentition, moist mucous membranes Cardiovascular: regular rhythm, normal rate, no murmurs. S1 and S2 normal. Radial pulses normal bilaterally. No jugular venous distention.  Respiratory: clear to auscultation bilaterally GI : normal bowel sounds, soft and nontender. No distention.   MSK: extremities warm, well perfused. No edema.  NEURO: grossly nonfocal exam, moves all extremities. PSYCH: alert and oriented x 3, normal mood and affect.   ASSESSMENT:    1. Decreased cardiac ejection fraction   2. Coronary artery calcification   3. Dyspnea on exertion   4. Essential hypertension   5. 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 Will aim for secondary prevention of CAD and aggressive medical therapy.  -LVEF mildly reduced with regional wall motion, concern for prior small infarct. - initiate crestor 5 mg daily and check labs 3 mo - initiate metoprolol succcinate 25 mg daily for mildly reduced EF and CAD. We will plan to repeat an echo in 3 mo. Already on ACE-I, continue lisinopril hctz 20-12.5 mg.   Total time of encounter: 45 minutes total time of encounter, including 30 minutes spent in face-to-face patient  care on the date of this encounter. This time includes coordination of care and counseling regarding above mentioned problem list. Remainder of non-face-to-face time involved reviewing chart documents/testing relevant to the patient encounter and documentation in the medical record. I have independently reviewed documentation from referring provider.   Cherlynn Kaiser, MD, [place]    Shared Decision Making/Informed Consent:       Medication Adjustments/Labs and Tests Ordered: Current medicines are reviewed at length with the patient today.  Concerns regarding medicines are outlined above.   Orders Placed This Encounter  Procedures   Lipid panel   Comprehensive metabolic panel   ECHOCARDIOGRAM COMPLETE    Meds ordered this encounter  Medications   rosuvastatin (CRESTOR) 5 MG tablet    Sig: Take 1 tablet (5 mg total) by mouth daily.    Dispense:  90 tablet    Refill:  3   metoprolol succinate (TOPROL-XL) 25 MG 24 hr tablet    Sig: Take 1 tablet (25 mg total) by mouth  daily. Take with or immediately following a meal.    Dispense:  90 tablet    Refill:  3    Patient Instructions  Medication Instructions:  START: ROSUVASTATIN 5 MG DAILY  A FEW DAYS LATER: START METOPROLOL SUCCINATE '25mg'$  DAILY  *If you need a refill on your cardiac medications before your next appointment, please call your pharmacy*  Lab Work: PLEASE RETURN FOR REPEAT FASTING LAB WORK PRIOR TO NEXT APPOINTMENT- YOU WILL GET A REMINDER LETTER  AND LAB SLIPS IN [place]  If you have labs (blood work) drawn today and your tests are completely normal, you will receive your results only by: MyChart Message (if you have MyChart) OR A paper copy in the mail If you have any lab test that is abnormal or we need to change your treatment, we will call you to review the results.  Testing/Procedures: Your physician has requested that you have an echocardiogram IN 3 MONTHS . Echocardiography is a painless test that uses sound waves to create images of your heart. It provides your doctor with information about the size and shape of your heart and how well your heart's chambers and valves are working. You may receive an ultrasound enhancing agent through an IV if needed to better visualize your heart during the echo.This procedure takes approximately one hour. There are no restrictions for this procedure. This will take place at the [address], [address].   Follow-Up: At [facility], you and your health needs are our priority.  As part of our continuing mission to provide you with exceptional heart care, we have created designated Provider Care Teams.  These Care Teams include your primary Cardiologist (physician) and Advanced Practice Providers (APPs -  Physician Assistants and Nurse Practitioners) who all work together to provide you with the care you need, when you need it.  Your next appointment:   3 month(s)  The format for your next appointment:   In Person  Provider:   Oswaldo Milian, MD  Other Instructions IF YOUT BLOOD PRESSURE REMAINS LOWER THAN 100/60 THEN YOU MAY STOP YOUR AMLODIPINE- [place] [phone]

## 2021-02-22 ENCOUNTER — Other Ambulatory Visit (HOSPITAL_COMMUNITY): Payer: Medicare Other

## 2021-03-03 ENCOUNTER — Telehealth: Payer: Self-pay

## 2021-03-03 NOTE — Telephone Encounter (Signed)
Called patient's daughter [name] (okay per DPR) to see how patient is tolerating the Crestor 5mg  that he recently started. Per patients daughter he has felt the same, still fatigued. Per patients daughter he has had no muscle aches or pains, no other side effects or worsening of symptoms since starting it. Advised patients daughter I would forward message to Dr. Margaretann Loveless for review and advice on whether dose should be increased. Patients daughter verbalized understanding.

## 2021-03-08 MED ORDER — ROSUVASTATIN CALCIUM 10 MG PO TABS: 10.0000 mg | ORAL_TABLET | Freq: Every day | ORAL | 3 refills | Status: DC

## 2021-03-08 NOTE — Telephone Encounter (Deleted)
Mario Munroe, MD  You 4 days ago   Yes increase crestor to 10 mg daily.

## 2021-03-08 NOTE — Addendum Note (Signed)
Addended by: Rexanne Mano B on: 03/08/2021 11:14 AM   Modules accepted: Orders

## 2021-03-08 NOTE — Telephone Encounter (Addendum)
Returned call to patients daughter Lambert Keto (okay per [facility]) advised her of Dr. Delphina Cahill recommendations. Updated prescription sent to patient preferred pharmacy. Advised patient's to call back to office with any issues, questions, or concerns. Patient's daughter verbalized understanding.    Elouise Munroe, MD  You 4 days ago   Yes increase crestor to 10 mg daily.

## 2021-03-24 ENCOUNTER — Other Ambulatory Visit: Payer: Self-pay | Admitting: Internal Medicine

## 2021-03-24 ENCOUNTER — Ambulatory Visit: Payer: Medicare Other | Admitting: Internal Medicine

## 2021-03-25 ENCOUNTER — Other Ambulatory Visit: Payer: Self-pay | Admitting: Internal Medicine

## 2021-03-25 ENCOUNTER — Telehealth: Payer: Self-pay | Admitting: Internal Medicine

## 2021-03-25 NOTE — Telephone Encounter (Signed)
Correct, no interaction between atorvastatin and amlodipine

## 2021-03-25 NOTE — Telephone Encounter (Signed)
Spoke with patient's daughter  She is unsure if patient is to continue amlodipine (of note, refilled by PCP today) She thought he was to stop it b/c of something to do with statin Per last visit AVS, was to stop based on BP He is on crestor 10mg  daily  Advised will confirm with pharmacy team, but I am not aware of interaction with amlodipine and rosuvastatin

## 2021-03-25 NOTE — Telephone Encounter (Signed)
   Pt c/o medication issue:  1. Name of Medication:   amLODipine (NORVASC) 5 MG tablet    2. How are you currently taking this medication (dosage and times per day)? TAKE 1 TABLET BY MOUTH DAILY  3. Are you having a reaction (difficulty breathing--STAT)?   4. What is your medication issue? Pt's daughter calling, she said they are confused of this meds they wanted to know if the pt needs to continue taking this.

## 2021-03-25 NOTE — Telephone Encounter (Signed)
Per secure chat, confirmed with pharmacist that there is NO interaction w/rosuvastatin + amlodipine  Left message with this info

## 2021-04-11 ENCOUNTER — Other Ambulatory Visit: Payer: Self-pay

## 2021-04-11 DIAGNOSIS — I2510 Atherosclerotic heart disease of native coronary artery without angina pectoris: Secondary | ICD-10-CM

## 2021-04-11 DIAGNOSIS — I2584 Coronary atherosclerosis due to calcified coronary lesion: Secondary | ICD-10-CM

## 2021-05-09 ENCOUNTER — Ambulatory Visit (HOSPITAL_COMMUNITY): Payer: Medicare Other | Attending: Cardiology

## 2021-05-09 ENCOUNTER — Other Ambulatory Visit: Payer: Self-pay

## 2021-05-09 DIAGNOSIS — R931 Abnormal findings on diagnostic imaging of heart and coronary circulation: Secondary | ICD-10-CM | POA: Diagnosis not present

## 2021-05-09 DIAGNOSIS — I2510 Atherosclerotic heart disease of native coronary artery without angina pectoris: Secondary | ICD-10-CM

## 2021-05-09 LAB — ECHOCARDIOGRAM COMPLETE
Area-P 1/2: 3.72 cm2
S' Lateral: 3.5 cm

## 2021-05-09 MED ORDER — PERFLUTREN LIPID MICROSPHERE
1.0000 mL | INTRAVENOUS | Status: AC | PRN
  Administered 2021-05-09: 3 mL via INTRAVENOUS

## 2021-05-11 ENCOUNTER — Telehealth: Payer: Self-pay | Admitting: Internal Medicine

## 2021-05-11 NOTE — Telephone Encounter (Signed)
Patient's daughter wants to know if her father needs to come in for a medication adjustment for his cholesterol meds and go over his echo.   I offered her an appt for this coming Friday, but she can't bring him in that day.  Next available appt with APP is February, and with Dr. Margaretann Loveless is April.

## 2021-05-11 NOTE — Telephone Encounter (Signed)
Spoke with daughter Vida Roller who wanted to know about the echo results and cholesterol medication adjustment. Advised daughter that fasting blood work ordered in November needed to be drawn and that the echo results have not been reviewed yet. As Soon as these are done, she will be called to schedule an appointment. She voiced understanding.

## 2021-05-12 NOTE — Telephone Encounter (Signed)
Please see result note- patient's daughter (okay per DPR) is aware of results and all instructions

## 2021-05-13 DIAGNOSIS — I2510 Atherosclerotic heart disease of native coronary artery without angina pectoris: Secondary | ICD-10-CM | POA: Diagnosis not present

## 2021-05-13 DIAGNOSIS — I2584 Coronary atherosclerosis due to calcified coronary lesion: Secondary | ICD-10-CM | POA: Diagnosis not present

## 2021-05-14 LAB — LIPID PANEL
Chol/HDL Ratio: 2.3 ratio (ref 0.0–5.0)
Cholesterol, Total: 106 mg/dL (ref 100–199)
HDL: 47 mg/dL (ref >39)
LDL Chol Calc (NIH): 46 mg/dL (ref 0–99)
Triglycerides: 58 mg/dL (ref 0–149)
VLDL Cholesterol Cal: 13 mg/dL (ref 5–40)

## 2021-05-14 LAB — COMPREHENSIVE METABOLIC PANEL
ALT: 20 IU/L (ref 0–44)
AST: 30 IU/L (ref 0–40)
Albumin/Globulin Ratio: 1.9 (ref 1.2–2.2)
Albumin: 4 g/dL (ref 3.6–4.6)
Alkaline Phosphatase: 105 IU/L (ref 44–121)
BUN/Creatinine Ratio: 13 (ref 10–24)
BUN: 13 mg/dL (ref 8–27)
Bilirubin Total: 0.8 mg/dL (ref 0.0–1.2)
CO2: 21 mmol/L (ref 20–29)
Calcium: 8.8 mg/dL (ref 8.6–10.2)
Chloride: 105 mmol/L (ref 96–106)
Creatinine, Ser: 0.98 mg/dL (ref 0.76–1.27)
Globulin, Total: 2.1 g/dL (ref 1.5–4.5)
Glucose: 93 mg/dL (ref 70–99)
Potassium: 4 mmol/L (ref 3.5–5.2)
Sodium: 141 mmol/L (ref 134–144)
Total Protein: 6.1 g/dL (ref 6.0–8.5)
eGFR: 77 mL/min/{1.73_m2} (ref >59)

## 2021-07-22 ENCOUNTER — Other Ambulatory Visit: Payer: Self-pay | Admitting: Gastroenterology

## 2021-07-22 ENCOUNTER — Other Ambulatory Visit: Payer: Self-pay | Admitting: Internal Medicine

## 2021-09-19 ENCOUNTER — Encounter: Payer: Self-pay | Admitting: Internal Medicine

## 2021-09-19 ENCOUNTER — Ambulatory Visit: Payer: Medicare Other | Admitting: Internal Medicine

## 2021-09-19 VITALS — BP 138/86 | HR 61 | Ht 70.0 in | Wt 214.0 lb

## 2021-09-19 DIAGNOSIS — I2510 Atherosclerotic heart disease of native coronary artery without angina pectoris: Secondary | ICD-10-CM

## 2021-09-19 DIAGNOSIS — I10 Essential (primary) hypertension: Secondary | ICD-10-CM | POA: Diagnosis not present

## 2021-09-19 DIAGNOSIS — R931 Abnormal findings on diagnostic imaging of heart and coronary circulation: Secondary | ICD-10-CM

## 2021-09-19 DIAGNOSIS — Z79899 Other long term (current) drug therapy: Secondary | ICD-10-CM

## 2021-09-19 DIAGNOSIS — R0609 Other forms of dyspnea: Secondary | ICD-10-CM | POA: Diagnosis not present

## 2021-09-19 NOTE — Patient Instructions (Signed)
Medication Instructions:  No Changes In Medications at this time.   *If you need a refill on your cardiac medications before your next appointment, please call your pharmacy*  Follow-Up: At [facility], you and your health needs are our priority.  As part of our continuing mission to provide you with exceptional heart care, we have created designated Provider Care Teams.  These Care Teams include your primary Cardiologist (physician) and Advanced Practice Providers (APPs -  Physician Assistants and Nurse Practitioners) who all work together to provide you with the care you need, when you need it.  Your next appointment:    1 year(s)  The format for your next appointment:   In Person  Provider:   Gayatri A Acharya, MD          

## 2021-09-19 NOTE — Progress Notes (Signed)
?Cardiology Office Note:   ? ?Date: 09/19/21 ? ?ID:  Mario Huerta, DOB [birth year], MRN [id number] ? ?PCP:  Plotnikov, Evie Lacks, MD  ?Cardiologist:  None  ?Electrophysiologist:  None  ? ?Referring MD: Cassandria Anger, MD  ? ?Chief Complaint/Reason for Referral: ?DOE ? ?History of Present Illness:   ? ?Mario Huerta is a 81 y.o. male with a history of HTN. He presents for follow-up of dyspnea on exertion. ? ?We reviewed results of coronary CTA which showed concern for severe CAD in the prox ramus intermedius and moderate CAD in the mid LAD - CT FFR showed no flow limiting lesions in areas of concern. There was a small distal flow limiting lesion in the distal LAD, moderate anatomic stenosis by CTA. Mild dilation at sinuses, 41 mm. ? ?At his last appointment 02/10/2021, his dyspnea on exertion had not significantly changed.  He felt stable with approximately class II dyspnea symptoms. We reviewed guideline directed medical therapy in detail. He was not inclined to take much medication and he and his daughter were wary of statins. We discussed MOA and pleotropic effects of statins in secondary prevention of CAD. ? ?His daughter Mario Huerta presents today with him and provides collaborative history and asks pertinent questions. ? ?He is feeling fine aside from ongoing (5+ years) numbness in his feet. He has seen a neurologist, and it was thought his symptoms were due to a spinal issue. He denies any associated pain, and confirms he is still able to do what he wants. He continues to stay active with yard work, recently climbed a ladder. No chest pains or pressure with exertion. ? ?At this time he states his shortness of breath is "about the same". He has never really noticed much improvement. Walking from the parking lot to the office today did cause him to be short winded. If he does become severely short of breath, he will stop and take some time with deep inspiration. This helps him recover and he will continue. He  was never a smoker.  We have again discussed that his burden of coronary disease should not explain the degree of his shortness of breath given no proximal severe lesions, however I do wonder if it provokes exertional diastolic dysfunction which may contribute to shortness of breath.  He feels unless significant measures are needed such as cardiac cath, he feels he is stable and does not desire further work-up at this time. ? ?He has been compliant with the increased dose of Crestor, 10 mg, with no adverse effects.  ? ?He denies any palpitations, chest pain, or peripheral edema. No lightheadedness, headaches, syncope, orthopnea, or PND. ? ? ?Past Medical History:  ?Diagnosis Date  ? Arthritis   ? Food impaction of esophagus   ? Gastric ulcer   ? Hypertension   ? ? ?Past Surgical History:  ?Procedure Laterality Date  ? BIOPSY  01/31/2018  ? Procedure: BIOPSY;  Surgeon: Ronnette Juniper, MD;  Location: [facility] ENDOSCOPY;  Service: Gastroenterology;;  ? ESOPHAGOGASTRODUODENOSCOPY N/A 01/31/2018  ? Procedure: ESOPHAGOGASTRODUODENOSCOPY (EGD);  Surgeon: Ronnette Juniper, MD;  Location: [place];  Service: Gastroenterology;  Laterality: N/A;  ? FOREIGN BODY REMOVAL  01/31/2018  ? Procedure: FOREIGN BODY REMOVAL;  Surgeon: Ronnette Juniper, MD;  Location: [place];  Service: Gastroenterology;;  ? HERNIA REPAIR    ? MOUTH SURGERY  2019  ? TOTAL HIP ARTHROPLASTY Right   ? ? ?Current Medications: ?Current Meds  ?Medication Sig  ? AMBULATORY NON FORMULARY MEDICATION [employer]  Mushroom capsules ?2 capsules by mouth daily  ? amLODipine (NORVASC) 5 MG tablet TAKE 1 TABLET BY MOUTH DAILY  ? aspirin EC 81 MG tablet Take 81 mg by mouth daily.  ? Cholecalciferol (VITAMIN D3) 50 MCG (2000 UT) capsule Take 1 capsule (2,000 Units total) by mouth daily.  ? finasteride (PROSCAR) 5 MG tablet TAKE 1 TABLET DAILY. MUST SEE PROVIDER BEFORE ANY MORE REFILLS. (Patient taking differently: Take 5 mg by mouth in the morning.)  ? Glucosamine-Chondroitin-MSM  (TRIPLE FLEX PO) Take 1 tablet by mouth daily.  ? lisinopril-hydrochlorothiazide (ZESTORETIC) 20-12.5 MG tablet TAKE 1 TABLET BY MOUTH DAILY  ? metoprolol succinate (TOPROL-XL) 25 MG 24 hr tablet Take 1 tablet (25 mg total) by mouth daily. Take with or immediately following a meal.  ? Multiple Vitamins-Minerals (CENTRUM SILVER 50+MEN) TABS Take 1 tablet by mouth daily with breakfast.  ? Omega-3 Fatty Acids (FISH OIL) 1000 MG CAPS Take 1,000 mg by mouth daily.  ? pantoprazole (PROTONIX) 40 MG tablet TAKE 1 TABLET(40 MG) BY MOUTH DAILY  ? rosuvastatin (CRESTOR) 10 MG tablet Take 1 tablet (10 mg total) by mouth daily.  ? UNABLE TO FIND Med Name: Neuropaquell  ? VITAMIN E PO Take 1 capsule by mouth daily.  ?  ? ?Allergies:   Codeine, Gabapentin, Hydrocodone, Oxycodone, and Tramadol  ? ?Social History  ? ?Tobacco Use  ? Smoking status: Never  ? Smokeless tobacco: Never  ?Vaping Use  ? Vaping Use: Never used  ?Substance Use Topics  ? Alcohol use: Yes  ?  Comment: occasional  ? Drug use: Never  ?  ? ?Family History: ?The patient's family history is negative for Colon cancer, Esophageal cancer, Pancreatic cancer, Stomach cancer, and Liver disease. ? ?ROS:   ?Please see the history of present illness.    ?(+) Numbness in bilateral feet ?(+) Shortness of breath ?All other systems reviewed and are negative. ? ?EKGs/Labs/Other Studies Reviewed:   ? ?The following studies were reviewed today: ? ?Echo 05/09/2021: ? 1. Left ventricular ejection fraction, by estimation, is 50 to 55%. The  ?left ventricle has low normal function. The left ventricle demonstrates  ?regional wall motion abnormalities (see scoring diagram/findings for  ?description). Mild hypokinesis of the  ?mid-to-apical septal LV walls. There is mild asymmetric left ventricular  ?hypertrophy of the basal-septal segment. Left ventricular diastolic  ?parameters are consistent with Grade I diastolic dysfunction (impaired  ?relaxation).  ? 2. Right ventricular systolic  function is normal. The right ventricular  ?size is normal.  ? 3. Left atrial size was moderately dilated.  ? 4. The mitral valve is normal in structure. Trivial mitral valve  ?regurgitation. No evidence of mitral stenosis.  ? 5. The aortic valve was not well visualized. Aortic valve regurgitation  ?is not visualized.  ? 6. Aortic dilatation noted. 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 There is mild dilatation of the aortic root,  ?measuring 39 mm.  ? ?Comparison(s): Compared to prior TTE in 07/2020, the LVEF has mildly  ?improved to ~50-55%. Otherwise, there is no significant change.  ? ?CT Coronary 07/07/2020: ?FINDINGS: ?Image quality: Good. ?  ?Noise artifact is: Moderate - decreased signal to noise ratio. ?  ?Coronary calcium score is 508, which places the patient in the 54th ?percentile for age and sex matched control. ?  ?Coronary arteries: Normal coronary origins.  Right dominance. ?  ?Right Coronary Artery: Minimal mixed atherosclerotic plaque in the ?proximal and mid RCA, <25% stenosis. ?  ?  Left Main Coronary Artery: Mild mixed atherosclerotic plaque in the ?ostial left main coronary artery, 25-49% stenosis. ?  ?Left Anterior Descending Coronary Artery: Mild mixed atherosclerotic ?plaque in the ostial LAD, 25-49% stenosis, moderate mixed ?atherosclerotic plaque in the mid LAD 50-69%, moderate mixed ?atherosclerotic plaque in the distal LAD, 50-69% stenosis. ?  ?Ramus intermedius: Ostial to proximal severe mixed atherosclerosis ?in the ramus intermedius, 70-99%. ?  ?Left Circumflex Artery: Mild mixed atherosclerotic plaque in the ?proximal LAD, 25-49% stenosis. Distal vessel is small and difficult ?to interpret with decreased signal to noise ratio. ?  ?Aorta: Mild dilation at the sinus of Valsalva. ?  ?Sinus of Valsalva: ?  ?R-L: 41 mm ?  ?L-Non: 40 mm ?  ?R-Non: 39 mm ?  ?34 mm at the mid ascending aorta (level of the PA bifurcation) ?measured double oblique. Mild calcifications.  No dissection. ?  ?Aortic Valve: No calcifications. Tricuspid aortic valve, normal ?leaflet excursion. ?  ?Other findings: ?  ?Normal pulmonary vein drainage into the left atrium. ?  ?Normal left atrial appe

## 2021-09-29 ENCOUNTER — Encounter: Payer: Self-pay | Admitting: Internal Medicine

## 2021-09-29 ENCOUNTER — Ambulatory Visit (INDEPENDENT_AMBULATORY_CARE_PROVIDER_SITE_OTHER): Payer: Medicare Other | Admitting: Internal Medicine

## 2021-09-29 VITALS — BP 132/86 | HR 63 | Temp 98.7°F | Ht 70.0 in | Wt 215.0 lb

## 2021-09-29 DIAGNOSIS — Z0001 Encounter for general adult medical examination with abnormal findings: Secondary | ICD-10-CM

## 2021-09-29 DIAGNOSIS — G8929 Other chronic pain: Secondary | ICD-10-CM

## 2021-09-29 DIAGNOSIS — N320 Bladder-neck obstruction: Secondary | ICD-10-CM

## 2021-09-29 DIAGNOSIS — M545 Low back pain, unspecified: Secondary | ICD-10-CM | POA: Diagnosis not present

## 2021-09-29 DIAGNOSIS — Z1211 Encounter for screening for malignant neoplasm of colon: Secondary | ICD-10-CM | POA: Diagnosis not present

## 2021-09-29 DIAGNOSIS — Z0000 Encounter for general adult medical examination without abnormal findings: Secondary | ICD-10-CM

## 2021-09-29 DIAGNOSIS — R202 Paresthesia of skin: Secondary | ICD-10-CM

## 2021-09-29 LAB — COMPREHENSIVE METABOLIC PANEL
ALT: 26 U/L (ref 0–53)
AST: 39 U/L — ABNORMAL HIGH (ref 0–37)
Albumin: 4.1 g/dL (ref 3.5–5.2)
Alkaline Phosphatase: 92 U/L (ref 39–117)
BUN: 15 mg/dL (ref 6–23)
CO2: 26 mEq/L (ref 19–32)
Calcium: 8.9 mg/dL (ref 8.4–10.5)
Chloride: 105 mEq/L (ref 96–112)
Creatinine, Ser: 0.97 mg/dL (ref 0.40–1.50)
GFR: 72.94 mL/min (ref >60.00)
Glucose, Bld: 96 mg/dL (ref 70–99)
Potassium: 3.7 mEq/L (ref 3.5–5.1)
Sodium: 138 mEq/L (ref 135–145)
Total Bilirubin: 0.7 mg/dL (ref 0.2–1.2)
Total Protein: 7.1 g/dL (ref 6.0–8.3)

## 2021-09-29 LAB — URINALYSIS
Bilirubin Urine: NEGATIVE
Hgb urine dipstick: NEGATIVE
Ketones, ur: NEGATIVE
Leukocytes,Ua: NEGATIVE
Nitrite: NEGATIVE
Specific Gravity, Urine: 1.01 (ref 1.000–1.030)
Total Protein, Urine: NEGATIVE
Urine Glucose: NEGATIVE
Urobilinogen, UA: 0.2 (ref 0.0–1.0)
pH: 7 (ref 5.0–8.0)

## 2021-09-29 LAB — CBC WITH DIFFERENTIAL/PLATELET
Basophils Absolute: 0.1 10*3/uL (ref 0.0–0.1)
Basophils Relative: 0.8 % (ref 0.0–3.0)
Eosinophils Absolute: 0.3 10*3/uL (ref 0.0–0.7)
Eosinophils Relative: 4.6 % (ref 0.0–5.0)
HCT: 45.3 % (ref 39.0–52.0)
Hemoglobin: 15.4 g/dL (ref 13.0–17.0)
Lymphocytes Relative: 22 % (ref 12.0–46.0)
Lymphs Abs: 1.6 10*3/uL (ref 0.7–4.0)
MCHC: 34 g/dL (ref 30.0–36.0)
MCV: 93.6 fl (ref 78.0–100.0)
Monocytes Absolute: 0.6 10*3/uL (ref 0.1–1.0)
Monocytes Relative: 8.2 % (ref 3.0–12.0)
Neutro Abs: 4.6 10*3/uL (ref 1.4–7.7)
Neutrophils Relative %: 64.4 % (ref 43.0–77.0)
Platelets: 254 10*3/uL (ref 150.0–400.0)
RBC: 4.84 Mil/uL (ref 4.22–5.81)
RDW: 13.6 % (ref 11.5–15.5)
WBC: 7.1 10*3/uL (ref 4.0–10.5)

## 2021-09-29 LAB — PSA: PSA: 0.44 ng/mL (ref 0.10–4.00)

## 2021-09-29 LAB — TSH: TSH: 2.44 u[IU]/mL (ref 0.35–5.50)

## 2021-09-29 MED ORDER — FINASTERIDE 5 MG PO TABS: 5.0000 mg | ORAL_TABLET | Freq: Every day | ORAL | 3 refills | Status: DC

## 2021-09-29 NOTE — Progress Notes (Signed)
? ?Subjective:  ?Patient ID: Mario Huerta, male    DOB: [birth year]  Age: 81 y.o. MRN: [id number] ? ?CC: No chief complaint on file. ? ? ?HPI ?Mario Huerta presents for a well exam ? ?Outpatient Medications Prior to Visit  ?Medication Sig Dispense Refill  ? amLODipine (NORVASC) 5 MG tablet TAKE 1 TABLET BY MOUTH DAILY 90 tablet 1  ? aspirin EC 81 MG tablet Take 81 mg by mouth daily.    ? Cholecalciferol (VITAMIN D3) 50 MCG (2000 UT) capsule Take 1 capsule (2,000 Units total) by mouth daily. 100 capsule 3  ? Glucosamine-Chondroitin-MSM (TRIPLE FLEX PO) Take 1 tablet by mouth daily.    ? lisinopril-hydrochlorothiazide (ZESTORETIC) 20-12.5 MG tablet TAKE 1 TABLET BY MOUTH DAILY 90 tablet 2  ? metoprolol succinate (TOPROL-XL) 25 MG 24 hr tablet Take 1 tablet (25 mg total) by mouth daily. Take with or immediately following a meal. 90 tablet 3  ? Multiple Vitamins-Minerals (CENTRUM SILVER 50+MEN) TABS Take 1 tablet by mouth daily with breakfast.    ? Omega-3 Fatty Acids (FISH OIL) 1000 MG CAPS Take 1,000 mg by mouth daily.    ? rosuvastatin (CRESTOR) 10 MG tablet Take 1 tablet (10 mg total) by mouth daily. 90 tablet 3  ? VITAMIN E PO Take 1 capsule by mouth daily.    ? finasteride (PROSCAR) 5 MG tablet TAKE 1 TABLET DAILY. MUST SEE PROVIDER BEFORE ANY MORE REFILLS. (Patient taking differently: Take 5 mg by mouth in the morning.) 90 tablet 3  ? AMBULATORY NON FORMULARY MEDICATION [employer] [place] [employer] capsules ?2 capsules by mouth daily    ? pantoprazole (PROTONIX) 40 MG tablet TAKE 1 TABLET(40 MG) BY MOUTH DAILY 30 tablet 0  ? UNABLE TO FIND Med Name: Neuropaquell    ? ?No facility-administered medications prior to visit.  ? ? ?ROS: ?Review of Systems  ?Constitutional:  Negative for appetite change, fatigue and unexpected weight change.  ?HENT:  Negative for congestion, nosebleeds, sneezing, sore throat and trouble swallowing.   ?Eyes:  Negative for itching and visual disturbance.  ?Respiratory:  Negative for cough.    ?Cardiovascular:  Negative for chest pain, palpitations and leg swelling.  ?Gastrointestinal:  Negative for abdominal distention, blood in stool, diarrhea and nausea.  ?Genitourinary:  Negative for frequency and hematuria.  ?Musculoskeletal:  Positive for arthralgias, back pain and gait problem. Negative for joint swelling and neck pain.  ?Skin:  Negative for rash.  ?Neurological:  Negative for dizziness, tremors, speech difficulty and weakness.  ?Psychiatric/Behavioral:  Negative for agitation, dysphoric mood, sleep disturbance and suicidal ideas. The patient is not nervous/anxious.   ? ?Objective:  ?BP 132/86 (BP Location: Left Arm, Patient Position: Sitting, Cuff Size: Normal)   Pulse 63   Temp 98.7 ?F (37.1 ?C) (Oral)   Ht '5\' 10"'$  (1.778 m)   Wt 215 lb (97.5 kg)   SpO2 94%   BMI 30.85 kg/m?  ? ?BP Readings from Last 3 Encounters:  ?09/29/21 132/86  ?09/19/21 138/86  ?02/10/21 110/80  ? ? ?Wt Readings from Last 3 Encounters:  ?09/29/21 215 lb (97.5 kg)  ?09/19/21 214 lb (97.1 kg)  ?02/10/21 210 lb 3.2 oz (95.3 kg)  ? ? ?Physical Exam ?Constitutional:   ?   General: He is not in acute distress. ?   Appearance: He is well-developed.  ?   Comments: NAD  ?Eyes:  ?   Conjunctiva/sclera: Conjunctivae normal.  ?   Pupils: Pupils are equal, round, and reactive to light.  ?  Neck:  ?   Thyroid: No thyromegaly.  ?   Vascular: No JVD.  ?Cardiovascular:  ?   Rate and Rhythm: Normal rate and regular rhythm.  ?   Heart sounds: Normal heart sounds. No murmur heard. ?  No friction rub. No gallop.  ?Pulmonary:  ?   Effort: Pulmonary effort is normal. No respiratory distress.  ?   Breath sounds: Normal breath sounds. No wheezing or rales.  ?Chest:  ?   Chest wall: No tenderness.  ?Abdominal:  ?   General: Bowel sounds are normal. There is no distension.  ?   Palpations: Abdomen is soft. There is no mass.  ?   Tenderness: There is no abdominal tenderness. There is no guarding or rebound.  ?Musculoskeletal:     ?   General:  Tenderness present. Normal range of motion.  ?   Cervical back: Normal range of motion.  ?Lymphadenopathy:  ?   Cervical: No cervical adenopathy.  ?Skin: ?   General: Skin is warm and dry.  ?   Findings: No rash.  ?Neurological:  ?   Mental Status: He is alert and oriented to person, place, and time.  ?   Cranial Nerves: No cranial nerve deficit.  ?   Motor: No abnormal muscle tone.  ?   Coordination: Coordination normal.  ?   Gait: Gait normal.  ?   Deep Tendon Reflexes: Reflexes are normal and symmetric.  ?Psychiatric:     ?   Behavior: Behavior normal.     ?   Thought Content: Thought content normal.     ?   Judgment: Judgment normal.  ?Rectal - declined ?AKs on face and hands ? ?Lab Results  ?Component Value Date  ? WBC 6.4 01/24/2021  ? HGB 15.7 01/24/2021  ? HCT 44.8 01/24/2021  ? PLT 262 01/24/2021  ? GLUCOSE 93 05/13/2021  ? CHOL 106 05/13/2021  ? TRIG 58 05/13/2021  ? HDL 47 05/13/2021  ? LDLCALC 46 05/13/2021  ? ALT 20 05/13/2021  ? AST 30 05/13/2021  ? NA 141 05/13/2021  ? K 4.0 05/13/2021  ? CL 105 05/13/2021  ? CREATININE 0.98 05/13/2021  ? BUN 13 05/13/2021  ? CO2 21 05/13/2021  ? TSH 2.64 12/02/2020  ? PSA 0.59 12/02/2020  ? ? ?ECHOCARDIOGRAM COMPLETE ? ?Result Date: 02/08/2021 ?   ECHOCARDIOGRAM REPORT   Patient Name:   Mario Huerta Date of Exam: 02/08/2021 Medical Rec #:  [id number]       Height:       70.0 in Accession #:    [id number]      Weight:       207.2 lb Date of Birth:  November 18, 1940       BSA:          2.119 m? Patient Age:    31 years        BP:           153/87 mmHg Patient Gender: M               HR:           61 bpm. Exam Location:  Outpatient Procedure: 2D Echo, Cardiac Doppler, Color Doppler and Intracardiac            Opacification Agent Indications:    Dyspnea  History:        Patient has no prior history of Echocardiogram examinations.  CAD; Signs/Symptoms:Dyspnea.  Sonographer:    Merrie Roof RDCS Referring Phys: [id number] [place]  1. Left  ventricular ejection fraction, by estimation, is 45 to 50%. The left ventricle has mildly decreased function. The left ventricle demonstrates regional wall motion abnormalities (see scoring diagram/findings for description). Left ventricular diastolic parameters are consistent with Grade I diastolic dysfunction (impaired relaxation). Dykinesis and hypokinesis of the mid to apical lateral and anteroseptal walls.  2. Right ventricular systolic function is normal. The right ventricular size is normal. Tricuspid regurgitation signal is inadequate for assessing PA pressure.  3. Left atrial size was mildly dilated.  4. The mitral valve is normal in structure. Mild mitral valve regurgitation. No evidence of mitral stenosis.  5. The aortic valve is normal in structure. Aortic valve regurgitation is trivial. No aortic stenosis is present.  6. Aortic dilatation noted. There is mild dilatation of the aortic root, measuring 41 mm.  7. The inferior vena cava is normal in size with greater than 50% respiratory variability, suggesting right atrial pressure of 3 mmHg. FINDINGS  Left Ventricle: Left ventricular ejection fraction, by estimation, is 45 to 50%. The left ventricle has mildly decreased function. The left ventricle demonstrates regional wall motion abnormalities. Definity contrast agent was given IV to delineate the left ventricular endocardial borders. The left ventricular internal cavity size was normal in size. There is no left ventricular hypertrophy. Left ventricular diastolic parameters are consistent with Grade I diastolic dysfunction (impaired relaxation).  LV Wall Scoring: Dykinesis and hypokinesis of the mid to apical lateral and anteroseptal walls. Right Ventricle: The right ventricular size is normal. No increase in right ventricular wall thickness. Right ventricular systolic function is normal. Tricuspid regurgitation signal is inadequate for assessing PA pressure. Left Atrium: Left atrial size was mildly  dilated. Right Atrium: Right atrial size was normal in size. Pericardium: There is no evidence of pericardial effusion. Mitral Valve: The mitral valve is normal in structure. Mild mitral valve regurgitation. No evidence of

## 2021-09-29 NOTE — Assessment & Plan Note (Addendum)
Blue-Emu cream was recommended to use 2-3 times a day ?TENS unit ?

## 2021-09-29 NOTE — Assessment & Plan Note (Signed)
Blue-Emu cream was recommended to use 2-3 times a day ?Try TENS ? ?

## 2021-09-29 NOTE — Assessment & Plan Note (Addendum)
?  We discussed age appropriate health related issues, including available/recomended screening tests and vaccinations. Labs were ordered to be later reviewed . All questions were answered. We discussed one or more of the following - seat belt use, use of sunscreen/sun exposure exercise, fall risk reduction, second hand smoke exposure, firearm use and storage, seat belt use, a need for adhering to healthy diet and exercise. ?Labs were ordered w/PSA.  All questions were answered. Pt refused all vaccines. ?Cologuard ordered ? ? ?

## 2021-09-29 NOTE — Patient Instructions (Signed)
Blue-Emu cream -- use 2-3 times a day ? ?

## 2021-10-10 DIAGNOSIS — Z1211 Encounter for screening for malignant neoplasm of colon: Secondary | ICD-10-CM | POA: Diagnosis not present

## 2021-10-19 ENCOUNTER — Other Ambulatory Visit: Payer: Self-pay | Admitting: Gastroenterology

## 2021-10-20 ENCOUNTER — Other Ambulatory Visit: Payer: Self-pay | Admitting: Gastroenterology

## 2021-10-20 LAB — COLOGUARD: COLOGUARD: NEGATIVE

## 2021-10-22 ENCOUNTER — Other Ambulatory Visit: Payer: Self-pay | Admitting: Internal Medicine

## 2021-12-06 ENCOUNTER — Telehealth: Payer: Self-pay | Admitting: Internal Medicine

## 2021-12-06 NOTE — Telephone Encounter (Signed)
Left message for patient to call back to schedule Medicare Annual Wellness Visit   Last AWV  11/30/20  Please schedule at anytime with [facility] if patient calls the office back.      Any questions, please call me at [phone]

## 2021-12-26 ENCOUNTER — Ambulatory Visit: Payer: Medicare Other

## 2022-01-03 ENCOUNTER — Ambulatory Visit (INDEPENDENT_AMBULATORY_CARE_PROVIDER_SITE_OTHER): Payer: Medicare Other

## 2022-01-03 DIAGNOSIS — Z0000 Encounter for general adult medical examination without abnormal findings: Secondary | ICD-10-CM | POA: Diagnosis not present

## 2022-01-03 NOTE — Patient Instructions (Signed)
Mario Huerta , Thank you for taking time to come for your Medicare Wellness Visit. I appreciate your ongoing commitment to your health goals. Please review the following plan we discussed and let me know if I can assist you in the future.   Screening recommendations/referrals: Cologard: 10/20/2021 Recommended yearly ophthalmology/optometry visit for glaucoma screening and checkup Recommended yearly dental visit for hygiene and checkup  Vaccinations: Influenza vaccine: declined Pneumococcal vaccine: declined Tdap vaccine: 06/17/2018; due eery 10 years Shingles vaccine: declined   Covid-19: declined  Advanced directives: Yes; Please bring a copy of your health care power of attorney and living will to the office at your convenience.  Conditions/risks identified: Yes  Next appointment: Please schedule your next Medicare Wellness Visit with Nurse Mignon Pine within 1 year by calling [phone].   Preventive Care 82 Years and Older, Male Preventive care refers to lifestyle choices and visits with your health care provider that can promote health and wellness. What does preventive care include? A yearly physical exam. This is also called an annual well check. Dental exams once or twice a year. Routine eye exams. Ask your health care provider how often you should have your eyes checked. Personal lifestyle choices, including: Daily care of your teeth and gums. Regular physical activity. Eating a healthy diet. Avoiding tobacco and drug use. Limiting alcohol use. Practicing safe sex. Taking low doses of aspirin every day. Taking vitamin and mineral supplements as recommended by your health care provider. What happens during an annual well check? The services and screenings done by your health care provider during your annual well check will depend on your age, overall health, lifestyle risk factors, and family history of disease. Counseling  Your health care provider may ask you questions  about your: Alcohol use. Tobacco use. Drug use. Emotional well-being. Home and relationship well-being. Sexual activity. Eating habits. History of falls. Memory and ability to understand (cognition). Work and work Statistician. Screening  You may have the following tests or measurements: Height, weight, and BMI. Blood pressure. Lipid and cholesterol levels. These may be checked every 5 years, or more frequently if you are over 40 years old. Skin check. Lung cancer screening. You may have this screening every year starting at age 14 if you have a 30-pack-year history of smoking and currently smoke or have quit within the past 15 years. Fecal occult blood test (FOBT) of the stool. You may have this test every year starting at age 89. Flexible sigmoidoscopy or colonoscopy. You may have a sigmoidoscopy every 5 years or a colonoscopy every 10 years starting at age 70. Prostate cancer screening. Recommendations will vary depending on your family history and other risks. Hepatitis C blood test. Hepatitis B blood test. Sexually transmitted disease (STD) testing. Diabetes screening. This is done by checking your blood sugar (glucose) after you have not eaten for a while (fasting). You may have this done every 1-3 years. Abdominal aortic aneurysm (AAA) screening. You may need this if you are a current or former smoker. Osteoporosis. You may be screened starting at age 48 if you are at high risk. Talk with your health care provider about your test results, treatment options, and if necessary, the need for more tests. Vaccines  Your health care provider may recommend certain vaccines, such as: Influenza vaccine. This is recommended every year. Tetanus, diphtheria, and acellular pertussis (Tdap, Td) vaccine. You may need a Td booster every 10 years. Zoster vaccine. You may need this after age 32. Pneumococcal 13-valent conjugate (PCV13) vaccine.  One dose is recommended after age 36. Pneumococcal  polysaccharide (PPSV23) vaccine. One dose is recommended after age 35. Talk to your health care provider about which screenings and vaccines you need and how often you need them. This information is not intended to replace advice given to you by your health care provider. Make sure you discuss any questions you have with your health care provider. Document Released: 06/11/2015 Document Revised: 02/02/2016 Document Reviewed: 03/16/2015 Elsevier Interactive Patient Education  2017 [place] Prevention in the Home Falls can cause injuries. They can happen to people of all ages. There are many things you can do to make your home safe and to help prevent falls. What can I do on the outside of my home? Regularly fix the edges of walkways and driveways and fix any cracks. Remove anything that might make you trip as you walk through a door, such as a raised step or threshold. Trim any bushes or trees on the path to your home. Use bright outdoor lighting. Clear any walking paths of anything that might make someone trip, such as rocks or tools. Regularly check to see if handrails are loose or broken. Make sure that both sides of any steps have handrails. Any raised decks and porches should have guardrails on the edges. Have any leaves, snow, or ice cleared regularly. Use sand or salt on walking paths during winter. Clean up any spills in your garage right away. This includes oil or grease spills. What can I do in the bathroom? Use night lights. Install grab bars by the toilet and in the tub and shower. Do not use towel bars as grab bars. Use non-skid mats or decals in the tub or shower. If you need to sit down in the shower, use a plastic, non-slip stool. Keep the floor dry. Clean up any water that spills on the floor as soon as it happens. Remove soap buildup in the tub or shower regularly. Attach bath mats securely with double-sided non-slip rug tape. Do not have throw rugs and other  things on the floor that can make you trip. What can I do in the bedroom? Use night lights. Make sure that you have a light by your bed that is easy to reach. Do not use any sheets or blankets that are too big for your bed. They should not hang down onto the floor. Have a firm chair that has side arms. You can use this for support while you get dressed. Do not have throw rugs and other things on the floor that can make you trip. What can I do in the kitchen? Clean up any spills right away. Avoid walking on wet floors. Keep items that you use a lot in easy-to-reach places. If you need to reach something above you, use a strong step stool that has a grab bar. Keep electrical cords out of the way. Do not use floor polish or wax that makes floors slippery. If you must use wax, use non-skid floor wax. Do not have throw rugs and other things on the floor that can make you trip. What can I do with my stairs? Do not leave any items on the stairs. Make sure that there are handrails on both sides of the stairs and use them. Fix handrails that are broken or loose. Make sure that handrails are as long as the stairways. Check any carpeting to make sure that it is firmly attached to the stairs. Fix any carpet that is loose or  worn. Avoid having throw rugs at the top or bottom of the stairs. If you do have throw rugs, attach them to the floor with carpet tape. Make sure that you have a light switch at the top of the stairs and the bottom of the stairs. If you do not have them, ask someone to add them for you. What else can I do to help prevent falls? Wear shoes that: Do not have high heels. Have rubber bottoms. Are comfortable and fit you well. Are closed at the toe. Do not wear sandals. If you use a stepladder: Make sure that it is fully opened. Do not climb a closed stepladder. Make sure that both sides of the stepladder are locked into place. Ask someone to hold it for you, if possible. Clearly  mark and make sure that you can see: Any grab bars or handrails. First and last steps. Where the edge of each step is. Use tools that help you move around (mobility aids) if they are needed. These include: Canes. Walkers. Scooters. Crutches. Turn on the lights when you go into a dark area. Replace any light bulbs as soon as they burn out. Set up your furniture so you have a clear path. Avoid moving your furniture around. If any of your floors are uneven, fix them. If there are any pets around you, be aware of where they are. Review your medicines with your doctor. Some medicines can make you feel dizzy. This can increase your chance of falling. Ask your doctor what other things that you can do to help prevent falls. This information is not intended to replace advice given to you by your health care provider. Make sure you discuss any questions you have with your health care provider. Document Released: 03/11/2009 Document Revised: 10/21/2015 Document Reviewed: 06/19/2014 Elsevier Interactive Patient Education  2017 [employer].

## 2022-01-03 NOTE — Progress Notes (Cosign Needed Addendum)
I connected with Mario Huerta, Jr. today by telephone and verified that I am speaking with the correct person using two identifiers. Location patient: home Location provider: work Persons participating in the virtual visit: patient, provider.   I discussed the limitations, risks, security and privacy concerns of performing an evaluation and management service by telephone and the availability of in person appointments. I also discussed with the patient that there may be a patient responsible charge related to this service. The patient expressed understanding and verbally consented to this telephonic visit.    Interactive audio and video telecommunications were attempted between this provider and patient, however failed, due to patient having technical difficulties OR patient did not have access to video capability.  We continued and completed visit with audio only.  Some vital signs may be absent or patient reported.   Time Spent with patient on telephone encounter: 30 minutes  Subjective:   Mario Huerta is a 81 y.o. male who presents for Medicare Annual/Subsequent preventive examination.  Review of Systems     Cardiac Risk Factors include: advanced age (>64mn, >>31women);hypertension;male gender;dyslipidemia     Objective:    There were no vitals filed for this visit. There is no height or weight on file to calculate BMI.     01/03/2022    1:53 PM 01/24/2021   11:19 AM 11/30/2020   11:04 AM 10/08/2019    9:07 AM 10/07/2019    9:03 AM 02/12/2018    1:44 PM  Advanced Directives  Does Patient Have a Medical Advance Directive? Yes No Yes Yes Yes No  Type of Advance Directive Living will;Healthcare Power of Attorney  Living [place]   Does patient want to make changes to medical advance directive? No - Patient declined  No - Patient declined  No - Patient declined   Copy of [place]? No -  copy requested  No - copy requested  No - copy requested   Would patient like information on creating a medical advance directive?  No - Patient declined        Current Medications (verified) Outpatient Encounter Medications as of 01/03/2022  Medication Sig   amLODipine (NORVASC) 5 MG tablet TAKE 1 TABLET BY MOUTH DAILY   aspirin EC 81 MG tablet Take 81 mg by mouth daily.   Cholecalciferol (VITAMIN D3) 50 MCG (2000 UT) capsule Take 1 capsule (2,000 Units total) by mouth daily.   finasteride (PROSCAR) 5 MG tablet Take 1 tablet (5 mg total) by mouth daily.   Glucosamine-Chondroitin-MSM (TRIPLE FLEX PO) Take 1 tablet by mouth daily.   lisinopril-hydrochlorothiazide (ZESTORETIC) 20-12.5 MG tablet TAKE 1 TABLET BY MOUTH DAILY   metoprolol succinate (TOPROL-XL) 25 MG 24 hr tablet Take 1 tablet (25 mg total) by mouth daily. Take with or immediately following a meal.   Multiple Vitamins-Minerals (CENTRUM SILVER 50+MEN) TABS Take 1 tablet by mouth daily with breakfast.   Omega-3 Fatty Acids (FISH OIL) 1000 MG CAPS Take 1,000 mg by mouth daily.   pantoprazole (PROTONIX) 40 MG tablet TAKE 1 TABLET(40 MG) BY MOUTH DAILY   rosuvastatin (CRESTOR) 10 MG tablet Take 1 tablet (10 mg total) by mouth daily.   VITAMIN E PO Take 1 capsule by mouth daily.   No facility-administered encounter medications on file as of 01/03/2022.    Allergies (verified) Codeine, Gabapentin, Hydrocodone, Oxycodone, and Tramadol   History: Past Medical History:  Diagnosis Date  Arthritis    Food impaction of esophagus    Gastric ulcer    Hypertension    Past Surgical History:  Procedure Laterality Date   BIOPSY  01/31/2018   Procedure: BIOPSY;  Surgeon: Ronnette Juniper, MD;  Location: [facility] ENDOSCOPY;  Service: Gastroenterology;;   ESOPHAGOGASTRODUODENOSCOPY N/A 01/31/2018   Procedure: ESOPHAGOGASTRODUODENOSCOPY (EGD);  Surgeon: Ronnette Juniper, MD;  Location: [facility];  Service: Gastroenterology;  Laterality: N/A;   FOREIGN BODY  REMOVAL  01/31/2018   Procedure: FOREIGN BODY REMOVAL;  Surgeon: Ronnette Juniper, MD;  Location: [facility] ENDOSCOPY;  Service: Gastroenterology;;   HERNIA REPAIR     MOUTH SURGERY  2019   TOTAL HIP ARTHROPLASTY Right    Family History  Problem Relation Age of Onset   Colon cancer Neg Hx    Esophageal cancer Neg Hx    Pancreatic cancer Neg Hx    Stomach cancer Neg Hx    Liver disease Neg Hx    Social History   Socioeconomic History   Marital status: Widowed    Spouse name: Not on file   Number of children: Not on file   Years of education: Not on file   Highest education level: Not on file  Occupational History   Not on file  Tobacco Use   Smoking status: Never   Smokeless tobacco: Never  Vaping Use   Vaping Use: Never used  Substance and Sexual Activity   Alcohol use: Yes    Comment: occasional   Drug use: Never   Sexual activity: Yes    Partners: Female  Other Topics Concern   Not on file  Social History Narrative   Right handed   One story home   Drinks caffeine coffee qam   Social Determinants of Health   Financial Resource Strain: Low Risk  (01/03/2022)   Overall Financial Resource Strain (CARDIA)    Difficulty of Paying Living Expenses: Not hard at all  Food Insecurity: No Food Insecurity (01/03/2022)   Hunger Vital Sign    Worried About Running Out of Food in the Last Year: Never true    [place] in the Last Year: Never true  Transportation Needs: No Transportation Needs (01/03/2022)   PRAPARE - Hydrologist (Medical): No    Lack of Transportation (Non-Medical): No  Physical Activity: Sufficiently Active (01/03/2022)   Exercise Vital Sign    Days of Exercise per Week: 5 days    Minutes of Exercise per Session: 30 min  Stress: No Stress Concern Present (01/03/2022)   [place]    Feeling of Stress : Not at all  Social Connections: Socially Isolated (01/03/2022)   Social  Connection and Isolation Panel [NHANES]    Frequency of Communication with Friends and Family: More than three times a week    Frequency of Social Gatherings with Friends and Family: More than three times a week    Attends Religious Services: Never    Marine scientist or Organizations: No    Attends Archivist Meetings: Never    Marital Status: Widowed    Tobacco Counseling Counseling given: Not Answered   Clinical Intake:  Pre-visit preparation completed: Yes  Pain : No/denies pain     Nutritional Risks: None Diabetes: No  How often do you need to have someone help you when you read instructions, pamphlets, or other written materials from your doctor or pharmacy?: 1 - Never What is the  last grade level you completed in school?: HSG  Diabetic? no  Interpreter Needed?: No  Information entered by :: Lisette Abu, LPN.   Activities of Daily Living    01/03/2022    1:55 PM  In your present state of health, do you have any difficulty performing the following activities:  Hearing? 0  Vision? 0  Difficulty concentrating or making decisions? 0  Walking or climbing stairs? 0  Dressing or bathing? 0  Doing errands, shopping? 0  Preparing Food and eating ? N  Using the Toilet? N  In the past six months, have you accidently leaked urine? N  Do you have problems with loss of bowel control? N  Managing your Medications? N  Managing your Finances? N  Housekeeping or managing your Housekeeping? N    Patient Care Team: Plotnikov, Evie Lacks, MD as PCP - General (Internal Medicine) Elouise Munroe, MD as PCP - Cardiology (Cardiology) Pieter Partridge, DO as Consulting Physician (Neurology)  Indicate any recent Medical Services you may have received from other than [facility] providers in the past year (date may be approximate).     Assessment:   This is a routine wellness examination for Mario Huerta.  Hearing/Vision screen Hearing Screening - Comments:: No  hearing difficulty. No hearing aids. Vision Screening - Comments:: Patient does not wear any corrective lenses. Cataracts removed and lens implanted.  Dietary issues and exercise activities discussed: Current Exercise Habits: Home exercise routine, Type of exercise: walking, Time (Minutes): 30, Frequency (Times/Week): 5, Weekly Exercise (Minutes/Week): 150, Intensity: Moderate, Exercise limited by: orthopedic condition(s)   Goals Addressed             This Visit's Progress    My goal is to lose weight.  My ideal weight is to be 200 lbs.        Depression Screen    01/03/2022    1:41 PM 11/30/2020   11:03 AM 10/08/2019    9:07 AM 10/07/2019    9:04 AM  PHQ 2/9 Scores  PHQ - 2 Score 0 0 0 0    Fall Risk    01/03/2022    1:39 PM 11/30/2020   11:05 AM 10/08/2019    9:07 AM 10/07/2019    9:04 AM  [place] in the past year? 0 0 0 0  Number falls in past yr: 0 0 0 0  Injury with Fall? 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 No  If so, are there any without handrails? No  Home free of loose throw rugs in walkways, pet beds, electrical cords, etc? Yes  Adequate lighting in your home to reduce risk of falls? Yes   ASSISTIVE DEVICES UTILIZED TO PREVENT FALLS:  Life alert? No  Use of a cane, walker or w/c? No  Grab bars in the bathroom? No  Shower chair or bench in shower? No  Elevated toilet seat or a handicapped toilet? No   TIMED UP AND GO:  Was the test performed? No .  Length of time to ambulate 10 feet: n/a sec.   Appearance of gait: Gait not evaluated during this visit.  Cognitive Function:        01/03/2022    1:56 PM 10/07/2019    9:05 AM  6CIT  Screen  What Year? 0 points 0 points  What month? 0 points 0 points  What time? 0 points 0 points  Count back from 20 0 points 0 points  Months in reverse 0 points 0 points  Repeat phrase 0 points 0 points  Total Score 0 points 0 points    Immunizations Immunization History  Administered Date(s) Administered   Tdap 06/17/2018    TDAP status: Up to date  Flu Vaccine status: Declined, Education has been provided regarding the importance of this vaccine but patient still declined. Advised may receive this vaccine at local pharmacy or Health Dept. Aware to provide a copy of the vaccination record if obtained from local pharmacy or Health Dept. Verbalized acceptance and understanding.  Pneumococcal vaccine status: Declined,  Education has been provided regarding the importance of this vaccine but patient still declined. Advised may receive this vaccine at local pharmacy or Health Dept. Aware to provide a copy of the vaccination record if obtained from local pharmacy or Health Dept. Verbalized acceptance and understanding.   Covid-19 vaccine status: Declined, Education has been provided regarding the importance of this vaccine but patient still declined. Advised may receive this vaccine at local pharmacy or Health Dept.or vaccine clinic. Aware to provide a copy of the vaccination record if obtained from local pharmacy or Health Dept. Verbalized acceptance and understanding.  Qualifies for Shingles Vaccine? Yes   Zostavax completed No   Shingrix Completed?: No.    Education has been provided regarding the importance of this vaccine. Patient has been advised to call insurance company to determine out of pocket expense if they have not yet received this vaccine. Advised may also receive vaccine at local pharmacy or Health Dept. Verbalized acceptance and understanding.  Screening Tests Health Maintenance  Topic Date Due   Zoster Vaccines- Shingrix (1 of 2) Never done   Pneumonia Vaccine 57+  Years old (1 - PCV) Never done   INFLUENZA VACCINE  12/27/2021   TETANUS/TDAP  06/17/2028   HPV VACCINES  Aged Out   COVID-19 Vaccine  Discontinued    Health Maintenance  Health Maintenance Due  Topic Date Due   Zoster Vaccines- Shingrix (1 of 2) Never done   Pneumonia Vaccine 67+ Years old (1 - PCV) Never done   INFLUENZA VACCINE  12/27/2021    Colorectal cancer screening: Type of screening: Cologuard. Completed 10/20/2021. Repeat every 3 years  Lung Cancer Screening: (Low Dose CT Chest recommended if Age 41-80 years, 30 pack-year currently smoking OR have quit w/in 15years.) does not qualify.   Lung Cancer Screening Referral: no  Additional Screening:  Hepatitis C Screening: does not qualify; Completed no  Vision Screening: Recommended annual ophthalmology exams for early detection of glaucoma and other disorders of the eye. Is the patient up to date with their annual eye exam?  Yes  Who is the provider or what is the name of the office in which the patient attends annual eye exams? [facility] If pt is not established with a provider, would they like to be referred to a provider to establish care? No .   Dental Screening: Recommended annual dental exams for proper oral hygiene  Community Resource Referral / Chronic Care Management: CRR required this visit?  No   CCM required this visit?  No      Plan:  I have personally reviewed and noted the following in the patient's chart:   Medical and social history Use of alcohol, tobacco or illicit drugs  Current medications and supplements including opioid prescriptions. Patient is not currently taking opioid prescriptions. Functional ability and status Nutritional status Physical activity Advanced directives List of other physicians Hospitalizations, surgeries, and ER visits in previous 12 months Vitals Screenings to include cognitive, depression, and falls Referrals and appointments  In  addition, I have reviewed and discussed with patient certain preventive protocols, quality metrics, and best practice recommendations. A written personalized care plan for preventive services as well as general preventive health recommendations were provided to patient.     Sheral Flow, LPN   07/06/3660   Nurse Notes:  There were no vitals filed for this visit. There is no height or weight to calculate BMI. Medications reviewed with patient; no opioid use noted.  Medical screening examination/treatment/procedure(s) were performed by non-physician practitioner and as supervising physician I was immediately available for consultation/collaboration.  I agree with above. Lew Dawes, MD

## 2022-01-05 ENCOUNTER — Other Ambulatory Visit: Payer: Self-pay | Admitting: Internal Medicine

## 2022-01-17 ENCOUNTER — Other Ambulatory Visit: Payer: Self-pay | Admitting: Internal Medicine

## 2022-02-12 ENCOUNTER — Other Ambulatory Visit: Payer: Self-pay | Admitting: Internal Medicine

## 2022-02-15 ENCOUNTER — Other Ambulatory Visit: Payer: Self-pay | Admitting: Internal Medicine

## 2022-02-16 ENCOUNTER — Other Ambulatory Visit: Payer: Self-pay | Admitting: Internal Medicine

## 2022-02-17 ENCOUNTER — Other Ambulatory Visit: Payer: Self-pay | Admitting: Internal Medicine

## 2022-02-18 ENCOUNTER — Other Ambulatory Visit: Payer: Self-pay | Admitting: Internal Medicine

## 2022-02-19 ENCOUNTER — Encounter: Payer: Self-pay | Admitting: Internal Medicine

## 2022-02-19 ENCOUNTER — Other Ambulatory Visit: Payer: Self-pay | Admitting: Internal Medicine

## 2022-02-20 ENCOUNTER — Other Ambulatory Visit: Payer: Self-pay | Admitting: Internal Medicine

## 2022-02-20 MED ORDER — AMLODIPINE BESYLATE 5 MG PO TABS: 5.0000 mg | ORAL_TABLET | Freq: Every day | ORAL | 0 refills | Status: DC

## 2022-02-23 ENCOUNTER — Other Ambulatory Visit: Payer: Self-pay | Admitting: Internal Medicine

## 2022-02-24 ENCOUNTER — Other Ambulatory Visit: Payer: Self-pay | Admitting: Internal Medicine

## 2022-02-24 MED ORDER — ROSUVASTATIN CALCIUM 10 MG PO TABS: 10.0000 mg | ORAL_TABLET | Freq: Every day | ORAL | 3 refills | Status: DC

## 2022-02-24 MED ORDER — METOPROLOL SUCCINATE ER 25 MG PO TB24: 25.0000 mg | ORAL_TABLET | Freq: Every day | ORAL | 3 refills | Status: DC

## 2022-02-24 NOTE — Addendum Note (Signed)
Addended by: Raiford Simmonds on: 02/24/2022 11:42 AM   Modules accepted: Orders

## 2022-03-14 ENCOUNTER — Other Ambulatory Visit: Payer: Self-pay | Admitting: Internal Medicine

## 2022-04-03 ENCOUNTER — Other Ambulatory Visit: Payer: Self-pay | Admitting: Internal Medicine

## 2022-09-14 IMAGING — DX DG CHEST 2V
2 series · 2 of 2 positions shown · non-contrast
Comparison: None.

CLINICAL DATA: Shortness of breath and chest tightness.

EXAM:
CHEST - 2 VIEW

[w chest pa]
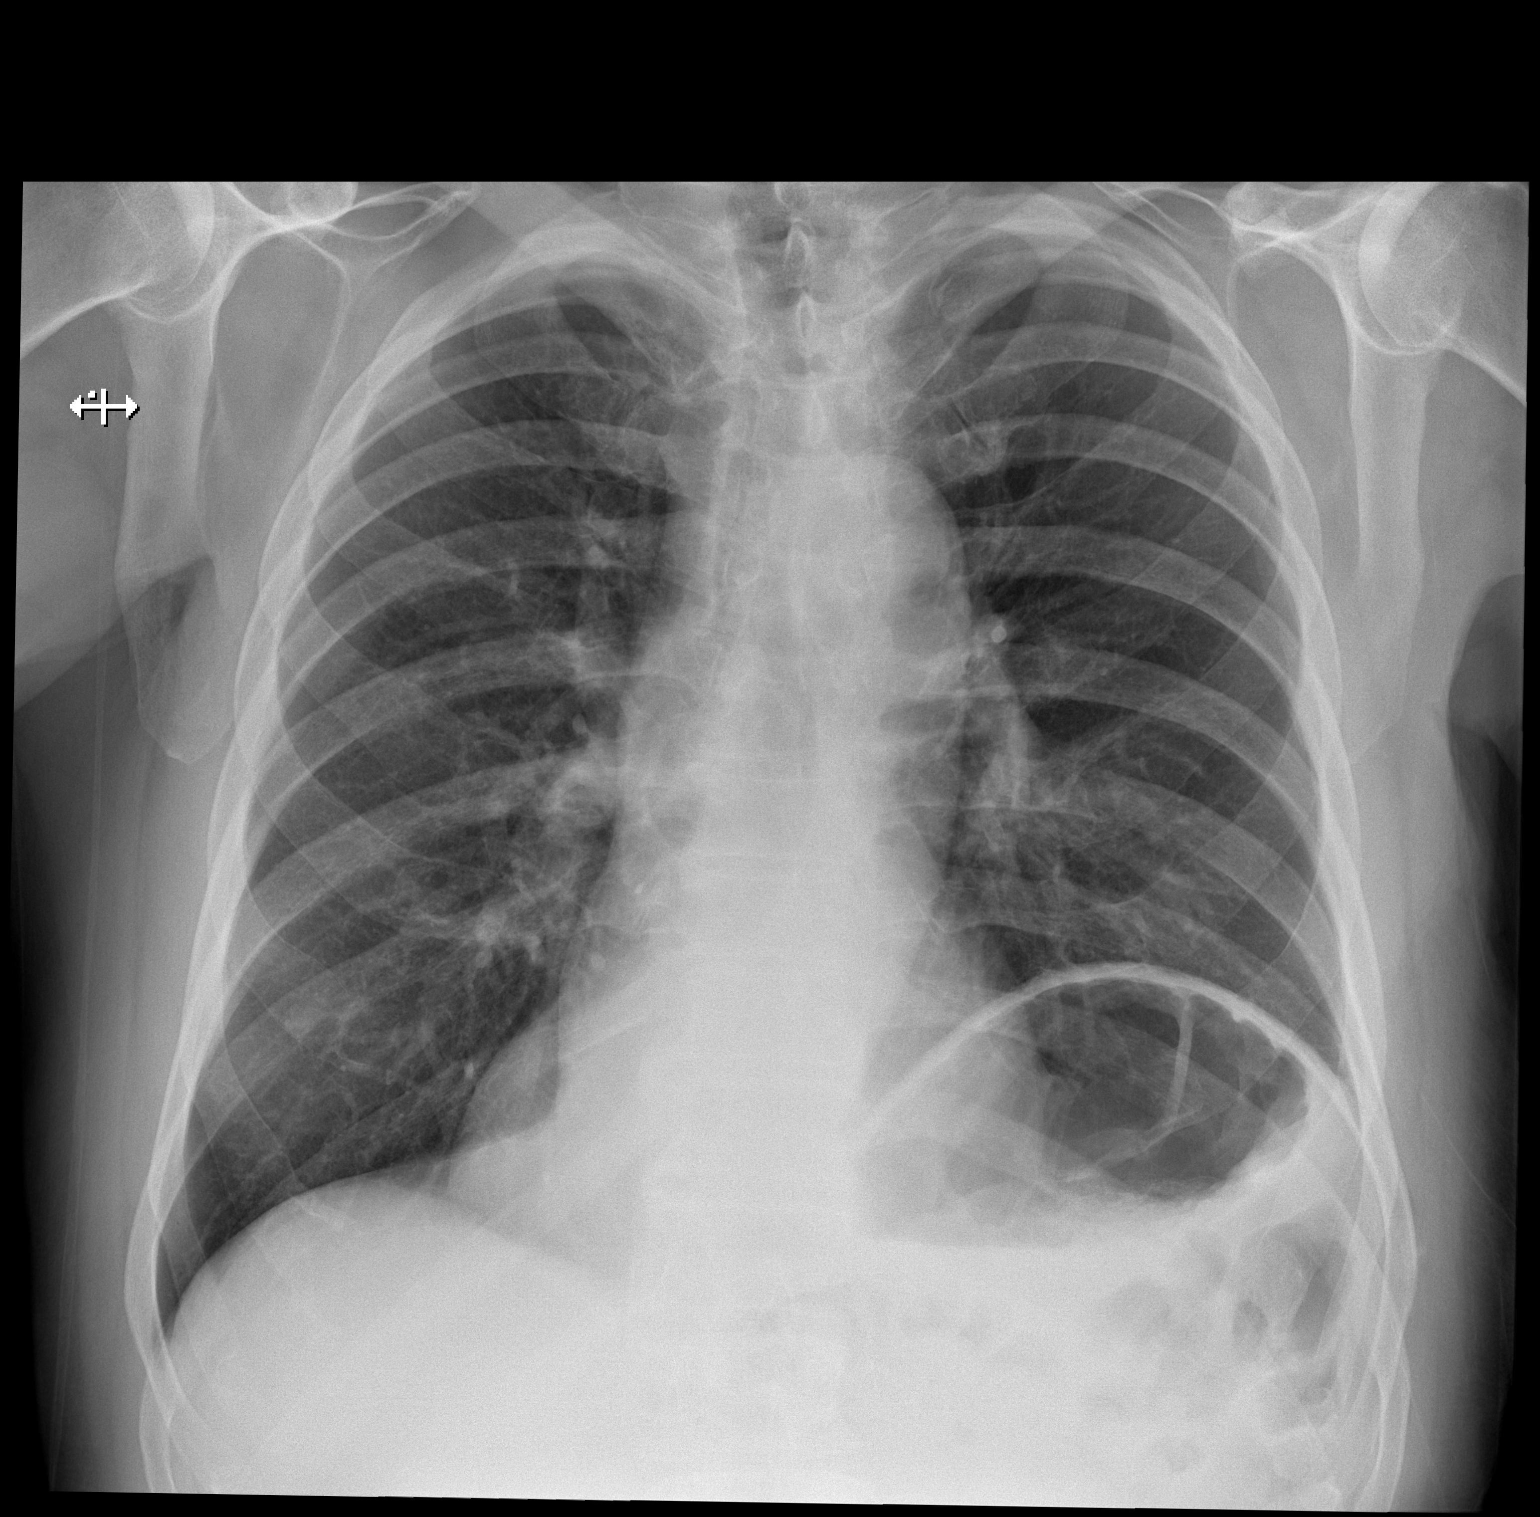

[w chest lat]
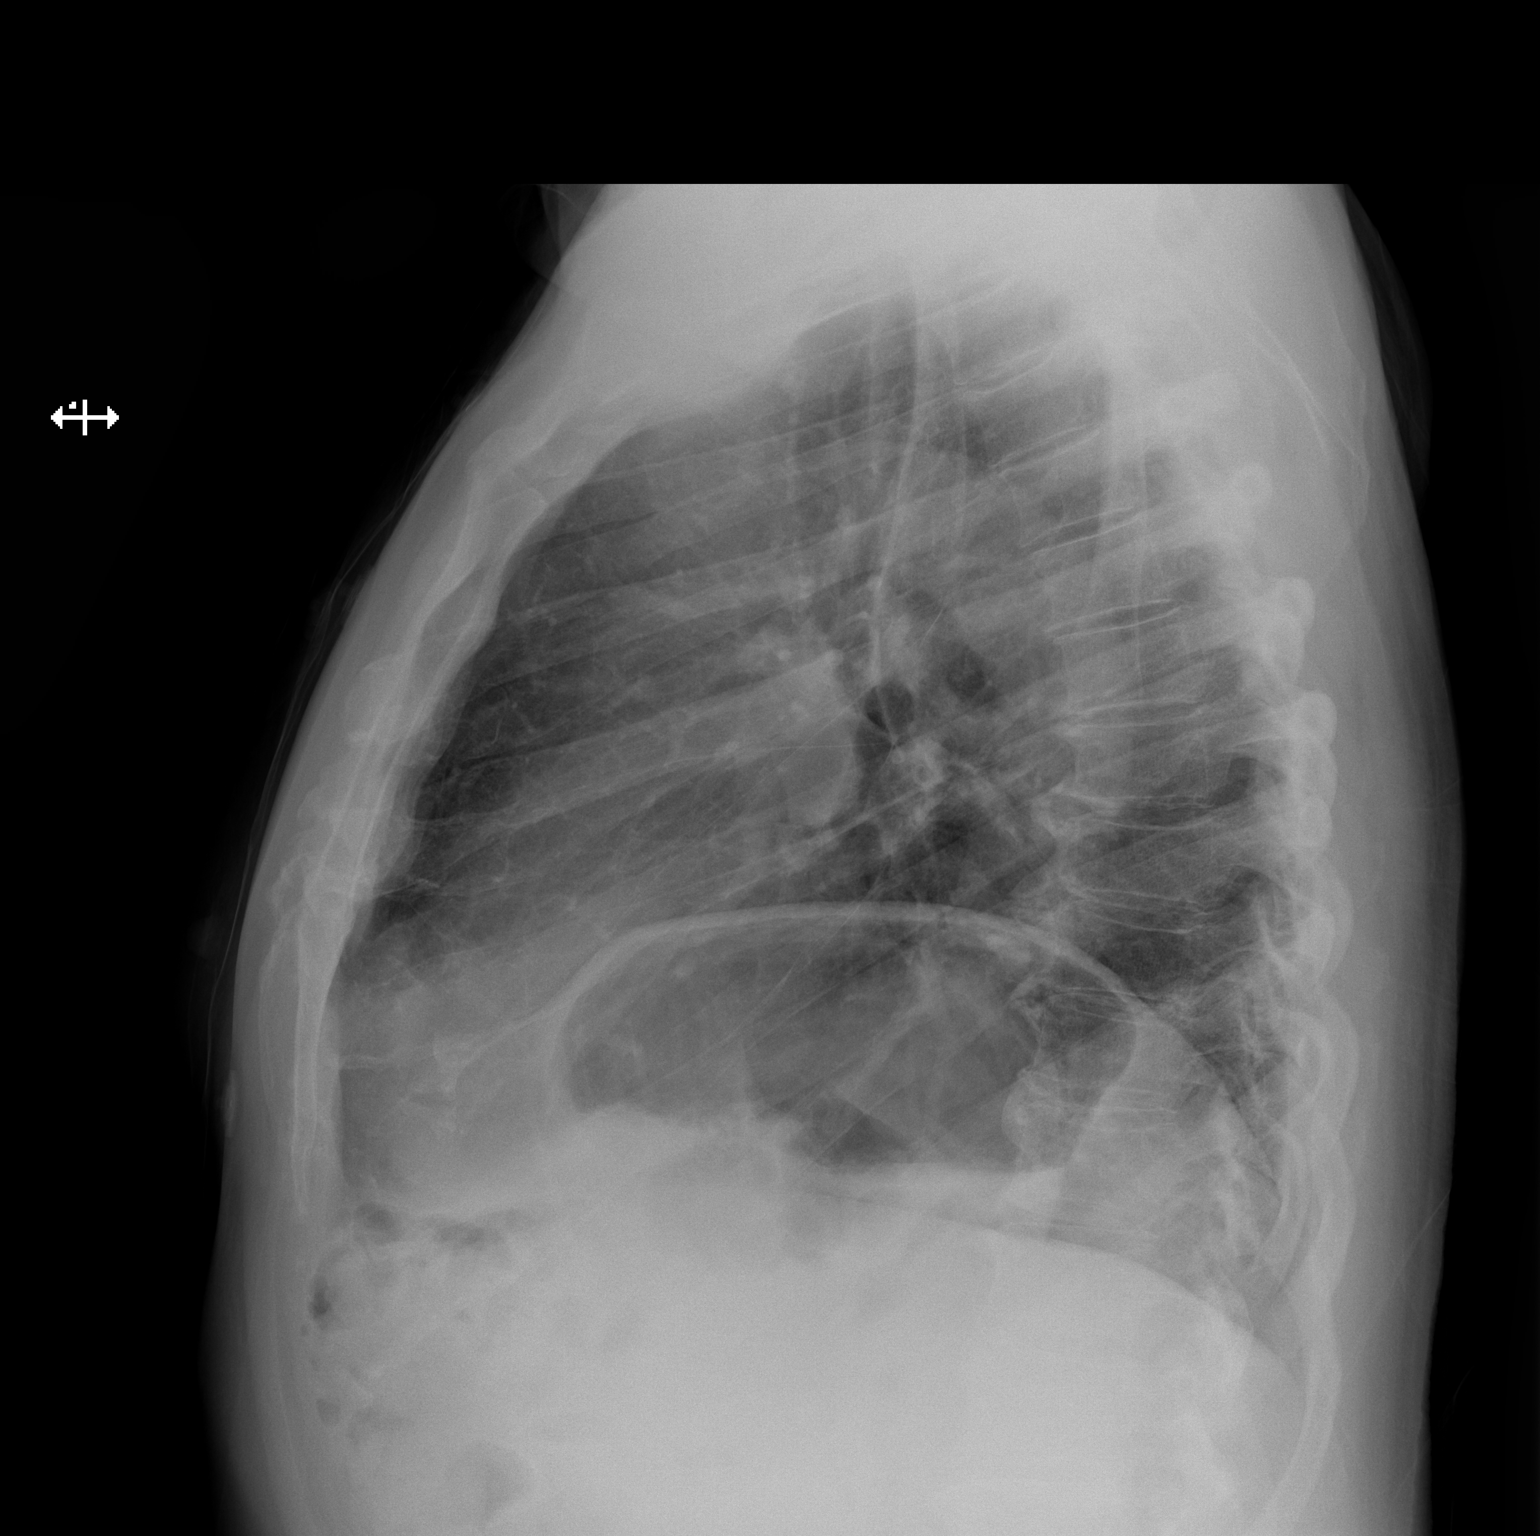

[2 of 2 positions shown; findings below may reference images not displayed]

FINDINGS: The lungs are clear without focal pneumonia, edema, pneumothorax or
pleural effusion. Mild asymmetric elevation left hemidiaphragm. The
cardiopericardial silhouette is within normal limits for size. The
visualized bony structures of the thorax show no acute abnormality.
Stable tiny nodular density at the right base, likely a nipple
shadow.
IMPRESSION: No active cardiopulmonary disease.

## 2022-09-14 IMAGING — CT CT ANGIO CHEST
3 of 7 series · 19 of 36 positions shown · IV contrast (omnipaque)
Comparison: None.

CLINICAL DATA: Shortness of breath, positive D-dimer level.

EXAM:
CT ANGIOGRAPHY CHEST WITH CONTRAST
TECHNIQUE: Multidetector CT imaging of the chest was performed using the
standard protocol during bolus administration of intravenous
contrast. Multiplanar CT image reconstructions and MIPs were
obtained to evaluate the vascular anatomy.
CONTRAST:  79mL OMNIPAQUE IOHEXOL 350 MG/ML SOLN

[Series 7: pe thins · axial · 0.88mm/px · z∈[+1161,+1449]mm · 15 of 471 slices shown]
[im 30/471  lung]
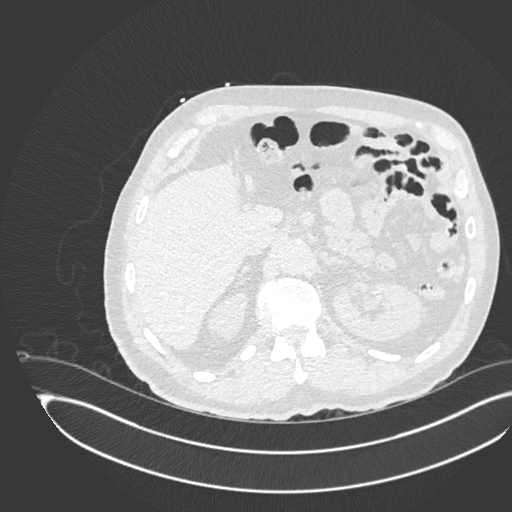
[im 59/471  mediastinal]
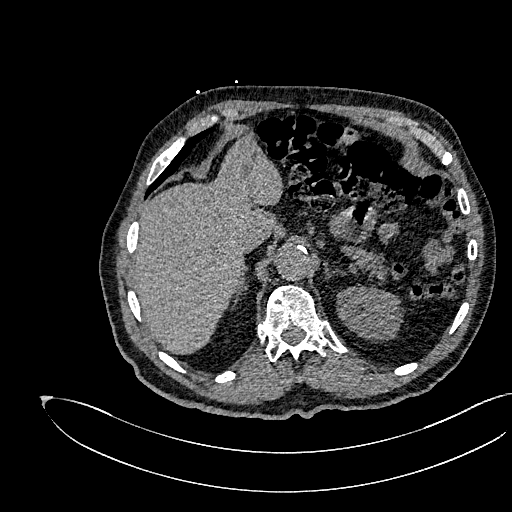
[im 89/471  lung]
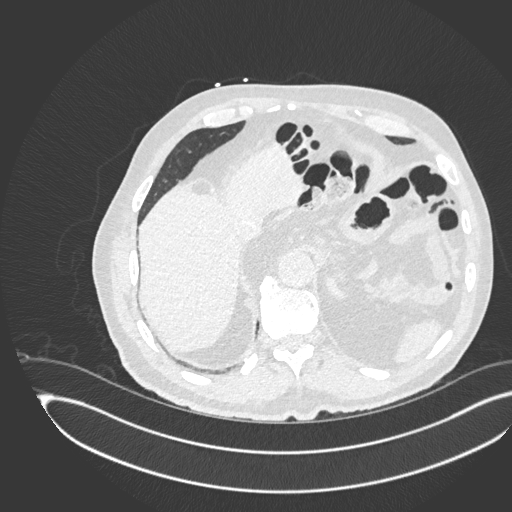
[im 118/471  mediastinal]
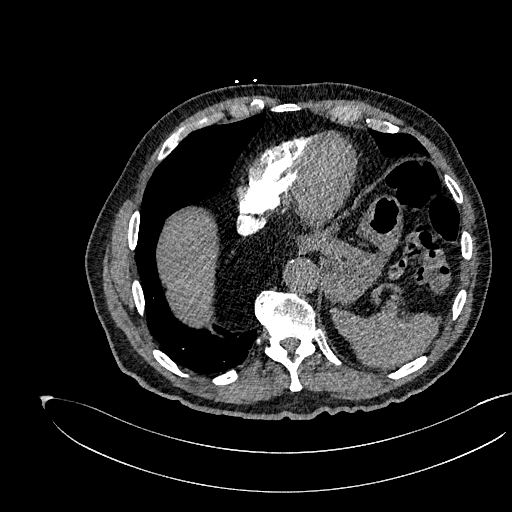
[im 147/471  lung]
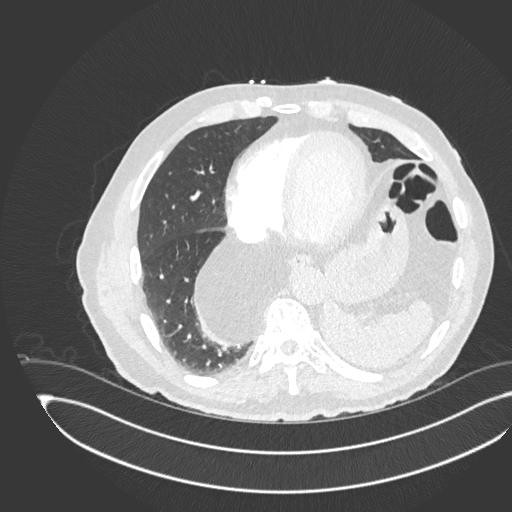
[im 177/471  mediastinal]
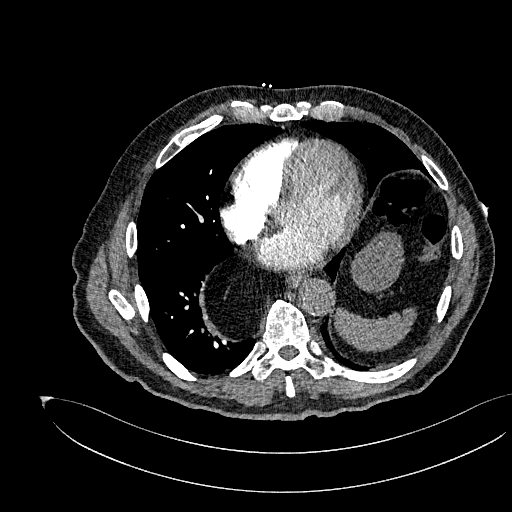
[im 206/471  lung]
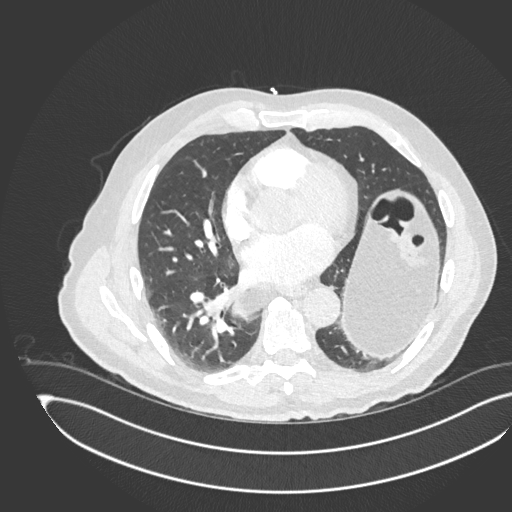
[im 236/471  mediastinal]
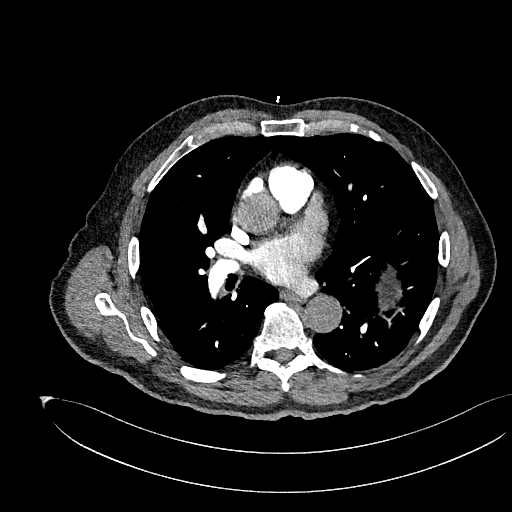
[im 265/471  lung]
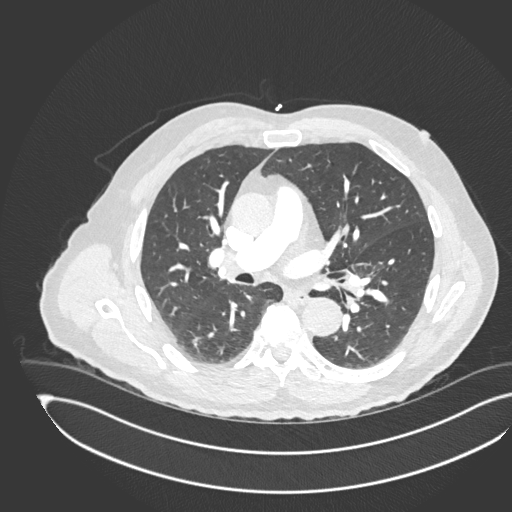
[im 294/471  mediastinal]
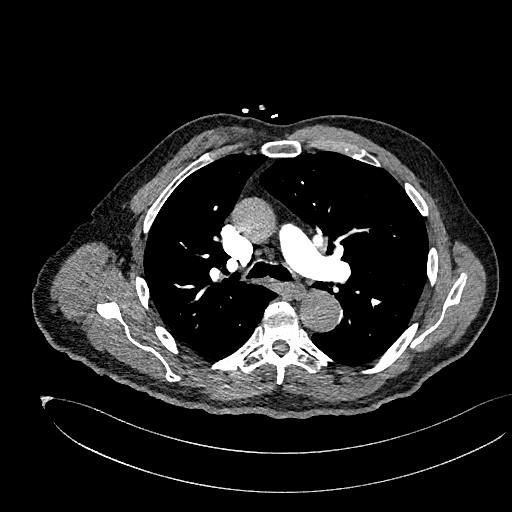
[im 324/471  lung]
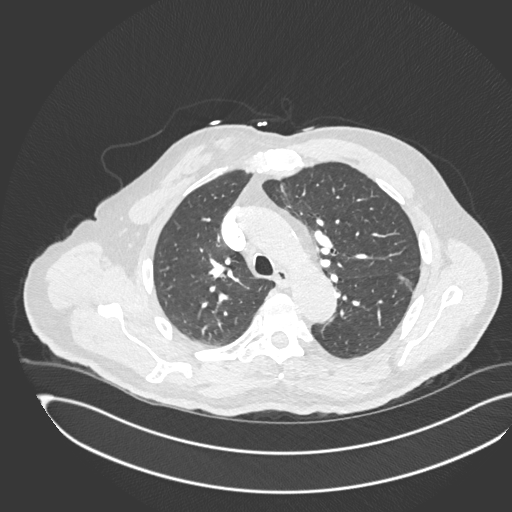
[im 353/471  mediastinal]
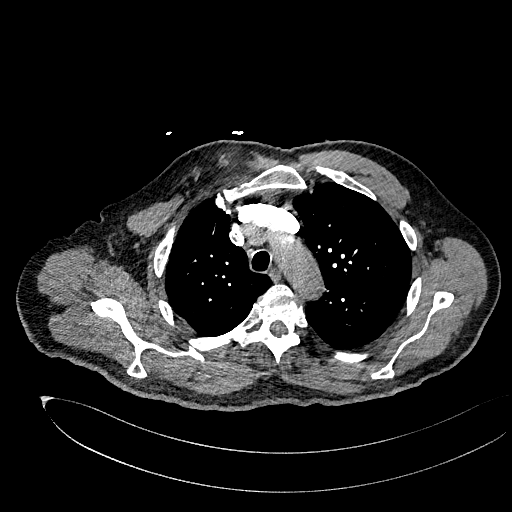
[im 382/471  lung]
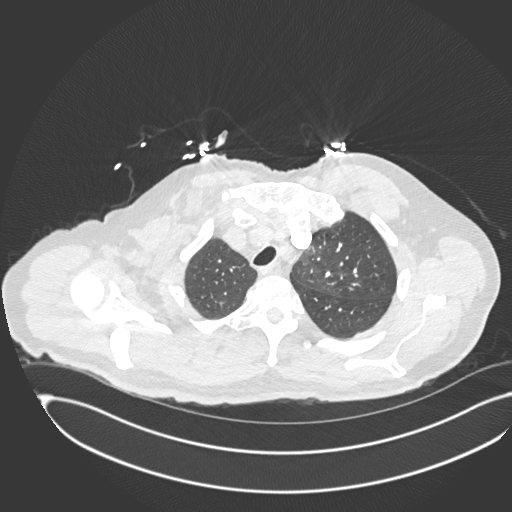
[im 412/471  mediastinal]
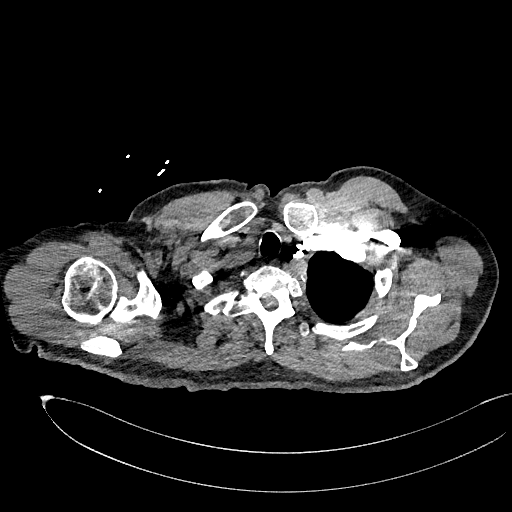
[im 441/471  lung]
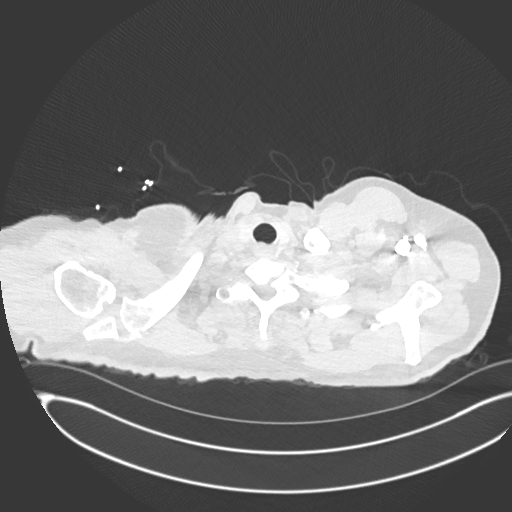

[Series 8: pe lung · axial · 0.88mm/px · z∈[+1220,+1344]mm · 3 of 157 slices shown]
[im 32/157  mediastinal]
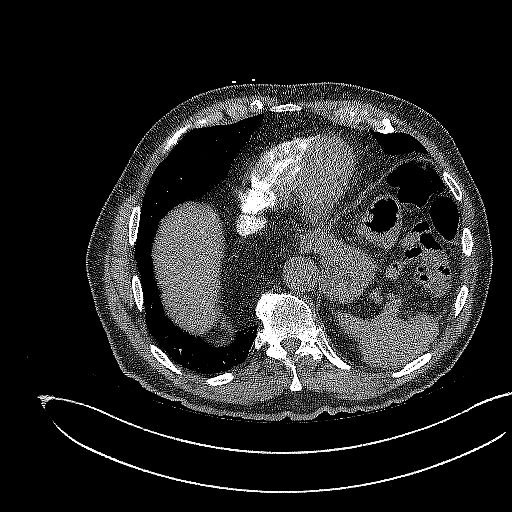
[im 63/157  mediastinal]
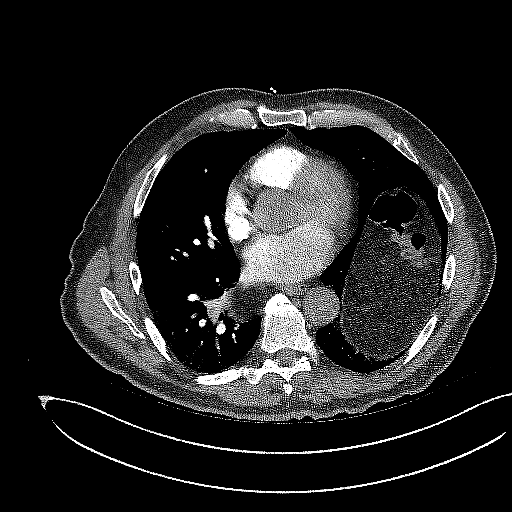
[im 94/157  mediastinal]
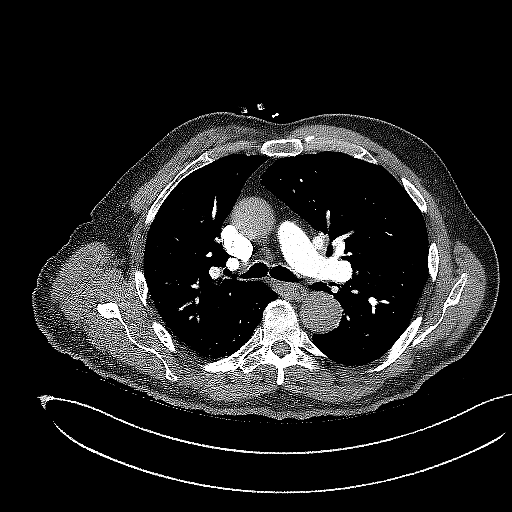

[Series 9: pe 2mm cor · coronal · 0.68mm/px · 1 of 145 slices shown]
[im 73/145  mediastinal]
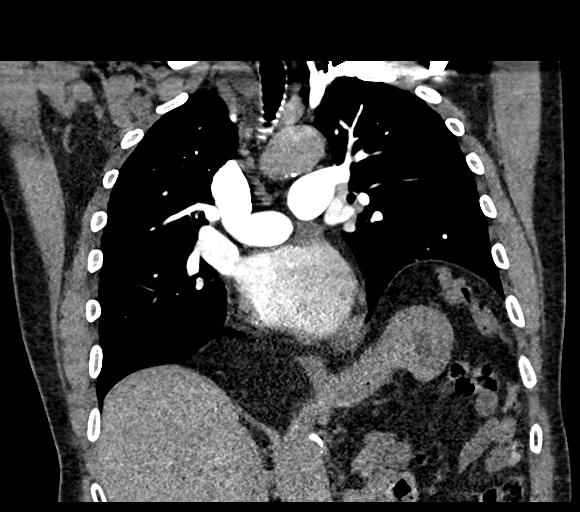

[19 of 36 positions shown; findings below may reference images not displayed]

FINDINGS: Cardiovascular: Satisfactory opacification of the pulmonary arteries
to the segmental level. No evidence of pulmonary embolism. Normal
heart size. No pericardial effusion. Atherosclerosis of thoracic
aorta is noted without aneurysm formation.

Mediastinum/Nodes: No enlarged mediastinal, hilar, or axillary lymph
nodes. Thyroid gland, trachea, and esophagus demonstrate no
significant findings.

Lungs/Pleura: Lungs are clear. No pleural effusion or pneumothorax.

Upper Abdomen: No acute abnormality.

Musculoskeletal: No chest wall abnormality. No acute or significant
osseous findings.

Review of the MIP images confirms the above findings.
IMPRESSION: No definite evidence of pulmonary embolus.

No definite acute abnormality seen in the chest.

Aortic Atherosclerosis ([id number]).

## 2022-10-01 ENCOUNTER — Other Ambulatory Visit: Payer: Self-pay | Admitting: Internal Medicine

## 2022-11-08 ENCOUNTER — Ambulatory Visit (INDEPENDENT_AMBULATORY_CARE_PROVIDER_SITE_OTHER): Payer: Medicare Other | Admitting: Internal Medicine

## 2022-11-08 ENCOUNTER — Encounter: Payer: Self-pay | Admitting: Internal Medicine

## 2022-11-08 VITALS — BP 138/88 | HR 66 | Temp 98.4°F | Ht 70.0 in | Wt 207.2 lb

## 2022-11-08 DIAGNOSIS — I10 Essential (primary) hypertension: Secondary | ICD-10-CM

## 2022-11-08 DIAGNOSIS — I2510 Atherosclerotic heart disease of native coronary artery without angina pectoris: Secondary | ICD-10-CM | POA: Diagnosis not present

## 2022-11-08 DIAGNOSIS — K209 Esophagitis, unspecified without bleeding: Secondary | ICD-10-CM

## 2022-11-08 DIAGNOSIS — E785 Hyperlipidemia, unspecified: Secondary | ICD-10-CM | POA: Diagnosis not present

## 2022-11-08 DIAGNOSIS — K2270 Barrett's esophagus without dysplasia: Secondary | ICD-10-CM | POA: Diagnosis not present

## 2022-11-08 DIAGNOSIS — I739 Peripheral vascular disease, unspecified: Secondary | ICD-10-CM

## 2022-11-08 DIAGNOSIS — R0609 Other forms of dyspnea: Secondary | ICD-10-CM

## 2022-11-08 DIAGNOSIS — I2583 Coronary atherosclerosis due to lipid rich plaque: Secondary | ICD-10-CM

## 2022-11-08 DIAGNOSIS — M545 Low back pain, unspecified: Secondary | ICD-10-CM | POA: Diagnosis not present

## 2022-11-08 DIAGNOSIS — N320 Bladder-neck obstruction: Secondary | ICD-10-CM | POA: Diagnosis not present

## 2022-11-08 LAB — COMPREHENSIVE METABOLIC PANEL
ALT: 28 U/L (ref 0–53)
AST: 41 U/L — ABNORMAL HIGH (ref 0–37)
Albumin: 3.9 g/dL (ref 3.5–5.2)
Alkaline Phosphatase: 90 U/L (ref 39–117)
BUN: 17 mg/dL (ref 6–23)
CO2: 24 mEq/L (ref 19–32)
Calcium: 9 mg/dL (ref 8.4–10.5)
Chloride: 106 mEq/L (ref 96–112)
Creatinine, Ser: 0.91 mg/dL (ref 0.40–1.50)
GFR: 78.14 mL/min (ref >60.00)
Glucose, Bld: 86 mg/dL (ref 70–99)
Potassium: 4.1 mEq/L (ref 3.5–5.1)
Sodium: 139 mEq/L (ref 135–145)
Total Bilirubin: 0.7 mg/dL (ref 0.2–1.2)
Total Protein: 6.5 g/dL (ref 6.0–8.3)

## 2022-11-08 LAB — URINALYSIS
Bilirubin Urine: NEGATIVE
Hgb urine dipstick: NEGATIVE
Ketones, ur: NEGATIVE
Leukocytes,Ua: NEGATIVE
Nitrite: NEGATIVE
Specific Gravity, Urine: 1.02 (ref 1.000–1.030)
Total Protein, Urine: NEGATIVE
Urine Glucose: NEGATIVE
Urobilinogen, UA: 1 (ref 0.0–1.0)
pH: 6.5 (ref 5.0–8.0)

## 2022-11-08 LAB — LIPID PANEL
Cholesterol: 93 mg/dL (ref 0–200)
HDL: 45.5 mg/dL (ref >39.00)
LDL Cholesterol: 24 mg/dL (ref 0–99)
NonHDL: 47.69
Total CHOL/HDL Ratio: 2
Triglycerides: 119 mg/dL (ref 0.0–149.0)
VLDL: 23.8 mg/dL (ref 0.0–40.0)

## 2022-11-08 LAB — CBC WITH DIFFERENTIAL/PLATELET
Basophils Absolute: 0.1 10*3/uL (ref 0.0–0.1)
Basophils Relative: 1.2 % (ref 0.0–3.0)
Eosinophils Absolute: 0.3 10*3/uL (ref 0.0–0.7)
Eosinophils Relative: 5.8 % — ABNORMAL HIGH (ref 0.0–5.0)
HCT: 45.6 % (ref 39.0–52.0)
Hemoglobin: 15.7 g/dL (ref 13.0–17.0)
Lymphocytes Relative: 21.8 % (ref 12.0–46.0)
Lymphs Abs: 1.2 10*3/uL (ref 0.7–4.0)
MCHC: 34.4 g/dL (ref 30.0–36.0)
MCV: 93.2 fl (ref 78.0–100.0)
Monocytes Absolute: 0.6 10*3/uL (ref 0.1–1.0)
Monocytes Relative: 10.3 % (ref 3.0–12.0)
Neutro Abs: 3.3 10*3/uL (ref 1.4–7.7)
Neutrophils Relative %: 60.9 % (ref 43.0–77.0)
Platelets: 248 10*3/uL (ref 150.0–400.0)
RBC: 4.9 Mil/uL (ref 4.22–5.81)
RDW: 13.4 % (ref 11.5–15.5)
WBC: 5.4 10*3/uL (ref 4.0–10.5)

## 2022-11-08 LAB — PSA: PSA: 1.44 ng/mL (ref 0.10–4.00)

## 2022-11-08 LAB — TSH: TSH: 2.73 u[IU]/mL (ref 0.35–5.50)

## 2022-11-08 MED ORDER — MELOXICAM 7.5 MG PO TABS: 7.5000 mg | ORAL_TABLET | Freq: Every day | ORAL | 5 refills | Status: DC | PRN

## 2022-11-08 NOTE — Assessment & Plan Note (Signed)
Pt lost wt Mario Huerta stopped his BP meds 2 wks ago Ok to d/c Metoprolol Cont w/other BP meds

## 2022-11-08 NOTE — Assessment & Plan Note (Signed)
Better after wt loss on diet

## 2022-11-08 NOTE — Assessment & Plan Note (Addendum)
D/c Aleve Meloxicam 7.5 - 15 mg/d pc Tylenol prn On PPI  Potential benefits of a long term  NSAID use as well as potential risks  and complications were explained to the patient and were aknowledged.

## 2022-11-08 NOTE — Assessment & Plan Note (Signed)
Dr Annamaria Helling On Crestor CT angio IMPRESSION: 1. CT FFR analysis showed hemodynamically significant stenosis in the mid LAD or the proximal ramus intermedius.   2. 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 The vessel is small caliber in this region.

## 2022-11-08 NOTE — Assessment & Plan Note (Signed)
Cont on Protonix ?

## 2022-11-08 NOTE — Progress Notes (Signed)
Subjective:  Patient ID: Mario Huerta, male    DOB: 25-Feb-1941  Age: 82 y.o. MRN: [id number]  CC: Annual Exam   HPI [name] presents for GERD, HTN, CAD f/u Pt stopped taking BP meds 2 wks ago Taking Aleve 2/d Outpatient Medications Prior to Visit  Medication Sig Dispense Refill   amLODipine (NORVASC) 5 MG tablet TAKE 1 TABLET BY MOUTH DAILY 90 tablet 1   APPLE CIDER VINEGAR PO Take by mouth.     aspirin EC 81 MG tablet Take 81 mg by mouth daily.     Cholecalciferol (VITAMIN D3) 50 MCG (2000 UT) capsule Take 1 capsule (2,000 Units total) by mouth daily. 100 capsule 3   finasteride (PROSCAR) 5 MG tablet Take 1 tablet (5 mg total) by mouth daily. 90 tablet 3   Glucosamine HCl (GLUCOSAMINE PO) Take by mouth.     Glucosamine-Chondroitin-MSM (TRIPLE FLEX PO) Take 1 tablet by mouth daily.     lisinopril-hydrochlorothiazide (ZESTORETIC) 20-12.5 MG tablet TAKE 1 TABLET BY MOUTH EVERY DAY 90 tablet 1   metoprolol succinate (TOPROL-XL) 25 MG 24 hr tablet Take 1 tablet (25 mg total) by mouth daily. Take with or immediately following a meal. 90 tablet 3   Multiple Vitamins-Minerals (CENTRUM SILVER 50+MEN) TABS Take 1 tablet by mouth daily with breakfast.     Omega-3 Fatty Acids (FISH OIL) 1000 MG CAPS Take 1,000 mg by mouth daily.     pantoprazole (PROTONIX) 40 MG tablet TAKE 1 TABLET(40 MG) BY MOUTH DAILY 30 tablet 0   rosuvastatin (CRESTOR) 10 MG tablet Take 1 tablet (10 mg total) by mouth daily. 90 tablet 3   VITAMIN E PO Take 1 capsule by mouth daily.     No facility-administered medications prior to visit.    ROS: Review of Systems  Constitutional:  Negative for appetite change, fatigue and unexpected weight change.  HENT:  Negative for congestion, nosebleeds, sneezing, sore throat and trouble swallowing.   Eyes:  Negative for itching and visual disturbance.  Respiratory:  Negative for cough.   Cardiovascular:  Negative for chest pain, palpitations and leg swelling.   Gastrointestinal:  Negative for abdominal distention, blood in stool, diarrhea and nausea.  Genitourinary:  Negative for frequency and hematuria.  Musculoskeletal:  Positive for arthralgias, back pain and gait problem. Negative for joint swelling and neck pain.  Skin:  Negative for rash and wound.  Neurological:  Negative for dizziness, tremors, speech difficulty and weakness.  Hematological:  Bruises/bleeds easily.  Psychiatric/Behavioral:  Negative for agitation, dysphoric mood and sleep disturbance. The patient is not nervous/anxious.     Objective:  BP 138/88   Pulse 66   Temp 98.4 F (36.9 C) (Temporal)   Ht 5\' 10"  (1.778 m)   Wt 207 lb 4 oz (94 kg)   SpO2 94%   BMI 29.74 kg/m   BP Readings from Last 3 Encounters:  11/08/22 138/88  09/29/21 132/86  09/19/21 138/86    Wt Readings from Last 3 Encounters:  11/08/22 207 lb 4 oz (94 kg)  09/29/21 215 lb (97.5 kg)  09/19/21 214 lb (97.1 kg)    Physical Exam Constitutional:      General: He is not in acute distress.    Appearance: He is well-developed.     Comments: NAD  Eyes:     Conjunctiva/sclera: Conjunctivae normal.     Pupils: Pupils are equal, round, and reactive to light.  Neck:     Thyroid: No thyromegaly.  Vascular: No JVD.  Cardiovascular:     Rate and Rhythm: Normal rate and regular rhythm.     Heart sounds: Normal heart sounds. No murmur heard.    No friction rub. No gallop.  Pulmonary:     Effort: Pulmonary effort is normal. No respiratory distress.     Breath sounds: Normal breath sounds. No wheezing or rales.  Chest:     Chest wall: No tenderness.  Abdominal:     General: Bowel sounds are normal. There is no distension.     Palpations: Abdomen is soft. There is no mass.     Tenderness: There is no abdominal tenderness. There is no guarding or rebound.  Musculoskeletal:        General: No tenderness. Normal range of motion.     Cervical back: Normal range of motion.  Lymphadenopathy:      Cervical: No cervical adenopathy.  Skin:    General: Skin is warm and dry.     Findings: No rash.  Neurological:     Mental Status: He is alert and oriented to person, place, and time.     Cranial Nerves: No cranial nerve deficit.     Motor: No abnormal muscle tone.     Coordination: Coordination abnormal.     Gait: Gait abnormal.     Deep Tendon Reflexes: Reflexes are normal and symmetric.  Psychiatric:        Behavior: Behavior normal.        Thought Content: Thought content normal.        Judgment: Judgment normal.   Using a cane Arthritic gait   Lab Results  Component Value Date   WBC 7.1 09/29/2021   HGB 15.4 09/29/2021   HCT 45.3 09/29/2021   PLT 254.0 09/29/2021   GLUCOSE 96 09/29/2021   CHOL 106 05/13/2021   TRIG 58 05/13/2021   HDL 47 05/13/2021   LDLCALC 46 05/13/2021   ALT 26 09/29/2021   AST 39 (H) 09/29/2021   NA 138 09/29/2021   K 3.7 09/29/2021   CL 105 09/29/2021   CREATININE 0.97 09/29/2021   BUN 15 09/29/2021   CO2 26 09/29/2021   TSH 2.44 09/29/2021   PSA 0.44 09/29/2021    ECHOCARDIOGRAM COMPLETE  Result Date: 02/08/2021    ECHOCARDIOGRAM REPORT   Patient Name:   Mario Huerta Date of Exam: 02/08/2021 Medical Rec #:  [id number]       Height:       70.0 in Accession #:    [id number]      Weight:       207.2 lb Date of Birth:  [birth year]       BSA:          2.119 m Patient Age:    80 years        BP:           153/87 mmHg Patient Gender: M               HR:           61 bpm. Exam Location:  Outpatient Procedure: 2D Echo, Cardiac Doppler, Color Doppler and Intracardiac            Opacification Agent Indications:    Dyspnea  History:        Patient has no prior history of Echocardiogram examinations.                 CAD; Signs/Symptoms:Dyspnea.  Sonographer:    Roosvelt Maser  RDCS Referring Phys: [id number] Lynda Rainwater A ACHARYA IMPRESSIONS  1. Left ventricular ejection fraction, by estimation, is 45 to 50%. The left ventricle has mildly decreased function. The left  ventricle demonstrates regional wall motion abnormalities (see scoring diagram/findings for description). Left ventricular diastolic parameters are consistent with Grade I diastolic dysfunction (impaired relaxation). Dykinesis and hypokinesis of the mid to apical lateral and anteroseptal walls.  2. Right ventricular systolic function is normal. The right ventricular size is normal. Tricuspid regurgitation signal is inadequate for assessing PA pressure.  3. Left atrial size was mildly dilated.  4. The mitral valve is normal in structure. Mild mitral valve regurgitation. No evidence of mitral stenosis.  5. The aortic valve is normal in structure. Aortic valve regurgitation is trivial. No aortic stenosis is present.  6. Aortic dilatation noted. There is mild dilatation of the aortic root, measuring 41 mm.  7. The inferior vena cava is normal in size with greater than 50% respiratory variability, suggesting right atrial pressure of 3 mmHg. FINDINGS  Left Ventricle: Left ventricular ejection fraction, by estimation, is 45 to 50%. The left ventricle has mildly decreased function. The left ventricle demonstrates regional wall motion abnormalities. Definity contrast agent was given IV to delineate the left ventricular endocardial borders. The left ventricular internal cavity size was normal in size. There is no left ventricular hypertrophy. Left ventricular diastolic parameters are consistent with Grade I diastolic dysfunction (impaired relaxation).  LV Wall Scoring: Dykinesis and hypokinesis of the mid to apical lateral and anteroseptal walls. Right Ventricle: The right ventricular size is normal. No increase in right ventricular wall thickness. Right ventricular systolic function is normal. Tricuspid regurgitation signal is inadequate for assessing PA pressure. Left Atrium: Left atrial size was mildly dilated. Right Atrium: Right atrial size was normal in size. Pericardium: There is no evidence of pericardial effusion.  Mitral Valve: The mitral valve is normal in structure. Mild mitral valve regurgitation. No evidence of mitral valve stenosis. Tricuspid Valve: The tricuspid valve is normal in structure. Tricuspid valve regurgitation is trivial. No evidence of tricuspid stenosis. Aortic Valve: The aortic valve is normal in structure. Aortic valve regurgitation is trivial. No aortic stenosis is present. Aortic valve mean gradient measures 3.0 mmHg. Aortic valve peak gradient measures 5.5 mmHg. Aortic valve area, by VTI measures 3.25 cm. Pulmonic Valve: The pulmonic valve was normal in structure. Pulmonic valve regurgitation is trivial. No evidence of pulmonic stenosis. Aorta: Aortic dilatation noted. There is mild dilatation of the aortic root, measuring 41 mm. Venous: The inferior vena cava is normal in size with greater than 50% respiratory variability, suggesting right atrial pressure of 3 mmHg. IAS/Shunts: No atrial level shunt detected by color flow Doppler.  LEFT VENTRICLE PLAX 2D LVIDd:         4.60 cm  Diastology LVIDs:         3.20 cm  LV e' medial:    7.40 cm/s LV PW:         1.00 cm  LV E/e' medial:  7.9 LV IVS:        1.00 cm  LV e' lateral:   6.42 cm/s LVOT diam:     2.30 cm  LV E/e' lateral: 9.0 LV SV:         85 LV SV Index:   40 LVOT Area:     4.15 cm  RIGHT VENTRICLE RV Basal diam:  3.00 cm LEFT ATRIUM             Index  RIGHT ATRIUM           Index LA diam:        3.10 cm 1.46 cm/m  RA Area:     10.00 cm LA Vol (A2C):   52.9 ml 24.97 ml/m RA Volume:   19.20 ml  9.06 ml/m LA Vol (A4C):   79.2 ml 37.38 ml/m LA Biplane Vol: 70.2 ml 33.13 ml/m  AORTIC VALVE AV Area (Vmax):    3.62 cm AV Area (Vmean):   3.35 cm AV Area (VTI):     3.25 cm AV Vmax:           117.00 cm/s AV Vmean:          76.500 cm/s AV VTI:            0.262 m AV Peak Grad:      5.5 mmHg AV Mean Grad:      3.0 mmHg LVOT Vmax:         102.00 cm/s LVOT Vmean:        61.600 cm/s LVOT VTI:          0.205 m LVOT/AV VTI ratio: 0.78  AORTA Ao Root  diam: 4.10 cm Ao Asc diam:  3.20 cm MITRAL VALVE MV Area (PHT): 3.31 cm    SHUNTS MV Decel Time: 229 msec    Systemic VTI:  0.20 m MV E velocity: 58.10 cm/s  Systemic Diam: 2.30 cm MV A velocity: 78.30 cm/s MV E/A ratio:  0.74 Weston Brass MD Electronically signed by Weston Brass MD Signature Date/Time: 02/08/2021/5:14:41 PM    Final     Assessment & Plan:   Problem List Items Addressed This Visit     Low back pain    D/c Aleve Meloxicam 7.5 - 15 mg/d pc Tylenol prn On PPI  Potential benefits of a long term  NSAID use as well as potential risks  and complications were explained to the patient and were aknowledged.       Relevant Medications   meloxicam (MOBIC) 7.5 MG tablet   Barrett's esophagus with esophagitis    Cont on Protonix       Essential hypertension - Primary    Pt lost wt Tommy stopped his BP meds 2 wks ago Ok to d/c Metoprolol Cont w/other BP meds      Relevant Orders   TSH   Urinalysis   CBC with Differential/Platelet   Lipid panel   PSA   Comprehensive metabolic panel   Claudication (HCC)    Better after wt loss on diet      Relevant Medications   meloxicam (MOBIC) 7.5 MG tablet   Other Relevant Orders   TSH   Urinalysis   CBC with Differential/Platelet   Lipid panel   PSA   Comprehensive metabolic panel   Bladder neck obstruction   Relevant Orders   PSA   DOE (dyspnea on exertion)    Better after wt loss on diet      CAD (coronary artery disease)    Dr Annamaria Helling On Crestor CT angio IMPRESSION: 1. CT FFR analysis showed hemodynamically significant stenosis in the mid LAD or the proximal ramus intermedius.   2. 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 The vessel is small caliber in this region.        Relevant Orders   TSH   Urinalysis   CBC with Differential/Platelet   Lipid panel   PSA  Comprehensive metabolic panel   Other Visit Diagnoses      Dyslipidemia       Relevant Orders   TSH   Lipid panel         Meds ordered this encounter  Medications   meloxicam (MOBIC) 7.5 MG tablet    Sig: Take 1-2 tablets (7.5-15 mg total) by mouth daily as needed for pain.    Dispense:  60 tablet    Refill:  5      Follow-up: Return in about 6 months (around 05/10/2023) for a follow-up visit.  Sonda Primes, MD

## 2022-11-17 ENCOUNTER — Other Ambulatory Visit: Payer: Self-pay | Admitting: Internal Medicine

## 2022-11-29 DIAGNOSIS — M9905 Segmental and somatic dysfunction of pelvic region: Secondary | ICD-10-CM | POA: Diagnosis not present

## 2022-11-29 DIAGNOSIS — M5136 Other intervertebral disc degeneration, lumbar region: Secondary | ICD-10-CM | POA: Diagnosis not present

## 2022-11-29 DIAGNOSIS — M9903 Segmental and somatic dysfunction of lumbar region: Secondary | ICD-10-CM | POA: Diagnosis not present

## 2022-12-04 DIAGNOSIS — M9905 Segmental and somatic dysfunction of pelvic region: Secondary | ICD-10-CM | POA: Diagnosis not present

## 2022-12-04 DIAGNOSIS — M5136 Other intervertebral disc degeneration, lumbar region: Secondary | ICD-10-CM | POA: Diagnosis not present

## 2022-12-04 DIAGNOSIS — M9903 Segmental and somatic dysfunction of lumbar region: Secondary | ICD-10-CM | POA: Diagnosis not present

## 2022-12-07 DIAGNOSIS — M9905 Segmental and somatic dysfunction of pelvic region: Secondary | ICD-10-CM | POA: Diagnosis not present

## 2022-12-07 DIAGNOSIS — M9903 Segmental and somatic dysfunction of lumbar region: Secondary | ICD-10-CM | POA: Diagnosis not present

## 2022-12-07 DIAGNOSIS — M5136 Other intervertebral disc degeneration, lumbar region: Secondary | ICD-10-CM | POA: Diagnosis not present

## 2022-12-11 DIAGNOSIS — M5136 Other intervertebral disc degeneration, lumbar region: Secondary | ICD-10-CM | POA: Diagnosis not present

## 2022-12-11 DIAGNOSIS — M9905 Segmental and somatic dysfunction of pelvic region: Secondary | ICD-10-CM | POA: Diagnosis not present

## 2022-12-11 DIAGNOSIS — M9903 Segmental and somatic dysfunction of lumbar region: Secondary | ICD-10-CM | POA: Diagnosis not present

## 2022-12-13 DIAGNOSIS — M9905 Segmental and somatic dysfunction of pelvic region: Secondary | ICD-10-CM | POA: Diagnosis not present

## 2022-12-13 DIAGNOSIS — M5136 Other intervertebral disc degeneration, lumbar region: Secondary | ICD-10-CM | POA: Diagnosis not present

## 2022-12-13 DIAGNOSIS — M9903 Segmental and somatic dysfunction of lumbar region: Secondary | ICD-10-CM | POA: Diagnosis not present

## 2022-12-15 DIAGNOSIS — M5136 Other intervertebral disc degeneration, lumbar region: Secondary | ICD-10-CM | POA: Diagnosis not present

## 2022-12-15 DIAGNOSIS — M9903 Segmental and somatic dysfunction of lumbar region: Secondary | ICD-10-CM | POA: Diagnosis not present

## 2022-12-15 DIAGNOSIS — M9905 Segmental and somatic dysfunction of pelvic region: Secondary | ICD-10-CM | POA: Diagnosis not present

## 2022-12-18 DIAGNOSIS — M5136 Other intervertebral disc degeneration, lumbar region: Secondary | ICD-10-CM | POA: Diagnosis not present

## 2022-12-18 DIAGNOSIS — M9905 Segmental and somatic dysfunction of pelvic region: Secondary | ICD-10-CM | POA: Diagnosis not present

## 2022-12-18 DIAGNOSIS — M9903 Segmental and somatic dysfunction of lumbar region: Secondary | ICD-10-CM | POA: Diagnosis not present

## 2022-12-20 DIAGNOSIS — M9905 Segmental and somatic dysfunction of pelvic region: Secondary | ICD-10-CM | POA: Diagnosis not present

## 2022-12-20 DIAGNOSIS — M9903 Segmental and somatic dysfunction of lumbar region: Secondary | ICD-10-CM | POA: Diagnosis not present

## 2022-12-20 DIAGNOSIS — M5136 Other intervertebral disc degeneration, lumbar region: Secondary | ICD-10-CM | POA: Diagnosis not present

## 2022-12-22 DIAGNOSIS — M9905 Segmental and somatic dysfunction of pelvic region: Secondary | ICD-10-CM | POA: Diagnosis not present

## 2022-12-22 DIAGNOSIS — M5136 Other intervertebral disc degeneration, lumbar region: Secondary | ICD-10-CM | POA: Diagnosis not present

## 2022-12-22 DIAGNOSIS — M9903 Segmental and somatic dysfunction of lumbar region: Secondary | ICD-10-CM | POA: Diagnosis not present

## 2022-12-25 DIAGNOSIS — M9905 Segmental and somatic dysfunction of pelvic region: Secondary | ICD-10-CM | POA: Diagnosis not present

## 2022-12-25 DIAGNOSIS — M9903 Segmental and somatic dysfunction of lumbar region: Secondary | ICD-10-CM | POA: Diagnosis not present

## 2022-12-25 DIAGNOSIS — M5136 Other intervertebral disc degeneration, lumbar region: Secondary | ICD-10-CM | POA: Diagnosis not present

## 2022-12-27 DIAGNOSIS — M5136 Other intervertebral disc degeneration, lumbar region: Secondary | ICD-10-CM | POA: Diagnosis not present

## 2022-12-27 DIAGNOSIS — M9903 Segmental and somatic dysfunction of lumbar region: Secondary | ICD-10-CM | POA: Diagnosis not present

## 2022-12-27 DIAGNOSIS — M9905 Segmental and somatic dysfunction of pelvic region: Secondary | ICD-10-CM | POA: Diagnosis not present

## 2022-12-29 DIAGNOSIS — M5136 Other intervertebral disc degeneration, lumbar region: Secondary | ICD-10-CM | POA: Diagnosis not present

## 2022-12-29 DIAGNOSIS — M9905 Segmental and somatic dysfunction of pelvic region: Secondary | ICD-10-CM | POA: Diagnosis not present

## 2022-12-29 DIAGNOSIS — M9903 Segmental and somatic dysfunction of lumbar region: Secondary | ICD-10-CM | POA: Diagnosis not present

## 2023-01-01 DIAGNOSIS — M5136 Other intervertebral disc degeneration, lumbar region: Secondary | ICD-10-CM | POA: Diagnosis not present

## 2023-01-01 DIAGNOSIS — M9903 Segmental and somatic dysfunction of lumbar region: Secondary | ICD-10-CM | POA: Diagnosis not present

## 2023-01-01 DIAGNOSIS — M9905 Segmental and somatic dysfunction of pelvic region: Secondary | ICD-10-CM | POA: Diagnosis not present

## 2023-01-03 ENCOUNTER — Ambulatory Visit (INDEPENDENT_AMBULATORY_CARE_PROVIDER_SITE_OTHER): Payer: Medicare Other

## 2023-01-03 VITALS — Ht 70.0 in | Wt 205.0 lb

## 2023-01-03 DIAGNOSIS — Z0000 Encounter for general adult medical examination without abnormal findings: Secondary | ICD-10-CM

## 2023-01-03 DIAGNOSIS — M9905 Segmental and somatic dysfunction of pelvic region: Secondary | ICD-10-CM | POA: Diagnosis not present

## 2023-01-03 DIAGNOSIS — M9903 Segmental and somatic dysfunction of lumbar region: Secondary | ICD-10-CM | POA: Diagnosis not present

## 2023-01-03 DIAGNOSIS — M5136 Other intervertebral disc degeneration, lumbar region: Secondary | ICD-10-CM | POA: Diagnosis not present

## 2023-01-03 NOTE — Progress Notes (Addendum)
Subjective:   Mario Huerta is a 82 y.o. male who presents for Medicare Annual/Subsequent preventive examination.  Visit Complete: Virtual  I connected with  Phoebe Perch on 01/03/23 by a audio enabled telemedicine application and verified that I am speaking with the correct person using two identifiers.  Patient Location: Home  Provider Location: Office/Clinic  I discussed the limitations of evaluation and management by telemedicine. The patient expressed understanding and agreed to proceed.  Vital Signs: Patient was unable to self-report vital signs via telehealth due to a lack of equipment at home.   Review of Systems     Cardiac Risk Factors include: advanced age (>63men, >11 women);dyslipidemia;hypertension;male gender     Objective:    Today's Vitals   01/03/23 1016  Weight: 205 lb (93 kg)  Height: 5\' 10"  (1.778 m)  PainSc: 0-No pain   Body mass index is 29.41 kg/m.     01/03/2023   10:18 AM 01/03/2022    1:53 PM 01/24/2021   11:19 AM 11/30/2020   11:04 AM 10/08/2019    9:07 AM 10/07/2019    9:03 AM 02/12/2018    1:44 PM  Advanced Directives  Does Patient Have a Medical Advance Directive? Yes Yes No Yes Yes Yes No  Type of Estate agent of [place];Living will Living will;Healthcare Power of Attorney  Living will;Healthcare Power of [employer] Power of [place];Living will   Does patient want to make changes to medical advance directive?  No - Patient declined  No - Patient declined  No - Patient declined   Copy of Healthcare Power of Attorney in Chart? No - copy requested No - copy requested  No - copy requested  No - copy requested   Would patient like information on creating a medical advance directive?   No - Patient declined        Current Medications (verified) Outpatient Encounter Medications as of 01/03/2023  Medication Sig   amLODipine (NORVASC) 5 MG tablet TAKE 1 TABLET BY MOUTH DAILY   APPLE CIDER VINEGAR PO Take by  mouth.   aspirin EC 81 MG tablet Take 81 mg by mouth daily.   Cholecalciferol (VITAMIN D3) 50 MCG (2000 UT) capsule Take 1 capsule (2,000 Units total) by mouth daily.   finasteride (PROSCAR) 5 MG tablet Take 1 tablet (5 mg total) by mouth daily.   Glucosamine HCl (GLUCOSAMINE PO) Take by mouth.   Glucosamine-Chondroitin-MSM (TRIPLE FLEX PO) Take 1 tablet by mouth daily.   lisinopril-hydrochlorothiazide (ZESTORETIC) 20-12.5 MG tablet TAKE 1 TABLET BY MOUTH EVERY DAY   meloxicam (MOBIC) 7.5 MG tablet Take 1-2 tablets (7.5-15 mg total) by mouth daily as needed for pain.   metoprolol succinate (TOPROL-XL) 25 MG 24 hr tablet Take 1 tablet (25 mg total) by mouth daily. Take with or immediately following a meal.   Multiple Vitamins-Minerals (CENTRUM SILVER 50+MEN) TABS Take 1 tablet by mouth daily with breakfast.   Omega-3 Fatty Acids (FISH OIL) 1000 MG CAPS Take 1,000 mg by mouth daily.   pantoprazole (PROTONIX) 40 MG tablet TAKE 1 TABLET(40 MG) BY MOUTH DAILY   rosuvastatin (CRESTOR) 10 MG tablet Take 1 tablet (10 mg total) by mouth daily.   VITAMIN E PO Take 1 capsule by mouth daily.   No facility-administered encounter medications on file as of 01/03/2023.    Allergies (verified) Codeine, Gabapentin, Hydrocodone, Oxycodone, and Tramadol   History: Past Medical History:  Diagnosis Date   Arthritis    Food impaction  of esophagus    Gastric ulcer    Hypertension    Past Surgical History:  Procedure Laterality Date   BIOPSY  01/31/2018   Procedure: BIOPSY;  Surgeon: Kerin Salen, MD;  Location: [facility] ENDOSCOPY;  Service: Gastroenterology;;   ESOPHAGOGASTRODUODENOSCOPY N/A 01/31/2018   Procedure: ESOPHAGOGASTRODUODENOSCOPY (EGD);  Surgeon: Kerin Salen, MD;  Location: [facility] ENDOSCOPY;  Service: Gastroenterology;  Laterality: N/A;   FOREIGN BODY REMOVAL  01/31/2018   Procedure: FOREIGN BODY REMOVAL;  Surgeon: Kerin Salen, MD;  Location: [facility] ENDOSCOPY;  Service: Gastroenterology;;   HERNIA REPAIR      MOUTH SURGERY  2019   TOTAL HIP ARTHROPLASTY Right    Family History  Problem Relation Age of Onset   Colon cancer Neg Hx    Esophageal cancer Neg Hx    Pancreatic cancer Neg Hx    Stomach cancer Neg Hx    Liver disease Neg Hx    Social History   Socioeconomic History   Marital status: Widowed    Spouse name: Not on file   Number of children: Not on file   Years of education: Not on file   Highest education level: Not on file  Occupational History   Not on file  Tobacco Use   Smoking status: Never   Smokeless tobacco: Never  Vaping Use   Vaping status: Never Used  Substance and Sexual Activity   Alcohol use: Yes    Comment: occasional   Drug use: Never   Sexual activity: Yes    Partners: Female  Other Topics Concern   Not on file  Social History Narrative   Right handed   One story home   Drinks caffeine coffee qam   Social Determinants of Health   Financial Resource Strain: Low Risk  (01/03/2023)   Overall Financial Resource Strain (CARDIA)    Difficulty of Paying Living Expenses: Not hard at all  Food Insecurity: No Food Insecurity (01/03/2023)   Hunger Vital Sign    Worried About Running Out of Food in the Last Year: Never true    Ran Out of Food in the Last Year: Never true  Transportation Needs: No Transportation Needs (01/03/2023)   PRAPARE - Administrator, Civil Service (Medical): No    Lack of Transportation (Non-Medical): No  Physical Activity: Sufficiently Active (01/03/2023)   Exercise Vital Sign    Days of Exercise per Week: 5 days    Minutes of Exercise per Session: 30 min  Stress: No Stress Concern Present (01/03/2023)   [employer] of Occupational Health - Occupational Stress Questionnaire    Feeling of Stress : Not at all  Social Connections: Socially Isolated (01/03/2023)   Social Connection and Isolation Panel [NHANES]    Frequency of Communication with Friends and Family: More than three times a week    Frequency of Social  Gatherings with Friends and Family: More than three times a week    Attends Religious Services: Never    Database administrator or Organizations: No    Attends Banker Meetings: Never    Marital Status: Widowed    Tobacco Counseling Counseling given: Not Answered   Clinical Intake:  Pre-visit preparation completed: Yes  Pain : No/denies pain Pain Score: 0-No pain     BMI - recorded: 29.41 Nutritional Status: BMI 25 -29 Overweight Nutritional Risks: None Diabetes: No  How often do you need to have someone help you when you read instructions, pamphlets, or other written materials from your doctor  or pharmacy?: 1 - Never What is the last grade level you completed in school?: HSG  Interpreter Needed?: No  Information entered by ::  N. , LPN.   Activities of Daily Living    01/03/2023   10:20 AM  In your present state of health, do you have any difficulty performing the following activities:  Hearing? 0  Vision? 0  Difficulty concentrating or making decisions? 0  Walking or climbing stairs? 0  Dressing or bathing? 0  Doing errands, shopping? 0  Preparing Food and eating ? N  Using the Toilet? N  In the past six months, have you accidently leaked urine? N  Do you have problems with loss of bowel control? N  Managing your Medications? N  Managing your Finances? N  Housekeeping or managing your Housekeeping? N    Patient Care Team: Plotnikov, Georgina Quint, MD as PCP - General (Internal Medicine) Parke Poisson, MD as PCP - Cardiology (Cardiology) Drema Dallas, DO as Consulting Physician (Neurology)  Indicate any recent Medical Services you may have received from other than [facility] providers in the past year (date may be approximate).     Assessment:   This is a routine wellness examination for Mario Huerta.  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        Depression Screen    01/03/2023   10:19 AM 11/08/2022    8:17 AM 01/03/2022    1:41 PM 11/30/2020   11:03 AM 10/08/2019    9:07 AM 10/07/2019    9:04 AM  PHQ 2/9 Scores  PHQ - 2 Score 0 0 0 0 0 0  PHQ- 9 Score 0         Fall Risk    01/03/2023   10:19 AM 11/08/2022    8:17 AM 01/03/2022    1:39 PM 11/30/2020   11:05 AM 10/08/2019    9:07 AM  Fall Risk   Falls in the past year? 0 0 0 0 0  Number falls in past yr: 0 0 0 0 0  Injury with Fall? 0 0 0 0 0  Risk for fall due to : No Fall Risks No Fall Risks No Fall Risks No Fall Risks   Risk for fall due to: Comment  cane     Follow up Falls prevention discussed Falls evaluation completed Falls evaluation completed Falls evaluation completed     MEDICARE RISK AT HOME:  Medicare Risk at Home - 01/03/23 1021     Any stairs in or around the home? No    If so, are there any without handrails? No    Home free of loose throw rugs in walkways, pet beds, electrical cords, etc? Yes    Adequate lighting in your home to reduce risk of falls? Yes    Life alert? No    Use of a cane, walker or w/c? Yes    Grab bars in the bathroom? No    Shower chair or bench in shower? No    Elevated toilet seat or a handicapped toilet? No  TIMED UP AND GO:  Was the test performed?  No    Cognitive Function:        01/03/2023   10:20 AM 01/03/2022    1:56 PM 10/07/2019    9:05 AM  6CIT Screen  What Year? 0 points 0 points 0 points  What month? 0 points 0 points 0 points  What time? 0 points 0 points 0 points  Count back from 20 0 points 0 points 0 points  Months in reverse 0 points 0 points 0 points  Repeat phrase 0 points 0 points 0 points  Total Score 0 points 0 points 0 points     Immunizations Immunization History  Administered Date(s) Administered   Tdap 06/17/2018    TDAP status: Up to date  Flu Vaccine status: Declined, Education has been provided regarding the importance of this vaccine but patient still declined. Advised may receive this vaccine at local pharmacy or Health Dept. Aware to provide a copy of the vaccination record if obtained from local pharmacy or Health Dept. Verbalized acceptance and understanding.  Pneumococcal vaccine status: Declined,  Education has been provided regarding the importance of this vaccine but patient still declined. Advised may receive this vaccine at local pharmacy or Health Dept. Aware to provide a copy of the vaccination record if obtained from local pharmacy or Health Dept. Verbalized acceptance and understanding.   Covid-19 vaccine status: Declined, Education has been provided regarding the importance of this vaccine but patient still declined. Advised may receive this vaccine at local pharmacy or Health Dept.or vaccine clinic. Aware to provide a copy of the vaccination record if obtained from local pharmacy or Health Dept. Verbalized acceptance and understanding.  Qualifies for Shingles Vaccine? Yes   Zostavax completed No   Shingrix Completed?: No.    Education has been provided regarding the importance of this vaccine. Patient has been advised to call insurance company to determine out of pocket expense if they have not yet received this vaccine. Advised may also receive vaccine at local pharmacy or Health Dept. Verbalized acceptance and understanding.  Screening Tests Health Maintenance  Topic Date Due   Zoster Vaccines- Shingrix (1 of 2) Never done   Pneumonia Vaccine 13+ Years old (1 of 1 - PCV) Never done   INFLUENZA VACCINE  12/28/2022   Medicare Annual Wellness (AWV)  01/03/2024   DTaP/Tdap/Td (2 - Td or Tdap) 06/17/2028   HPV VACCINES  Aged Out   COVID-19 Vaccine  Discontinued    Health  Maintenance  Health Maintenance Due  Topic Date Due   Zoster Vaccines- Shingrix (1 of 2) Never done   Pneumonia Vaccine 3+ Years old (1 of 1 - PCV) Never done   INFLUENZA VACCINE  12/28/2022    Colorectal cancer screening: No longer required.   Lung Cancer Screening: (Low Dose CT Chest recommended if Age 63-80 years, 20 pack-year currently smoking OR have quit w/in 15years.) does not qualify.   Lung Cancer Screening Referral: no  Additional Screening:  Hepatitis C Screening: does not qualify; Completed: no  Vision Screening: Recommended annual ophthalmology exams for early detection of glaucoma and other disorders of the eye. Is the patient up to date with their annual eye exam?  Yes  Who is the provider or what is the name of the office in which the patient attends annual eye exams? [facility] If pt is not established with a provider, would they like to be referred to a provider to establish care? No .  Dental Screening: Recommended annual dental exams for proper oral hygiene  Diabetic Foot Exam: Does not apply.  Community Resource Referral / Chronic Care Management: CRR required this visit?  No   CCM required this visit?  No     Plan:     I have personally reviewed and noted the following in the patient's chart:   Medical and social history Use of alcohol, tobacco or illicit drugs  Current medications and supplements including opioid prescriptions. Patient is not currently taking opioid prescriptions. Functional ability and status Nutritional status Physical activity Advanced directives List of other physicians Hospitalizations, surgeries, and ER visits in previous 12 months Vitals Screenings to include cognitive, depression, and falls Referrals and appointments  In addition, I have reviewed and discussed with patient certain preventive protocols, quality metrics, and best practice recommendations. A written personalized care plan for preventive  services as well as general preventive health recommendations were provided to patient.     Mickeal Needy, LPN   06/03/6061   After Visit Summary: (Mail) Due to this being a telephonic visit, the after visit summary with patients personalized plan was offered to patient via mail   Nurse Notes: Normal cognitive status assessed by direct observation via telephone conversation by this Nurse Health Advisor. No abnormalities found.   Medical screening examination/treatment/procedure(s) were performed by non-physician practitioner and as supervising physician I was immediately available for consultation/collaboration.  I agree with above. Jacinta Shoe, MD

## 2023-01-03 NOTE — Patient Instructions (Signed)
Mario Huerta , Thank you for taking time to come for your Medicare Wellness Visit. I appreciate your ongoing commitment to your health goals. Please review the following plan we discussed and let me know if I can assist you in the future.   Referrals/Orders/Follow-Ups/Clinician Recommendations: No  This is a list of the screening recommended for you and due dates:  Health Maintenance  Topic Date Due   Zoster (Shingles) Vaccine (1 of 2) Never done   Pneumonia Vaccine (1 of 1 - PCV) Never done   Flu Shot  12/28/2022   Medicare Annual Wellness Visit  01/03/2024   DTaP/Tdap/Td vaccine (2 - Td or Tdap) 06/17/2028   HPV Vaccine  Aged Out   COVID-19 Vaccine  Discontinued    Advanced directives: (Copy Requested) Please bring a copy of your health care power of attorney and living will to the office to be added to your chart at your convenience.  Next Medicare Annual Wellness Visit scheduled for next year: Yes  Preventive Care 72 Years and Older, Male  Preventive care refers to lifestyle choices and visits with your health care provider that can promote health and wellness. What does preventive care include? A yearly physical exam. This is also called an annual well check. Dental exams once or twice a year. Routine eye exams. Ask your health care provider how often you should have your eyes checked. Personal lifestyle choices, including: Daily care of your teeth and gums. Regular physical activity. Eating a healthy diet. Avoiding tobacco and drug use. Limiting alcohol use. Practicing safe sex. Taking low doses of aspirin every day. Taking vitamin and mineral supplements as recommended by your health care provider. What happens during an annual well check? The services and screenings done by your health care provider during your annual well check will depend on your age, overall health, lifestyle risk factors, and family history of disease. Counseling  Your health care provider may ask  you questions about your: Alcohol use. Tobacco use. Drug use. Emotional well-being. Home and relationship well-being. Sexual activity. Eating habits. History of falls. Memory and ability to understand (cognition). Work and work Astronomer. Screening  You may have the following tests or measurements: Height, weight, and BMI. Blood pressure. Lipid and cholesterol levels. These may be checked every 5 years, or more frequently if you are over 71 years old. Skin check. Lung cancer screening. You may have this screening every year starting at age 70 if you have a 30-pack-year history of smoking and currently smoke or have quit within the past 15 years. Fecal occult blood test (FOBT) of the stool. You may have this test every year starting at age 74. Flexible sigmoidoscopy or colonoscopy. You may have a sigmoidoscopy every 5 years or a colonoscopy every 10 years starting at age 40. Prostate cancer screening. Recommendations will vary depending on your family history and other risks. Hepatitis C blood test. Hepatitis B blood test. Sexually transmitted disease (STD) testing. Diabetes screening. This is done by checking your blood sugar (glucose) after you have not eaten for a while (fasting). You may have this done every 1-3 years. Abdominal aortic aneurysm (AAA) screening. You may need this if you are a current or former smoker. Osteoporosis. You may be screened starting at age 67 if you are at high risk. Talk with your health care provider about your test results, treatment options, and if necessary, the need for more tests. Vaccines  Your health care provider may recommend certain vaccines, such as: Influenza vaccine.  This is recommended every year. Tetanus, diphtheria, and acellular pertussis (Tdap, Td) vaccine. You may need a Td booster every 10 years. Zoster vaccine. You may need this after age 48. Pneumococcal 13-valent conjugate (PCV13) vaccine. One dose is recommended after age  41. Pneumococcal polysaccharide (PPSV23) vaccine. One dose is recommended after age 84. Talk to your health care provider about which screenings and vaccines you need and how often you need them. This information is not intended to replace advice given to you by your health care provider. Make sure you discuss any questions you have with your health care provider. Document Released: 06/11/2015 Document Revised: 02/02/2016 Document Reviewed: 03/16/2015 Elsevier Interactive Patient Education  2017 [employer].  Fall Prevention in the Home Falls can cause injuries. They can happen to people of all ages. There are many things you can do to make your home safe and to help prevent falls. What can I do on the outside of my home? Regularly fix the edges of walkways and driveways and fix any cracks. Remove anything that might make you trip as you walk through a door, such as a raised step or threshold. Trim any bushes or trees on the path to your home. Use bright outdoor lighting. Clear any walking paths of anything that might make someone trip, such as rocks or tools. Regularly check to see if handrails are loose or broken. Make sure that both sides of any steps have handrails. Any raised decks and porches should have guardrails on the edges. Have any leaves, snow, or ice cleared regularly. Use sand or salt on walking paths during winter. Clean up any spills in your garage right away. This includes oil or grease spills. What can I do in the bathroom? Use night lights. Install grab bars by the toilet and in the tub and shower. Do not use towel bars as grab bars. Use non-skid mats or decals in the tub or shower. If you need to sit down in the shower, use a plastic, non-slip stool. Keep the floor dry. Clean up any water that spills on the floor as soon as it happens. Remove soap buildup in the tub or shower regularly. Attach bath mats securely with double-sided non-slip rug tape. Do not have throw  rugs and other things on the floor that can make you trip. What can I do in the bedroom? Use night lights. Make sure that you have a light by your bed that is easy to reach. Do not use any sheets or blankets that are too big for your bed. They should not hang down onto the floor. Have a firm chair that has side arms. You can use this for support while you get dressed. Do not have throw rugs and other things on the floor that can make you trip. What can I do in the kitchen? Clean up any spills right away. Avoid walking on wet floors. Keep items that you use a lot in easy-to-reach places. If you need to reach something above you, use a strong step stool that has a grab bar. Keep electrical cords out of the way. Do not use floor polish or wax that makes floors slippery. If you must use wax, use non-skid floor wax. Do not have throw rugs and other things on the floor that can make you trip. What can I do with my stairs? Do not leave any items on the stairs. Make sure that there are handrails on both sides of the stairs and use them. Fix handrails  that are broken or loose. Make sure that handrails are as long as the stairways. Check any carpeting to make sure that it is firmly attached to the stairs. Fix any carpet that is loose or worn. Avoid having throw rugs at the top or bottom of the stairs. If you do have throw rugs, attach them to the floor with carpet tape. Make sure that you have a light switch at the top of the stairs and the bottom of the stairs. If you do not have them, ask someone to add them for you. What else can I do to help prevent falls? Wear shoes that: Do not have high heels. Have rubber bottoms. Are comfortable and fit you well. Are closed at the toe. Do not wear sandals. If you use a stepladder: Make sure that it is fully opened. Do not climb a closed stepladder. Make sure that both sides of the stepladder are locked into place. Ask someone to hold it for you, if  possible. Clearly mark and make sure that you can see: Any grab bars or handrails. First and last steps. Where the edge of each step is. Use tools that help you move around (mobility aids) if they are needed. These include: Canes. Walkers. Scooters. Crutches. Turn on the lights when you go into a dark area. Replace any light bulbs as soon as they burn out. Set up your furniture so you have a clear path. Avoid moving your furniture around. If any of your floors are uneven, fix them. If there are any pets around you, be aware of where they are. Review your medicines with your doctor. Some medicines can make you feel dizzy. This can increase your chance of falling. Ask your doctor what other things that you can do to help prevent falls. This information is not intended to replace advice given to you by your health care provider. Make sure you discuss any questions you have with your health care provider. Document Released: 03/11/2009 Document Revised: 10/21/2015 Document Reviewed: 06/19/2014 Elsevier Interactive Patient Education  2017 [employer].

## 2023-01-05 DIAGNOSIS — M5136 Other intervertebral disc degeneration, lumbar region: Secondary | ICD-10-CM | POA: Diagnosis not present

## 2023-01-05 DIAGNOSIS — M9903 Segmental and somatic dysfunction of lumbar region: Secondary | ICD-10-CM | POA: Diagnosis not present

## 2023-01-05 DIAGNOSIS — M9905 Segmental and somatic dysfunction of pelvic region: Secondary | ICD-10-CM | POA: Diagnosis not present

## 2023-01-08 DIAGNOSIS — M9905 Segmental and somatic dysfunction of pelvic region: Secondary | ICD-10-CM | POA: Diagnosis not present

## 2023-01-08 DIAGNOSIS — M5136 Other intervertebral disc degeneration, lumbar region: Secondary | ICD-10-CM | POA: Diagnosis not present

## 2023-01-08 DIAGNOSIS — M9903 Segmental and somatic dysfunction of lumbar region: Secondary | ICD-10-CM | POA: Diagnosis not present

## 2023-01-15 DIAGNOSIS — M9905 Segmental and somatic dysfunction of pelvic region: Secondary | ICD-10-CM | POA: Diagnosis not present

## 2023-01-15 DIAGNOSIS — M5136 Other intervertebral disc degeneration, lumbar region: Secondary | ICD-10-CM | POA: Diagnosis not present

## 2023-01-15 DIAGNOSIS — M9903 Segmental and somatic dysfunction of lumbar region: Secondary | ICD-10-CM | POA: Diagnosis not present

## 2023-02-05 DIAGNOSIS — M9903 Segmental and somatic dysfunction of lumbar region: Secondary | ICD-10-CM | POA: Diagnosis not present

## 2023-02-05 DIAGNOSIS — M5136 Other intervertebral disc degeneration, lumbar region: Secondary | ICD-10-CM | POA: Diagnosis not present

## 2023-02-05 DIAGNOSIS — M9905 Segmental and somatic dysfunction of pelvic region: Secondary | ICD-10-CM | POA: Diagnosis not present

## 2023-02-08 DIAGNOSIS — M9905 Segmental and somatic dysfunction of pelvic region: Secondary | ICD-10-CM | POA: Diagnosis not present

## 2023-02-08 DIAGNOSIS — M5136 Other intervertebral disc degeneration, lumbar region: Secondary | ICD-10-CM | POA: Diagnosis not present

## 2023-02-08 DIAGNOSIS — M9903 Segmental and somatic dysfunction of lumbar region: Secondary | ICD-10-CM | POA: Diagnosis not present

## 2023-02-12 ENCOUNTER — Ambulatory Visit: Payer: Medicare Other | Admitting: Adult Health

## 2023-02-12 DIAGNOSIS — M9903 Segmental and somatic dysfunction of lumbar region: Secondary | ICD-10-CM | POA: Diagnosis not present

## 2023-02-12 DIAGNOSIS — M5136 Other intervertebral disc degeneration, lumbar region: Secondary | ICD-10-CM | POA: Diagnosis not present

## 2023-02-12 DIAGNOSIS — M9905 Segmental and somatic dysfunction of pelvic region: Secondary | ICD-10-CM | POA: Diagnosis not present

## 2023-02-15 ENCOUNTER — Other Ambulatory Visit: Payer: Self-pay | Admitting: Internal Medicine

## 2023-02-16 DIAGNOSIS — M5136 Other intervertebral disc degeneration, lumbar region: Secondary | ICD-10-CM | POA: Diagnosis not present

## 2023-02-16 DIAGNOSIS — M9905 Segmental and somatic dysfunction of pelvic region: Secondary | ICD-10-CM | POA: Diagnosis not present

## 2023-02-16 DIAGNOSIS — M9903 Segmental and somatic dysfunction of lumbar region: Secondary | ICD-10-CM | POA: Diagnosis not present

## 2023-02-19 ENCOUNTER — Ambulatory Visit: Payer: Medicare Other | Admitting: Internal Medicine

## 2023-02-19 VITALS — BP 120/70 | HR 67 | Temp 97.9°F | Ht 70.0 in | Wt 188.0 lb

## 2023-02-19 DIAGNOSIS — R269 Unspecified abnormalities of gait and mobility: Secondary | ICD-10-CM

## 2023-02-19 DIAGNOSIS — E559 Vitamin D deficiency, unspecified: Secondary | ICD-10-CM | POA: Diagnosis not present

## 2023-02-19 DIAGNOSIS — R109 Unspecified abdominal pain: Secondary | ICD-10-CM | POA: Insufficient documentation

## 2023-02-19 DIAGNOSIS — K222 Esophageal obstruction: Secondary | ICD-10-CM | POA: Diagnosis not present

## 2023-02-19 DIAGNOSIS — R202 Paresthesia of skin: Secondary | ICD-10-CM

## 2023-02-19 DIAGNOSIS — M545 Low back pain, unspecified: Secondary | ICD-10-CM

## 2023-02-19 DIAGNOSIS — G8929 Other chronic pain: Secondary | ICD-10-CM | POA: Diagnosis not present

## 2023-02-19 DIAGNOSIS — R1013 Epigastric pain: Secondary | ICD-10-CM | POA: Diagnosis not present

## 2023-02-19 LAB — CBC WITH DIFFERENTIAL/PLATELET
Basophils Absolute: 0.1 10*3/uL (ref 0.0–0.1)
Basophils Relative: 0.9 % (ref 0.0–3.0)
Eosinophils Absolute: 0.4 10*3/uL (ref 0.0–0.7)
Eosinophils Relative: 4.9 % (ref 0.0–5.0)
HCT: 37.8 % — ABNORMAL LOW (ref 39.0–52.0)
Hemoglobin: 12.8 g/dL — ABNORMAL LOW (ref 13.0–17.0)
Lymphocytes Relative: 18.7 % (ref 12.0–46.0)
Lymphs Abs: 1.5 10*3/uL (ref 0.7–4.0)
MCHC: 33.9 g/dL (ref 30.0–36.0)
MCV: 92.9 fl (ref 78.0–100.0)
Monocytes Absolute: 0.6 10*3/uL (ref 0.1–1.0)
Monocytes Relative: 7.5 % (ref 3.0–12.0)
Neutro Abs: 5.4 10*3/uL (ref 1.4–7.7)
Neutrophils Relative %: 68 % (ref 43.0–77.0)
Platelets: 420 10*3/uL — ABNORMAL HIGH (ref 150.0–400.0)
RBC: 4.07 Mil/uL — ABNORMAL LOW (ref 4.22–5.81)
RDW: 13.4 % (ref 11.5–15.5)
WBC: 7.9 10*3/uL (ref 4.0–10.5)

## 2023-02-19 LAB — COMPREHENSIVE METABOLIC PANEL
ALT: 26 U/L (ref 0–53)
AST: 37 U/L (ref 0–37)
Albumin: 3.5 g/dL (ref 3.5–5.2)
Alkaline Phosphatase: 100 U/L (ref 39–117)
BUN: 16 mg/dL (ref 6–23)
CO2: 27 mEq/L (ref 19–32)
Calcium: 9.5 mg/dL (ref 8.4–10.5)
Chloride: 104 mEq/L (ref 96–112)
Creatinine, Ser: 0.79 mg/dL (ref 0.40–1.50)
GFR: 82.2 mL/min (ref >60.00)
Glucose, Bld: 87 mg/dL (ref 70–99)
Potassium: 3.7 mEq/L (ref 3.5–5.1)
Sodium: 141 mEq/L (ref 135–145)
Total Bilirubin: 0.4 mg/dL (ref 0.2–1.2)
Total Protein: 6.9 g/dL (ref 6.0–8.3)

## 2023-02-19 LAB — LIPASE: Lipase: 170 U/L — ABNORMAL HIGH (ref 11.0–59.0)

## 2023-02-19 LAB — SEDIMENTATION RATE: Sed Rate: 63 mm/hr — ABNORMAL HIGH (ref 0–20)

## 2023-02-19 LAB — URINALYSIS
Bilirubin Urine: NEGATIVE
Hgb urine dipstick: NEGATIVE
Ketones, ur: NEGATIVE
Leukocytes,Ua: NEGATIVE
Nitrite: NEGATIVE
Specific Gravity, Urine: 1.015 (ref 1.000–1.030)
Total Protein, Urine: NEGATIVE
Urine Glucose: NEGATIVE
Urobilinogen, UA: 0.2 (ref 0.0–1.0)
pH: 8.5 — AB (ref 5.0–8.0)

## 2023-02-19 LAB — VITAMIN B12: Vitamin B-12: 444 pg/mL (ref 211–911)

## 2023-02-19 LAB — CK: Total CK: 321 U/L — ABNORMAL HIGH (ref 7–232)

## 2023-02-19 LAB — VITAMIN D 25 HYDROXY (VIT D DEFICIENCY, FRACTURES): VITD: 67.59 ng/mL (ref 30.00–100.00)

## 2023-02-19 LAB — TSH: TSH: 1.89 u[IU]/mL (ref 0.35–5.50)

## 2023-02-19 MED ORDER — PANTOPRAZOLE SODIUM 40 MG PO TBEC: 40.0000 mg | DELAYED_RELEASE_TABLET | Freq: Two times a day (BID) | ORAL | 5 refills | Status: DC

## 2023-02-19 NOTE — Assessment & Plan Note (Addendum)
Worse over past 6 months - legs are weaker. Hold statin. Fish oil instead for now. Check CK Use rollator, walker

## 2023-02-19 NOTE — Assessment & Plan Note (Signed)
Recent - resolved after Chiropractic treatment

## 2023-02-19 NOTE — Assessment & Plan Note (Signed)
Stable

## 2023-02-19 NOTE — Assessment & Plan Note (Signed)
No relapse

## 2023-02-19 NOTE — Patient Instructions (Signed)
Hold Rosuvastatin Hold Meloxicam Pantaprazole twice a day

## 2023-02-19 NOTE — Progress Notes (Signed)
Subjective:  Patient ID: Mario Huerta, male    DOB: 11/12/1940  Age: 82 y.o. MRN: [id number]  CC: Abdominal Pain (Pian above belly button and sometimes radiates right side of pelvis/groin area)   HPI Mario Huerta presents for epigastric pain x 3 weeks daily Pain is bad at night On Meloxicam 1-2 a day Not taking Protonix' C/o leg weakness - worse over past 6 months  He is here w/his dtr Mario Huerta   Outpatient Medications Prior to Visit  Medication Sig Dispense Refill  . amLODipine (NORVASC) 5 MG tablet TAKE 1 TABLET BY MOUTH DAILY 90 tablet 1  . APPLE CIDER VINEGAR PO Take by mouth.    Marland Kitchen aspirin EC 81 MG tablet Take 81 mg by mouth daily.    . Cholecalciferol (VITAMIN D3) 50 MCG (2000 UT) capsule Take 1 capsule (2,000 Units total) by mouth daily. 100 capsule 3  . finasteride (PROSCAR) 5 MG tablet Take 1 tablet (5 mg total) by mouth daily. 90 tablet 3  . Glucosamine HCl (GLUCOSAMINE PO) Take by mouth.    . Glucosamine-Chondroitin-MSM (TRIPLE FLEX PO) Take 1 tablet by mouth daily.    Marland Kitchen lisinopril-hydrochlorothiazide (ZESTORETIC) 20-12.5 MG tablet TAKE 1 TABLET BY MOUTH EVERY DAY 90 tablet 1  . meloxicam (MOBIC) 7.5 MG tablet Take 1-2 tablets (7.5-15 mg total) by mouth daily as needed for pain. 60 tablet 5  . metoprolol succinate (TOPROL-XL) 25 MG 24 hr tablet Take 1 tablet (25 mg total) by mouth daily. Take with or immediately following a meal. 90 tablet 3  . Multiple Vitamins-Minerals (CENTRUM SILVER 50+MEN) TABS Take 1 tablet by mouth daily with breakfast.    . Omega-3 Fatty Acids (FISH OIL) 1000 MG CAPS Take 1,000 mg by mouth daily.    . rosuvastatin (CRESTOR) 10 MG tablet Take 1 tablet (10 mg total) by mouth daily. 90 tablet 3  . VITAMIN E PO Take 1 capsule by mouth daily.    . pantoprazole (PROTONIX) 40 MG tablet TAKE 1 TABLET(40 MG) BY MOUTH DAILY 30 tablet 0   No facility-administered medications prior to visit.    ROS: Review of Systems  Constitutional:  Positive for  fatigue and unexpected weight change. Negative for appetite change.  HENT:  Negative for congestion, nosebleeds, sneezing, sore throat and trouble swallowing.   Eyes:  Negative for itching and visual disturbance.  Respiratory:  Negative for cough.   Cardiovascular:  Negative for chest pain, palpitations and leg swelling.  Gastrointestinal:  Positive for abdominal pain and constipation. Negative for abdominal distention, anal bleeding, blood in stool, diarrhea, nausea, rectal pain and vomiting.  Genitourinary:  Negative for frequency and hematuria.  Musculoskeletal:  Positive for gait problem. Negative for back pain, joint swelling, neck pain and neck stiffness.  Skin:  Negative for color change and rash.  Neurological:  Positive for weakness. Negative for dizziness, tremors and speech difficulty.  Hematological:  Does not bruise/bleed easily.  Psychiatric/Behavioral:  Negative for agitation, dysphoric mood and sleep disturbance. The patient is not nervous/anxious.     Objective:  BP 120/70 (BP Location: Left Arm, Patient Position: Sitting, Cuff Size: Large)   Pulse 67   Temp 97.9 F (36.6 C) (Oral)   Ht 5\' 10"  (1.778 m)   Wt 188 lb (85.3 kg)   SpO2 97%   BMI 26.98 kg/m   BP Readings from Last 3 Encounters:  02/19/23 120/70  11/08/22 138/88  09/29/21 132/86    Wt Readings from Last 3 Encounters:  02/19/23  188 lb (85.3 kg)  01/03/23 205 lb (93 kg)  11/08/22 207 lb 4 oz (94 kg)    Physical Exam Constitutional:      General: He is not in acute distress.    Appearance: He is well-developed.     Comments: NAD  Eyes:     Conjunctiva/sclera: Conjunctivae normal.     Pupils: Pupils are equal, round, and reactive to light.  Neck:     Thyroid: No thyromegaly.     Vascular: No JVD.  Cardiovascular:     Rate and Rhythm: Normal rate and regular rhythm.     Heart sounds: Normal heart sounds. No murmur heard.    No friction rub. No gallop.  Pulmonary:     Effort: Pulmonary effort  is normal. No respiratory distress.     Breath sounds: Normal breath sounds. No wheezing or rales.  Chest:     Chest wall: No tenderness.  Abdominal:     General: Bowel sounds are normal. There is no distension or abdominal bruit.     Palpations: Abdomen is soft. There is no mass.     Tenderness: There is no abdominal tenderness. There is no guarding or rebound.     Hernia: No hernia is present.  Musculoskeletal:        General: No tenderness. Normal range of motion.     Cervical back: Normal range of motion.  Lymphadenopathy:     Cervical: No cervical adenopathy.  Skin:    General: Skin is warm and dry.     Findings: No rash.  Neurological:     Mental Status: He is alert and oriented to person, place, and time.     Cranial Nerves: No cranial nerve deficit.     Motor: Weakness present. No abnormal muscle tone.     Coordination: Coordination normal.     Gait: Gait normal.     Deep Tendon Reflexes: Reflexes are normal and symmetric.  Psychiatric:        Mood and Affect: Mood normal.        Behavior: Behavior normal.        Thought Content: Thought content normal.        Judgment: Judgment normal.   Weak legs, unsteady gait Using a walker  Lab Results  Component Value Date   WBC 5.4 11/08/2022   HGB 15.7 11/08/2022   HCT 45.6 11/08/2022   PLT 248.0 11/08/2022   GLUCOSE 86 11/08/2022   CHOL 93 11/08/2022   TRIG 119.0 11/08/2022   HDL 45.50 11/08/2022   LDLCALC 24 11/08/2022   ALT 28 11/08/2022   AST 41 (H) 11/08/2022   NA 139 11/08/2022   K 4.1 11/08/2022   CL 106 11/08/2022   CREATININE 0.91 11/08/2022   BUN 17 11/08/2022   CO2 24 11/08/2022   TSH 2.73 11/08/2022   PSA 1.44 11/08/2022    ECHOCARDIOGRAM COMPLETE  Result Date: 02/08/2021    ECHOCARDIOGRAM REPORT   Patient Name:   Mario Huerta Date of Exam: 02/08/2021 Medical Rec #:  [id number]       Height:       70.0 in Accession #:    [id number]      Weight:       207.2 lb Date of Birth:  1940/10/15       BSA:           2.119 m Patient Age:    80 years        BP:  153/87 mmHg Patient Gender: M               HR:           61 bpm. Exam Location:  Outpatient Procedure: 2D Echo, Cardiac Doppler, Color Doppler and Intracardiac            Opacification Agent Indications:    Dyspnea  History:        Patient has no prior history of Echocardiogram examinations.                 CAD; Signs/Symptoms:Dyspnea.  Sonographer:    Roosvelt Maser RDCS Referring Phys: [id number] GAYATRI A ACHARYA IMPRESSIONS  1. Left ventricular ejection fraction, by estimation, is 45 to 50%. The left ventricle has mildly decreased function. The left ventricle demonstrates regional wall motion abnormalities (see scoring diagram/findings for description). Left ventricular diastolic parameters are consistent with Grade I diastolic dysfunction (impaired relaxation). Dykinesis and hypokinesis of the mid to apical lateral and anteroseptal walls.  2. Right ventricular systolic function is normal. The right ventricular size is normal. Tricuspid regurgitation signal is inadequate for assessing PA pressure.  3. Left atrial size was mildly dilated.  4. The mitral valve is normal in structure. Mild mitral valve regurgitation. No evidence of mitral stenosis.  5. The aortic valve is normal in structure. Aortic valve regurgitation is trivial. No aortic stenosis is present.  6. Aortic dilatation noted. There is mild dilatation of the aortic root, measuring 41 mm.  7. The inferior vena cava is normal in size with greater than 50% respiratory variability, suggesting right atrial pressure of 3 mmHg. FINDINGS  Left Ventricle: Left ventricular ejection fraction, by estimation, is 45 to 50%. The left ventricle has mildly decreased function. The left ventricle demonstrates regional wall motion abnormalities. Definity contrast agent was given IV to delineate the left ventricular endocardial borders. The left ventricular internal cavity size was normal in size. There is no left  ventricular hypertrophy. Left ventricular diastolic parameters are consistent with Grade I diastolic dysfunction (impaired relaxation).  LV Wall Scoring: Dykinesis and hypokinesis of the mid to apical lateral and anteroseptal walls. Right Ventricle: The right ventricular size is normal. No increase in right ventricular wall thickness. Right ventricular systolic function is normal. Tricuspid regurgitation signal is inadequate for assessing PA pressure. Left Atrium: Left atrial size was mildly dilated. Right Atrium: Right atrial size was normal in size. Pericardium: There is no evidence of pericardial effusion. Mitral Valve: The mitral valve is normal in structure. Mild mitral valve regurgitation. No evidence of mitral valve stenosis. Tricuspid Valve: The tricuspid valve is normal in structure. Tricuspid valve regurgitation is trivial. No evidence of tricuspid stenosis. Aortic Valve: The aortic valve is normal in structure. Aortic valve regurgitation is trivial. No aortic stenosis is present. Aortic valve mean gradient measures 3.0 mmHg. Aortic valve peak gradient measures 5.5 mmHg. Aortic valve area, by VTI measures 3.25 cm. Pulmonic Valve: The pulmonic valve was normal in structure. Pulmonic valve regurgitation is trivial. No evidence of pulmonic stenosis. Aorta: Aortic dilatation noted. There is mild dilatation of the aortic root, measuring 41 mm. Venous: The inferior vena cava is normal in size with greater than 50% respiratory variability, suggesting right atrial pressure of 3 mmHg. IAS/Shunts: No atrial level shunt detected by color flow Doppler.  LEFT VENTRICLE PLAX 2D LVIDd:         4.60 cm  Diastology LVIDs:         3.20 cm  LV e' medial:  7.40 cm/s LV PW:         1.00 cm  LV E/e' medial:  7.9 LV IVS:        1.00 cm  LV e' lateral:   6.42 cm/s LVOT diam:     2.30 cm  LV E/e' lateral: 9.0 LV SV:         85 LV SV Index:   40 LVOT Area:     4.15 cm  RIGHT VENTRICLE RV Basal diam:  3.00 cm LEFT ATRIUM              Index       RIGHT ATRIUM           Index LA diam:        3.10 cm 1.46 cm/m  RA Area:     10.00 cm LA Vol (A2C):   52.9 ml 24.97 ml/m RA Volume:   19.20 ml  9.06 ml/m LA Vol (A4C):   79.2 ml 37.38 ml/m LA Biplane Vol: 70.2 ml 33.13 ml/m  AORTIC VALVE AV Area (Vmax):    3.62 cm AV Area (Vmean):   3.35 cm AV Area (VTI):     3.25 cm AV Vmax:           117.00 cm/s AV Vmean:          76.500 cm/s AV VTI:            0.262 m AV Peak Grad:      5.5 mmHg AV Mean Grad:      3.0 mmHg LVOT Vmax:         102.00 cm/s LVOT Vmean:        61.600 cm/s LVOT VTI:          0.205 m LVOT/AV VTI ratio: 0.78  AORTA Ao Root diam: 4.10 cm Ao Asc diam:  3.20 cm MITRAL VALVE MV Area (PHT): 3.31 cm    SHUNTS MV Decel Time: 229 msec    Systemic VTI:  0.20 m MV E velocity: 58.10 cm/s  Systemic Diam: 2.30 cm MV A velocity: 78.30 cm/s MV E/A ratio:  0.74 Weston Brass MD Electronically signed by Weston Brass MD Signature Date/Time: 02/08/2021/5:14:41 PM    Final     Assessment & Plan:   Problem List Items Addressed This Visit     Low back pain    Recent - resolved after Chiropractic treatment      Relevant Orders   CK   Paresthesia    Stable      Relevant Orders   Vitamin B12   Esophageal stricture    No relapse      Gait disorder    Worse over past 6 months - legs are weaker. Hold statin. Fish oil instead for now. Check CK Use rollator, walker      Relevant Orders   CK   CBC with Differential/Platelet   Comprehensive metabolic panel   Lipase   Urinalysis   Vitamin B12   VITAMIN D 25 Hydroxy (Vit-D Deficiency, Fractures)   TSH   Sedimentation rate   Lipase   Abdominal pain - Primary    New R/o gastritis, PUD vs other Restart Pantoprazole bid Abd [place] D/c Meloxicam Labs w/lipase etc      Relevant Orders   CK   CBC with Differential/Platelet   Comprehensive metabolic panel   Lipase   Urinalysis   Vitamin B12   VITAMIN D 25 Hydroxy (Vit-D Deficiency, Fractures)   TSH   Sedimentation  rate  Lipase   US Abdomen Complete   Other Visit Diagnoses     Vitamin D deficiency       Relevant Orders   VITAMIN D 25 Hydroxy (Vit-D Deficiency, Fractures)         Meds ordered this encounter  Medications  . pantoprazole (PROTONIX) 40 MG tablet    Sig: Take 1 tablet (40 mg total) by mouth 2 (two) times daily.    Dispense:  60 tablet    Refill:  5    NEEDS APPT FOR FURTHER REFILLS      Follow-up: Return in about 4 weeks (around 03/19/2023) for a follow-up visit.  Sonda Primes, MD

## 2023-02-19 NOTE — Assessment & Plan Note (Addendum)
New R/o gastritis, PUD vs other Restart Pantoprazole bid Abd [place] D/c Meloxicam Labs w/lipase etc

## 2023-02-22 ENCOUNTER — Ambulatory Visit
Admission: RE | Admit: 2023-02-22 | Discharge: 2023-02-22 | Disposition: A | Discharge status: Home / Self Care | Payer: Medicare Other | Source: Ambulatory Visit | Attending: Internal Medicine | Admitting: Internal Medicine

## 2023-02-22 DIAGNOSIS — R634 Abnormal weight loss: Secondary | ICD-10-CM | POA: Diagnosis not present

## 2023-02-22 DIAGNOSIS — R1013 Epigastric pain: Secondary | ICD-10-CM | POA: Diagnosis not present

## 2023-02-27 ENCOUNTER — Encounter: Payer: Self-pay | Admitting: Internal Medicine

## 2023-03-05 ENCOUNTER — Ambulatory Visit: Payer: Medicare Other | Admitting: Internal Medicine

## 2023-03-12 ENCOUNTER — Ambulatory Visit: Payer: Medicare Other | Admitting: Adult Health

## 2023-03-29 ENCOUNTER — Other Ambulatory Visit: Payer: Self-pay | Admitting: Internal Medicine

## 2023-05-02 ENCOUNTER — Other Ambulatory Visit: Payer: Self-pay | Admitting: Internal Medicine

## 2023-05-17 ENCOUNTER — Other Ambulatory Visit: Payer: Self-pay | Admitting: Internal Medicine

## 2023-06-27 ENCOUNTER — Other Ambulatory Visit: Payer: Self-pay | Admitting: Internal Medicine

## 2023-07-03 DIAGNOSIS — Z20822 Contact with and (suspected) exposure to covid-19: Secondary | ICD-10-CM | POA: Diagnosis not present

## 2023-07-03 DIAGNOSIS — R519 Headache, unspecified: Secondary | ICD-10-CM | POA: Diagnosis not present

## 2023-07-03 DIAGNOSIS — J09X2 Influenza due to identified novel influenza A virus with other respiratory manifestations: Secondary | ICD-10-CM | POA: Diagnosis not present

## 2023-07-03 DIAGNOSIS — I10 Essential (primary) hypertension: Secondary | ICD-10-CM | POA: Diagnosis not present

## 2023-08-14 ENCOUNTER — Other Ambulatory Visit: Payer: Self-pay | Admitting: Internal Medicine

## 2023-08-22 ENCOUNTER — Encounter: Payer: Self-pay | Admitting: Internal Medicine

## 2023-08-22 ENCOUNTER — Ambulatory Visit (INDEPENDENT_AMBULATORY_CARE_PROVIDER_SITE_OTHER): Admitting: Internal Medicine

## 2023-08-22 VITALS — BP 158/100 | HR 63 | Temp 98.0°F | Ht 70.0 in | Wt 193.6 lb

## 2023-08-22 DIAGNOSIS — I2510 Atherosclerotic heart disease of native coronary artery without angina pectoris: Secondary | ICD-10-CM | POA: Diagnosis not present

## 2023-08-22 DIAGNOSIS — I739 Peripheral vascular disease, unspecified: Secondary | ICD-10-CM

## 2023-08-22 DIAGNOSIS — G8929 Other chronic pain: Secondary | ICD-10-CM | POA: Diagnosis not present

## 2023-08-22 DIAGNOSIS — K209 Esophagitis, unspecified without bleeding: Secondary | ICD-10-CM

## 2023-08-22 DIAGNOSIS — R269 Unspecified abnormalities of gait and mobility: Secondary | ICD-10-CM

## 2023-08-22 DIAGNOSIS — Z789 Other specified health status: Secondary | ICD-10-CM | POA: Insufficient documentation

## 2023-08-22 DIAGNOSIS — K2270 Barrett's esophagus without dysplasia: Secondary | ICD-10-CM

## 2023-08-22 DIAGNOSIS — I10 Essential (primary) hypertension: Secondary | ICD-10-CM | POA: Diagnosis not present

## 2023-08-22 DIAGNOSIS — M545 Low back pain, unspecified: Secondary | ICD-10-CM | POA: Diagnosis not present

## 2023-08-22 DIAGNOSIS — I2583 Coronary atherosclerosis due to lipid rich plaque: Secondary | ICD-10-CM

## 2023-08-22 LAB — COMPREHENSIVE METABOLIC PANEL WITH GFR
ALT: 17 U/L (ref 0–53)
AST: 27 U/L (ref 0–37)
Albumin: 3.9 g/dL (ref 3.5–5.2)
Alkaline Phosphatase: 100 U/L (ref 39–117)
BUN: 15 mg/dL (ref 6–23)
CO2: 29 meq/L (ref 19–32)
Calcium: 9.1 mg/dL (ref 8.4–10.5)
Chloride: 104 meq/L (ref 96–112)
Creatinine, Ser: 0.85 mg/dL (ref 0.40–1.50)
GFR: 80.12 mL/min (ref >60.00)
Glucose, Bld: 84 mg/dL (ref 70–99)
Potassium: 4 meq/L (ref 3.5–5.1)
Sodium: 140 meq/L (ref 135–145)
Total Bilirubin: 0.5 mg/dL (ref 0.2–1.2)
Total Protein: 7.1 g/dL (ref 6.0–8.3)

## 2023-08-22 LAB — CBC WITH DIFFERENTIAL/PLATELET
Basophils Absolute: 0.1 10*3/uL (ref 0.0–0.1)
Basophils Relative: 1.2 % (ref 0.0–3.0)
Eosinophils Absolute: 0.3 10*3/uL (ref 0.0–0.7)
Eosinophils Relative: 4.2 % (ref 0.0–5.0)
HCT: 41.8 % (ref 39.0–52.0)
Hemoglobin: 14.5 g/dL (ref 13.0–17.0)
Lymphocytes Relative: 24 % (ref 12.0–46.0)
Lymphs Abs: 1.4 10*3/uL (ref 0.7–4.0)
MCHC: 34.7 g/dL (ref 30.0–36.0)
MCV: 92.9 fl (ref 78.0–100.0)
Monocytes Absolute: 0.5 10*3/uL (ref 0.1–1.0)
Monocytes Relative: 9.1 % (ref 3.0–12.0)
Neutro Abs: 3.7 10*3/uL (ref 1.4–7.7)
Neutrophils Relative %: 61.5 % (ref 43.0–77.0)
Platelets: 437 10*3/uL — ABNORMAL HIGH (ref 150.0–400.0)
RBC: 4.5 Mil/uL (ref 4.22–5.81)
RDW: 13.6 % (ref 11.5–15.5)
WBC: 6 10*3/uL (ref 4.0–10.5)

## 2023-08-22 LAB — CK: Total CK: 259 U/L — ABNORMAL HIGH (ref 7–232)

## 2023-08-22 NOTE — Assessment & Plan Note (Addendum)
 Resolved after Chiropractic treatment. Doing well Leg weakness - much better off statins

## 2023-08-22 NOTE — Assessment & Plan Note (Addendum)
 Better after wt loss on diet and off the statin Leg weakness - much better off statins

## 2023-08-22 NOTE — Assessment & Plan Note (Addendum)
 Dr Annamaria Helling Off Crestor - much better CT angio IMPRESSION: 1. CT FFR analysis showed hemodynamically significant stenosis in the mid LAD or the proximal ramus intermedius.   2. 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 The vessel is small caliber in this region.    Statin intolerant

## 2023-08-22 NOTE — Assessment & Plan Note (Signed)
 Cont on Protonix 80 mg/d

## 2023-08-22 NOTE — Progress Notes (Signed)
 Subjective:  Patient ID: Mario Huerta, male    DOB: [birth year]  Age: 83 y.o. MRN: [id number]  CC: GI Problem (Currently taking pantoprazole. Has stopped the statin. Walking has gotten a lot better and patient is only having to utilize cane now instead of walker)   HPI Mario Huerta presents for GERD, BPH, leg weakness - much better off statins  Outpatient Medications Prior to Visit  Medication Sig Dispense Refill   amLODipine (NORVASC) 5 MG tablet TAKE 1 TABLET BY MOUTH DAILY 90 tablet 1   APPLE CIDER VINEGAR PO Take by mouth.     aspirin EC 81 MG tablet Take 81 mg by mouth daily.     Cholecalciferol (VITAMIN D3) 50 MCG (2000 UT) capsule Take 1 capsule (2,000 Units total) by mouth daily. 100 capsule 3   finasteride (PROSCAR) 5 MG tablet TAKE 1 TABLET(5 MG) BY MOUTH DAILY 90 tablet 3   Glucosamine HCl (GLUCOSAMINE PO) Take by mouth.     Glucosamine-Chondroitin-MSM (TRIPLE FLEX PO) Take 1 tablet by mouth daily.     lisinopril-hydrochlorothiazide (ZESTORETIC) 20-12.5 MG tablet TAKE 1 TABLET BY MOUTH EVERY DAY 90 tablet 1   meloxicam (MOBIC) 7.5 MG tablet TAKE 1 TO 2 TABLETS(7.5 TO 15 MG) BY MOUTH DAILY AS NEEDED FOR PAIN 60 tablet 5   metoprolol succinate (TOPROL-XL) 25 MG 24 hr tablet Take 1 tablet (25 mg total) by mouth daily. Take with or immediately following a meal. 90 tablet 3   Multiple Vitamins-Minerals (CENTRUM SILVER 50+MEN) TABS Take 1 tablet by mouth daily with breakfast.     Omega-3 Fatty Acids (FISH OIL) 1000 MG CAPS Take 1,000 mg by mouth daily.     pantoprazole (PROTONIX) 40 MG tablet TAKE 1 TABLET(40 MG) BY MOUTH TWICE DAILY 60 tablet 5   VITAMIN E PO Take 1 capsule by mouth daily.     rosuvastatin (CRESTOR) 10 MG tablet Take 1 tablet (10 mg total) by mouth daily. (Patient not taking: Reported on 08/22/2023) 90 tablet 3   No facility-administered medications prior to visit.    ROS: Review of Systems  Constitutional:  Negative for appetite change, fatigue and  unexpected weight change.  HENT:  Negative for congestion, nosebleeds, sneezing, sore throat and trouble swallowing.   Eyes:  Negative for itching and visual disturbance.  Respiratory:  Negative for cough.   Cardiovascular:  Negative for chest pain, palpitations and leg swelling.  Gastrointestinal:  Negative for abdominal distention, blood in stool, diarrhea and nausea.  Genitourinary:  Negative for frequency and hematuria.  Musculoskeletal:  Positive for arthralgias, back pain and gait problem. Negative for joint swelling and neck pain.  Skin:  Negative for rash.  Neurological:  Negative for dizziness, tremors, speech difficulty and weakness.  Psychiatric/Behavioral:  Negative for agitation, dysphoric mood, sleep disturbance and suicidal ideas. The patient is not nervous/anxious.     Objective:  BP (!) 158/100   Pulse 63   Temp 98 F (36.7 C)   Ht 5\' 10"  (1.778 m)   Wt 193 lb 9.6 oz (87.8 kg)   SpO2 98%   BMI 27.78 kg/m   BP Readings from Last 3 Encounters:  08/22/23 (!) 158/100  02/19/23 120/70  11/08/22 138/88    Wt Readings from Last 3 Encounters:  08/22/23 193 lb 9.6 oz (87.8 kg)  02/19/23 188 lb (85.3 kg)  01/03/23 205 lb (93 kg)    Physical Exam Constitutional:      General: He is not in acute distress.  Appearance: He is well-developed.     Comments: NAD  Eyes:     Conjunctiva/sclera: Conjunctivae normal.     Pupils: Pupils are equal, round, and reactive to light.  Neck:     Thyroid: No thyromegaly.     Vascular: No JVD.  Cardiovascular:     Rate and Rhythm: Normal rate and regular rhythm.     Heart sounds: Normal heart sounds. No murmur heard.    No friction rub. No gallop.  Pulmonary:     Effort: Pulmonary effort is normal. No respiratory distress.     Breath sounds: Normal breath sounds. No wheezing or rales.  Chest:     Chest wall: No tenderness.  Abdominal:     General: Bowel sounds are normal. There is no distension.     Palpations: Abdomen is  soft. There is no mass.     Tenderness: There is no abdominal tenderness. There is no guarding or rebound.  Musculoskeletal:        General: No tenderness. Normal range of motion.     Cervical back: Normal range of motion.  Lymphadenopathy:     Cervical: No cervical adenopathy.  Skin:    General: Skin is warm and dry.     Findings: No rash.  Neurological:     Mental Status: He is alert and oriented to person, place, and time.     Cranial Nerves: No cranial nerve deficit.     Motor: No abnormal muscle tone.     Coordination: Coordination normal.     Gait: Gait normal.     Deep Tendon Reflexes: Reflexes are normal and symmetric.  Psychiatric:        Behavior: Behavior normal.        Thought Content: Thought content normal.        Judgment: Judgment normal.   Walking better! Using a cane   Lab Results  Component Value Date   WBC 7.9 02/19/2023   HGB 12.8 (L) 02/19/2023   HCT 37.8 (L) 02/19/2023   PLT 420.0 (H) 02/19/2023   GLUCOSE 87 02/19/2023   CHOL 93 11/08/2022   TRIG 119.0 11/08/2022   HDL 45.50 11/08/2022   LDLCALC 24 11/08/2022   ALT 26 02/19/2023   AST 37 02/19/2023   NA 141 02/19/2023   K 3.7 02/19/2023   CL 104 02/19/2023   CREATININE 0.79 02/19/2023   BUN 16 02/19/2023   CO2 27 02/19/2023   TSH 1.89 02/19/2023   PSA 1.44 11/08/2022    US Abdomen Complete Result Date: 03/06/2023 CLINICAL DATA:  Epigastric abdominal pain, back pain and weight loss. EXAM: ABDOMEN ULTRASOUND COMPLETE COMPARISON:  None Available. FINDINGS: Gallbladder: No gallstones or wall thickening visualized. No sonographic Murphy sign noted by sonographer. Common bile duct: Diameter: 4 mm Liver: No focal lesion identified. Within normal limits in parenchymal echogenicity. Portal vein is patent on color Doppler imaging with normal direction of blood flow towards the liver. IVC: No abnormality visualized. Pancreas: Visualized portion unremarkable. Spleen: Size and appearance within normal  limits. Right Kidney: Length: 9.9 cm. Echogenicity within normal limits. No mass or hydronephrosis visualized. Left Kidney: Length: 11.6 cm. Echogenicity within normal limits. No mass or hydronephrosis visualized. Abdominal aorta: Atherosclerosis visualized without evidence of abdominal aortic aneurysm. Other findings: None. IMPRESSION: 1. No evidence of cholelithiasis or biliary obstruction. 2. 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 Electronically Signed   By: Irish Lack M.D.   On: 03/06/2023 16:20    Assessment &  Plan:   Problem List Items Addressed This Visit     Low back pain   Resolved after Chiropractic treatment. Doing well Leg weakness - much better off statins      Barrett's esophagus with esophagitis   Cont on Protonix 80 mg/d      Essential hypertension   Pt lost wt Tommy stopped his BP meds 2 wks ago Ok to d/c Metoprolol Cont w/other BP meds      Relevant Orders   CBC with Differential/Platelet   Comprehensive metabolic panel   TSH   T4, free   CK   Claudication (HCC) - Primary   Better after wt loss on diet and off the statin Leg weakness - much better off statins      Relevant Orders   CBC with Differential/Platelet   Comprehensive metabolic panel   TSH   T4, free   CK   CAD (coronary artery disease)   Dr Annamaria Helling Off Crestor - much better CT angio IMPRESSION: 1. CT FFR analysis showed hemodynamically significant stenosis in the mid LAD or the proximal ramus intermedius.   2. 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 The vessel is small caliber in this region.    Statin intolerant      Gait disorder   Off Crestor - much better      Relevant Orders   CBC with Differential/Platelet   Comprehensive metabolic panel   TSH   T4, free   CK   Statin intolerance   On Fish oil Leg weakness - much better off statins          No orders of the defined types were placed in this encounter.     Follow-up: Return in about 6 months (around 02/22/2024).  Sonda Primes, MD

## 2023-08-22 NOTE — Assessment & Plan Note (Signed)
Pt lost wt Mario Huerta stopped his BP meds 2 wks ago Ok to d/c Metoprolol Cont w/other BP meds

## 2023-08-22 NOTE — Assessment & Plan Note (Signed)
 Off Crestor - much better

## 2023-08-22 NOTE — Assessment & Plan Note (Addendum)
 On Fish oil Leg weakness - much better off statins

## 2023-08-23 LAB — TSH: TSH: 1.94 u[IU]/mL (ref 0.35–5.50)

## 2023-08-23 LAB — T4, FREE: Free T4: 0.95 ng/dL (ref 0.60–1.60)

## 2023-08-24 ENCOUNTER — Encounter: Payer: Self-pay | Admitting: Internal Medicine

## 2023-09-27 ENCOUNTER — Other Ambulatory Visit: Payer: Self-pay | Admitting: Internal Medicine

## 2023-10-23 DIAGNOSIS — K080 Exfoliation of teeth due to systemic causes: Secondary | ICD-10-CM | POA: Diagnosis not present

## 2024-01-07 ENCOUNTER — Ambulatory Visit: Payer: Medicare Other

## 2024-01-07 VITALS — Ht 70.0 in | Wt 193.0 lb

## 2024-01-07 DIAGNOSIS — Z0000 Encounter for general adult medical examination without abnormal findings: Secondary | ICD-10-CM | POA: Diagnosis not present

## 2024-01-07 NOTE — Progress Notes (Addendum)
 Subjective:   Mario Huerta is a 83 y.o. who presents for a Medicare Wellness preventive visit.  As a reminder, Annual Wellness Visits don't include a physical exam, and some assessments may be limited, especially if this visit is performed virtually. We may recommend an in-person follow-up visit with your provider if needed.  Visit Complete: Virtual I connected with  [name] on 01/07/24 by a audio enabled telemedicine application and verified that I am speaking with the correct person using two identifiers.  Patient Location: Home  Provider Location: Office/Clinic  I discussed the limitations of evaluation and management by telemedicine. The patient expressed understanding and agreed to proceed.  Vital Signs: Because this visit was a virtual/telehealth visit, some criteria may be missing or patient reported. Any vitals not documented were not able to be obtained and vitals that have been documented are patient reported.  VideoDeclined- This patient declined Librarian, academic. Therefore the visit was completed with audio only.  Persons Participating in Visit: Patient.  AWV Questionnaire: No: Patient Medicare AWV questionnaire was not completed prior to this visit.  Cardiac Risk Factors include: advanced age (>42men, >68 women);hypertension;male gender     Objective:    Today's Vitals   01/07/24 1002  Weight: 193 lb (87.5 kg)  Height: 5' 10 (1.778 m)   Body mass index is 27.69 kg/m.     01/07/2024   10:16 AM 01/03/2023   10:18 AM 01/03/2022    1:53 PM 01/24/2021   11:19 AM 11/30/2020   11:04 AM 10/08/2019    9:07 AM 10/07/2019    9:03 AM  Advanced Directives  Does Patient Have a Medical Advance Directive? Yes Yes Yes No Yes Yes Yes  Type of Estate agent of [place];Living will Healthcare Power of [place];Living will Living will;Healthcare Power of Attorney  Living will;Healthcare Power of [employer] Power  of [place];Living will  Does patient want to make changes to medical advance directive?   No - Patient declined  No - Patient declined  No - Patient declined  Copy of Healthcare Power of Attorney in Chart? No - copy requested No - copy requested No - copy requested  No - copy requested  No - copy requested  Would patient like information on creating a medical advance directive?    No - Patient declined       Current Medications (verified) Outpatient Encounter Medications as of 01/07/2024  Medication Sig   amLODipine  (NORVASC ) 5 MG tablet TAKE 1 TABLET BY MOUTH DAILY   APPLE CIDER VINEGAR PO Take by mouth.   aspirin EC 81 MG tablet Take 81 mg by mouth daily.   Cholecalciferol (VITAMIN D3) 50 MCG (2000 UT) capsule Take 1 capsule (2,000 Units total) by mouth daily.   finasteride  (PROSCAR ) 5 MG tablet TAKE 1 TABLET(5 MG) BY MOUTH DAILY   Glucosamine HCl (GLUCOSAMINE PO) Take by mouth.   Glucosamine-Chondroitin-MSM (TRIPLE FLEX PO) Take 1 tablet by mouth daily.   lisinopril -hydrochlorothiazide  (ZESTORETIC ) 20-12.5 MG tablet TAKE 1 TABLET BY MOUTH EVERY DAY   meloxicam  (MOBIC ) 7.5 MG tablet TAKE 1 TO 2 TABLETS(7.5 TO 15 MG) BY MOUTH DAILY AS NEEDED FOR PAIN   metoprolol  succinate (TOPROL -XL) 25 MG 24 hr tablet Take 1 tablet (25 mg total) by mouth daily. Take with or immediately following a meal.   Multiple Vitamins-Minerals (CENTRUM SILVER 50+MEN) TABS Take 1 tablet by mouth daily with breakfast.   Omega-3 Fatty Acids (FISH OIL) 1000 MG CAPS  Take 1,000 mg by mouth daily.   pantoprazole  (PROTONIX ) 40 MG tablet TAKE 1 TABLET(40 MG) BY MOUTH TWICE DAILY   VITAMIN E PO Take 1 capsule by mouth daily.   rosuvastatin  (CRESTOR ) 10 MG tablet Take 1 tablet (10 mg total) by mouth daily. (Patient not taking: Reported on 01/07/2024)   No facility-administered encounter medications on file as of 01/07/2024.    Allergies (verified) Codeine, Gabapentin , Hydrocodone, Oxycodone, and Tramadol   History: Past  Medical History:  Diagnosis Date   Arthritis    Food impaction of esophagus    Gastric ulcer    Hypertension    Past Surgical History:  Procedure Laterality Date   BIOPSY  01/31/2018   Procedure: BIOPSY;  Surgeon: Saintclair Jasper, MD;  Location: [facility] ENDOSCOPY;  Service: Gastroenterology;;   ESOPHAGOGASTRODUODENOSCOPY N/A 01/31/2018   Procedure: ESOPHAGOGASTRODUODENOSCOPY (EGD);  Surgeon: Saintclair Jasper, MD;  Location: [facility] ENDOSCOPY;  Service: Gastroenterology;  Laterality: N/A;   FOREIGN BODY REMOVAL  01/31/2018   Procedure: FOREIGN BODY REMOVAL;  Surgeon: Saintclair Jasper, MD;  Location: [facility] ENDOSCOPY;  Service: Gastroenterology;;   HERNIA REPAIR     MOUTH SURGERY  2019   TOTAL HIP ARTHROPLASTY Right    Family History  Problem Relation Age of Onset   Colon cancer Neg Hx    Esophageal cancer Neg Hx    Pancreatic cancer Neg Hx    Stomach cancer Neg Hx    Liver disease Neg Hx    Social History   Socioeconomic History   Marital status: Widowed    Spouse name: Not on file   Number of children: Not on file   Years of education: Not on file   Highest education level: Some college, no degree  Occupational History   Not on file  Tobacco Use   Smoking status: Never   Smokeless tobacco: Never  Vaping Use   Vaping status: Never Used  Substance and Sexual Activity   Alcohol use: Yes    Comment: occasional   Drug use: Never   Sexual activity: Not Currently    Partners: Female  Other Topics Concern   Not on file  Social History Narrative   Right handed   One story home   Drinks caffeine coffee qam   Social Drivers of Health   Financial Resource Strain: Low Risk  (01/07/2024)   Overall Financial Resource Strain (CARDIA)    Difficulty of Paying Living Expenses: Not hard at all  Food Insecurity: No Food Insecurity (01/07/2024)   Hunger Vital Sign    Worried About Running Out of Food in the Last Year: Never true    Ran Out of Food in the Last Year: Never true  Transportation Needs: No  Transportation Needs (01/07/2024)   PRAPARE - Administrator, Civil Service (Medical): No    Lack of Transportation (Non-Medical): No  Physical Activity: Sufficiently Active (01/07/2024)   Exercise Vital Sign    Days of Exercise per Week: 5 days    Minutes of Exercise per Session: 40 min  Stress: No Stress Concern Present (01/07/2024)   [employer] of Occupational Health - Occupational Stress Questionnaire    Feeling of Stress: Not at all  Social Connections: Moderately Isolated (01/07/2024)   Social Connection and Isolation Panel    Frequency of Communication with Friends and Family: More than three times a week    Frequency of Social Gatherings with Friends and Family: Twice a week    Attends Religious Services: Never    Active  Member of Clubs or Organizations: Yes    Attends Banker Meetings: 1 to 4 times per year    Marital Status: Widowed    Tobacco Counseling Counseling given: No    Clinical Intake:  Pre-visit preparation completed: Yes  Pain : No/denies pain     BMI - recorded: 27.69 Nutritional Status: BMI 25 -29 Overweight Nutritional Risks: None Diabetes: No  No results found for: HGBA1C   How often do you need to have someone help you when you read instructions, pamphlets, or other written materials from your doctor or pharmacy?: 1 - Never  Interpreter Needed?: No  Information entered by :: Verdie Saba, CMA   Activities of Daily Living     01/07/2024   10:05 AM  In your present state of health, do you have any difficulty performing the following activities:  Hearing? 0  Vision? 0  Difficulty concentrating or making decisions? 0  Walking or climbing stairs? 0  Dressing or bathing? 0  Doing errands, shopping? 0  Preparing Food and eating ? N  Using the Toilet? N  In the past six months, have you accidently leaked urine? N  Do you have problems with loss of bowel control? N  Managing your Medications? N  Managing  your Finances? N  Housekeeping or managing your Housekeeping? N    Patient Care Team: Plotnikov, Karlynn GAILS, MD as PCP - General (Internal Medicine) Loni Soyla LABOR, MD as PCP - Cardiology (Cardiology) Skeet Juliene SAUNDERS, DO as Consulting Physician (Neurology)  I have updated your Care Teams any recent Medical Services you may have received from other providers in the past year.     Assessment:   This is a routine wellness examination for Delfino.  Hearing/Vision screen Hearing Screening - Comments:: Denies hearing difficulties   Vision Screening - Comments:: Wears rx glasses - pt is seen at [facility]   Goals Addressed               This Visit's Progress     Patient Stated (pt-stated)        Patient stated he plans to stay busy - keep working on cars       Depression Screen     01/07/2024   10:05 AM 02/19/2023   11:09 AM 01/03/2023   10:19 AM 11/08/2022    8:17 AM 01/03/2022    1:41 PM 11/30/2020   11:03 AM 10/08/2019    9:07 AM  PHQ 2/9 Scores  PHQ - 2 Score 0 0 0 0 0 0 0  PHQ- 9 Score 0 0 0        Fall Risk     01/07/2024   10:05 AM 08/22/2023    8:09 AM 02/19/2023   11:09 AM 01/03/2023   10:19 AM 11/08/2022    8:17 AM  Fall Risk   Falls in the past year? 0 0 0 0 0  Number falls in past yr: 0 0 0 0 0  Injury with Fall? 0 0 0 0 0  Risk for fall due to : No Fall Risks;Impaired balance/gait No Fall Risks No Fall Risks No Fall Risks No Fall Risks  Risk for fall due to: Comment     cane  Follow up Falls evaluation completed;Falls prevention discussed Falls evaluation completed Falls evaluation completed Falls prevention discussed Falls evaluation completed    MEDICARE RISK AT HOME:  Medicare Risk at Home Any stairs in or around the home?: No If so, are there any  without handrails?: No Home free of loose throw rugs in walkways, pet beds, electrical cords, etc?: Yes Adequate lighting in your home to reduce risk of falls?: Yes Life alert?: No Use of a cane, walker or  w/c?: Yes (cane) Grab bars in the bathroom?: Yes Shower chair or bench in shower?: Yes Elevated toilet seat or a handicapped toilet?: No  TIMED UP AND GO:  Was the test performed?  No  Cognitive Function: 6CIT completed        01/07/2024   10:08 AM 01/03/2023   10:20 AM 01/03/2022    1:56 PM 10/07/2019    9:05 AM  6CIT Screen  What Year? 0 points 0 points 0 points 0 points  What month? 0 points 0 points 0 points 0 points  What time? 0 points 0 points 0 points 0 points  Count back from 20 0 points 0 points 0 points 0 points  Months in reverse 0 points 0 points 0 points 0 points  Repeat phrase 0 points 0 points 0 points 0 points  Total Score 0 points 0 points 0 points 0 points    Immunizations Immunization History  Administered Date(s) Administered   Tdap 06/17/2018    Screening Tests Health Maintenance  Topic Date Due   Pneumococcal Vaccine: 50+ Years (1 of 2 - PCV) Never done   INFLUENZA VACCINE  12/28/2023   Medicare Annual Wellness (AWV)  01/06/2025   DTaP/Tdap/Td (2 - Td or Tdap) 06/17/2028   Hepatitis B Vaccines  Aged Out   HPV VACCINES  Aged Out   Meningococcal B Vaccine  Aged Out   COVID-19 Vaccine  Discontinued   Zoster Vaccines- Shingrix  Discontinued    Health Maintenance  Health Maintenance Due  Topic Date Due   Pneumococcal Vaccine: 50+ Years (1 of 2 - PCV) Never done   INFLUENZA VACCINE  12/28/2023   Health Maintenance Items Addressed: I have recommended that this patient have a immunization for Pneumonia but he declines at this time. I have discussed the risks and benefits of this procedure with him. The patient verbalizes understanding.   Additional Screening:  Vision Screening: Recommended annual ophthalmology exams for early detection of glaucoma and other disorders of the eye. Would you like a referral to an eye doctor? No    Dental Screening: Recommended annual dental exams for proper oral hygiene  Community Resource Referral / Chronic Care  Management: CRR required this visit?  No   CCM required this visit?  No   Plan:    I have personally reviewed and noted the following in the patient's chart:   Medical and social history Use of alcohol, tobacco or illicit drugs  Current medications and supplements including opioid prescriptions. Patient is not currently taking opioid prescriptions. Functional ability and status Nutritional status Physical activity Advanced directives List of other physicians Hospitalizations, surgeries, and ER visits in previous 12 months Vitals Screenings to include cognitive, depression, and falls Referrals and appointments  In addition, I have reviewed and discussed with patient certain preventive protocols, quality metrics, and best practice recommendations. A written personalized care plan for preventive services as well as general preventive health recommendations were provided to patient.   Verdie CHRISTELLA Saba, CMA   01/07/2024   After Visit Summary: (MyChart) Due to this being a telephonic visit, the after visit summary with patients personalized plan was offered to patient via MyChart    Medical screening examination/treatment/procedure(s) were performed by non-physician practitioner and as supervising physician I was immediately available  for consultation/collaboration.  I agree with above. Karlynn Noel, MD  Notes: Scheduled a 1-yr CPE w/PCP for 01/2024.

## 2024-01-07 NOTE — Patient Instructions (Addendum)
 Mr. Mays , Thank you for taking time out of your busy schedule to complete your Annual Wellness Visit with me. I enjoyed our conversation and look forward to speaking with you again next year. I, as well as your care team,  appreciate your ongoing commitment to your health goals. Please review the following plan we discussed and let me know if I can assist you in the future. Your Game plan/ To Do List    Referrals: If you haven't heard from the office you've been referred to, please reach out to them at the phone provided.   Follow up Visits: We will see or speak with you next year for your Next Medicare AWV with our clinical staff Have you seen your provider in the last 6 months (3 months if uncontrolled diabetes)? Yes  Clinician Recommendations:  Aim for 30 minutes of exercise or brisk walking, 6-8 glasses of water, and 5 servings of fruits and vegetables each day. Educated and advised on getting the Pneumonia vaccine in 2025.      This is a list of the screenings recommended for you:  Health Maintenance  Topic Date Due   Pneumococcal Vaccine for age over 68 (1 of 2 - PCV) Never done   Flu Shot  12/28/2023   Medicare Annual Wellness Visit  01/06/2025   DTaP/Tdap/Td vaccine (2 - Td or Tdap) 06/17/2028   Hepatitis B Vaccine  Aged Out   HPV Vaccine  Aged Out   Meningitis B Vaccine  Aged Out   COVID-19 Vaccine  Discontinued   Zoster (Shingles) Vaccine  Discontinued    Advanced directives: (Copy Requested) Please bring a copy of your health care power of attorney and living will to the office to be added to your chart at your convenience. You can mail to [facility] [address] [place], [place] [address] or email to [email or web address] Advance Care Planning is important because it:  [x]  Makes sure you receive the medical care that is consistent with your values, goals, and preferences  [x]  It provides guidance to your family and loved ones and reduces their  decisional burden about whether or not they are making the right decisions based on your wishes.  Follow the link provided in your after visit summary or read over the paperwork we have mailed to you to help you started getting your Advance Directives in place. If you need assistance in completing these, please reach out to us  so that we can help you!

## 2024-01-19 ENCOUNTER — Inpatient Hospital Stay (HOSPITAL_COMMUNITY)
Admission: EM | Admit: 2024-01-19 | Discharge: 2024-01-22 | DRG: 378 | Disposition: A | Discharge status: Home / Self Care | Attending: Internal Medicine | Admitting: Internal Medicine

## 2024-01-19 ENCOUNTER — Emergency Department (HOSPITAL_COMMUNITY)

## 2024-01-19 ENCOUNTER — Encounter (HOSPITAL_COMMUNITY): Payer: Self-pay

## 2024-01-19 ENCOUNTER — Other Ambulatory Visit: Payer: Self-pay

## 2024-01-19 DIAGNOSIS — K2100 Gastro-esophageal reflux disease with esophagitis, without bleeding: Secondary | ICD-10-CM | POA: Diagnosis not present

## 2024-01-19 DIAGNOSIS — K5731 Diverticulosis of large intestine without perforation or abscess with bleeding: Secondary | ICD-10-CM | POA: Diagnosis not present

## 2024-01-19 DIAGNOSIS — I10 Essential (primary) hypertension: Secondary | ICD-10-CM

## 2024-01-19 DIAGNOSIS — Z888 Allergy status to other drugs, medicaments and biological substances status: Secondary | ICD-10-CM | POA: Diagnosis not present

## 2024-01-19 DIAGNOSIS — K449 Diaphragmatic hernia without obstruction or gangrene: Secondary | ICD-10-CM | POA: Diagnosis not present

## 2024-01-19 DIAGNOSIS — K922 Gastrointestinal hemorrhage, unspecified: Secondary | ICD-10-CM | POA: Diagnosis not present

## 2024-01-19 DIAGNOSIS — Z885 Allergy status to narcotic agent status: Secondary | ICD-10-CM | POA: Diagnosis not present

## 2024-01-19 DIAGNOSIS — E876 Hypokalemia: Secondary | ICD-10-CM | POA: Diagnosis present

## 2024-01-19 DIAGNOSIS — I9589 Other hypotension: Secondary | ICD-10-CM | POA: Diagnosis not present

## 2024-01-19 DIAGNOSIS — K2270 Barrett's esophagus without dysplasia: Secondary | ICD-10-CM | POA: Diagnosis not present

## 2024-01-19 DIAGNOSIS — K222 Esophageal obstruction: Secondary | ICD-10-CM | POA: Diagnosis present

## 2024-01-19 DIAGNOSIS — K921 Melena: Secondary | ICD-10-CM | POA: Diagnosis not present

## 2024-01-19 DIAGNOSIS — J45909 Unspecified asthma, uncomplicated: Secondary | ICD-10-CM | POA: Diagnosis not present

## 2024-01-19 DIAGNOSIS — Z8719 Personal history of other diseases of the digestive system: Secondary | ICD-10-CM

## 2024-01-19 DIAGNOSIS — K625 Hemorrhage of anus and rectum: Secondary | ICD-10-CM | POA: Diagnosis not present

## 2024-01-19 DIAGNOSIS — R531 Weakness: Secondary | ICD-10-CM | POA: Diagnosis not present

## 2024-01-19 DIAGNOSIS — E861 Hypovolemia: Secondary | ICD-10-CM | POA: Diagnosis present

## 2024-01-19 DIAGNOSIS — Z79899 Other long term (current) drug therapy: Secondary | ICD-10-CM

## 2024-01-19 DIAGNOSIS — Z7982 Long term (current) use of aspirin: Secondary | ICD-10-CM

## 2024-01-19 DIAGNOSIS — I4891 Unspecified atrial fibrillation: Secondary | ICD-10-CM | POA: Diagnosis present

## 2024-01-19 DIAGNOSIS — Z96641 Presence of right artificial hip joint: Secondary | ICD-10-CM | POA: Diagnosis present

## 2024-01-19 DIAGNOSIS — Z791 Long term (current) use of non-steroidal anti-inflammatories (NSAID): Secondary | ICD-10-CM

## 2024-01-19 DIAGNOSIS — Z8711 Personal history of peptic ulcer disease: Secondary | ICD-10-CM | POA: Diagnosis not present

## 2024-01-19 DIAGNOSIS — K5730 Diverticulosis of large intestine without perforation or abscess without bleeding: Secondary | ICD-10-CM | POA: Diagnosis not present

## 2024-01-19 DIAGNOSIS — D62 Acute posthemorrhagic anemia: Secondary | ICD-10-CM | POA: Diagnosis not present

## 2024-01-19 DIAGNOSIS — I2510 Atherosclerotic heart disease of native coronary artery without angina pectoris: Secondary | ICD-10-CM | POA: Diagnosis present

## 2024-01-19 DIAGNOSIS — I959 Hypotension, unspecified: Secondary | ICD-10-CM | POA: Diagnosis not present

## 2024-01-19 DIAGNOSIS — Q272 Other congenital malformations of renal artery: Secondary | ICD-10-CM | POA: Diagnosis not present

## 2024-01-19 LAB — HEMOGLOBIN AND HEMATOCRIT, BLOOD
HCT: 26 % — ABNORMAL LOW (ref 39.0–52.0)
HCT: 25.5 % — ABNORMAL LOW (ref 39.0–52.0)
HCT: 29.6 % — ABNORMAL LOW (ref 39.0–52.0)
Hemoglobin: 8.6 g/dL — ABNORMAL LOW (ref 13.0–17.0)
Hemoglobin: 8.6 g/dL — ABNORMAL LOW (ref 13.0–17.0)
Hemoglobin: 9.6 g/dL — ABNORMAL LOW (ref 13.0–17.0)

## 2024-01-19 LAB — CBC WITH DIFFERENTIAL/PLATELET
Abs Immature Granulocytes: 0.03 K/uL (ref 0.00–0.07)
Basophils Absolute: 0 K/uL (ref 0.0–0.1)
Basophils Relative: 0 %
Eosinophils Absolute: 0.1 K/uL (ref 0.0–0.5)
Eosinophils Relative: 1 %
HCT: 31.3 % — ABNORMAL LOW (ref 39.0–52.0)
Hemoglobin: 10.2 g/dL — ABNORMAL LOW (ref 13.0–17.0)
Immature Granulocytes: 0 %
Lymphocytes Relative: 16 %
Lymphs Abs: 1.4 K/uL (ref 0.7–4.0)
MCH: 31.1 pg (ref 26.0–34.0)
MCHC: 32.6 g/dL (ref 30.0–36.0)
MCV: 95.4 fL (ref 80.0–100.0)
Monocytes Absolute: 0.5 K/uL (ref 0.1–1.0)
Monocytes Relative: 5 %
Neutro Abs: 7.1 K/uL (ref 1.7–7.7)
Neutrophils Relative %: 78 %
Platelets: 249 K/uL (ref 150–400)
RBC: 3.28 MIL/uL — ABNORMAL LOW (ref 4.22–5.81)
RDW: 13.6 % (ref 11.5–15.5)
WBC: 9.2 K/uL (ref 4.0–10.5)
nRBC: 0 % (ref 0.0–0.2)

## 2024-01-19 LAB — COMPREHENSIVE METABOLIC PANEL WITH GFR
ALT: 19 U/L (ref 0–44)
AST: 30 U/L (ref 15–41)
Albumin: 2.7 g/dL — ABNORMAL LOW (ref 3.5–5.0)
Alkaline Phosphatase: 70 U/L (ref 38–126)
Anion gap: 8 (ref 5–15)
BUN: 48 mg/dL — ABNORMAL HIGH (ref 8–23)
CO2: 23 mmol/L (ref 22–32)
Calcium: 8.2 mg/dL — ABNORMAL LOW (ref 8.9–10.3)
Chloride: 108 mmol/L (ref 98–111)
Creatinine, Ser: 1.13 mg/dL (ref 0.61–1.24)
GFR, Estimated: >60 mL/min (ref >60)
Glucose, Bld: 133 mg/dL — ABNORMAL HIGH (ref 70–99)
Potassium: 3.9 mmol/L (ref 3.5–5.1)
Sodium: 139 mmol/L (ref 135–145)
Total Bilirubin: 0.5 mg/dL (ref 0.0–1.2)
Total Protein: 5.3 g/dL — ABNORMAL LOW (ref 6.5–8.1)

## 2024-01-19 LAB — MRSA NEXT GEN BY PCR, NASAL: MRSA by PCR Next Gen: NOT DETECTED

## 2024-01-19 LAB — PROTIME-INR
INR: 1.2 (ref 0.8–1.2)
Prothrombin Time: 15.9 s — ABNORMAL HIGH (ref 11.4–15.2)

## 2024-01-19 LAB — ABO/RH: ABO/RH(D): O POS

## 2024-01-19 LAB — PREPARE RBC (CROSSMATCH)

## 2024-01-19 MED ORDER — SODIUM CHLORIDE 0.9 % IV SOLN: INTRAVENOUS | Status: AC

## 2024-01-19 MED ORDER — IOHEXOL 350 MG/ML SOLN
100.0000 mL | Freq: Once | INTRAVENOUS | Status: AC | PRN
  Administered 2024-01-19: 100 mL via INTRAVENOUS

## 2024-01-19 MED ORDER — ACETAMINOPHEN 650 MG RE SUPP
650.0000 mg | Freq: Four times a day (QID) | RECTAL | Status: DC | PRN
Start: 2024-01-19 — End: 2024-01-22

## 2024-01-19 MED ORDER — PANTOPRAZOLE SODIUM 40 MG IV SOLR
40.0000 mg | Freq: Once | INTRAVENOUS | Status: DC
  Filled 2024-01-19: qty 10

## 2024-01-19 MED ORDER — SODIUM CHLORIDE 0.9% IV SOLUTION: Freq: Once | INTRAVENOUS | Status: AC

## 2024-01-19 MED ORDER — SODIUM CHLORIDE 0.9 % IV BOLUS
1000.0000 mL | Freq: Once | INTRAVENOUS | Status: AC
  Administered 2024-01-19: 1000 mL via INTRAVENOUS

## 2024-01-19 MED ORDER — ACETAMINOPHEN 325 MG PO TABS
650.0000 mg | ORAL_TABLET | Freq: Four times a day (QID) | ORAL | Status: DC | PRN
Start: 2024-01-19 — End: 2024-01-22

## 2024-01-19 MED ORDER — CHLORHEXIDINE GLUCONATE CLOTH 2 % EX PADS
6.0000 | MEDICATED_PAD | Freq: Every day | CUTANEOUS | Status: DC
  Administered 2024-01-19 – 2024-01-21 (×4): 6 via TOPICAL

## 2024-01-19 MED ORDER — PANTOPRAZOLE SODIUM 40 MG IV SOLR
40.0000 mg | Freq: Two times a day (BID) | INTRAVENOUS | Status: DC
  Administered 2024-01-19 – 2024-01-22 (×7): 40 mg via INTRAVENOUS
  Filled 2024-01-19 (×8): qty 10

## 2024-01-19 MED ORDER — ONDANSETRON HCL 4 MG PO TABS: 4.0000 mg | ORAL_TABLET | Freq: Four times a day (QID) | ORAL | Status: DC | PRN

## 2024-01-19 MED ORDER — ONDANSETRON HCL 4 MG/2ML IJ SOLN: 4.0000 mg | Freq: Four times a day (QID) | INTRAMUSCULAR | Status: DC | PRN

## 2024-01-19 MED ORDER — ORAL CARE MOUTH RINSE
15.0000 mL | OROMUCOSAL | Status: DC | PRN
Start: 2024-01-19 — End: 2024-01-22

## 2024-01-19 NOTE — Consult Note (Signed)
 Consultation  Referring Provider:    [facility] Primary Care Physician:  Garald Karlynn GAILS, MD Primary Gastroenterologist:    [facility], previously Dr. Aneita     Reason for Consultation:     GI bleed            HPI:   Mario Huerta is a 83 y.o. male with diverticulosis who presents with rectal bleeding.  Rectal bleeding - Onset at approximately 2:15 AM today - Three episodes characterized by passage of mostly blood with very little stool - First occurrence of rectal bleeding - No associated pain - No vomiting  Lightheadedness - Felt lightheaded during the bleeding episodes earlier in the morning - Currently no dizziness or lightheadedness  Bowel habits and weight changes - Bowel habits are usually regular - No recent unintentional weight loss - Intentional weight loss from 220 pounds to approximately 197-200 pounds by reducing fruit intake  Medication use - Current medications include fish oil, two antihypertensive agents, and baby aspirin - Baby aspirin taken for forty years - No medications taken this morning  Cardiovascular status - Regular blood pressure monitoring - Blood pressure usually around 130/80 mmHg  Past Medical History:  Diagnosis Date   Arthritis    Food impaction of esophagus    Gastric ulcer    Hypertension     Past Surgical History:  Procedure Laterality Date   BIOPSY  01/31/2018   Procedure: BIOPSY;  Surgeon: Saintclair Jasper, MD;  Location: [facility] ENDOSCOPY;  Service: Gastroenterology;;   ESOPHAGOGASTRODUODENOSCOPY N/A 01/31/2018   Procedure: ESOPHAGOGASTRODUODENOSCOPY (EGD);  Surgeon: Saintclair Jasper, MD;  Location: [facility] ENDOSCOPY;  Service: Gastroenterology;  Laterality: N/A;   FOREIGN BODY REMOVAL  01/31/2018   Procedure: FOREIGN BODY REMOVAL;  Surgeon: Saintclair Jasper, MD;  Location: [facility] ENDOSCOPY;  Service: Gastroenterology;;   HERNIA REPAIR     MOUTH SURGERY  2019   TOTAL HIP ARTHROPLASTY Right     Family History  Problem Relation Age of Onset    Colon cancer Neg Hx    Esophageal cancer Neg Hx    Pancreatic cancer Neg Hx    Stomach cancer Neg Hx    Liver disease Neg Hx      Social History   Tobacco Use   Smoking status: Never   Smokeless tobacco: Never  Vaping Use   Vaping status: Never Used  Substance Use Topics   Alcohol  use: Yes    Comment: occasional   Drug use: Never    Prior to Admission medications   Medication Sig Start Date End Date Taking? Authorizing Provider  amLODipine  (NORVASC ) 5 MG tablet TAKE 1 TABLET BY MOUTH DAILY 03/29/23  Yes Plotnikov, Aleksei V, MD  APPLE CIDER VINEGAR PO Take by mouth.   Yes [provider]  aspirin EC 81 MG tablet Take 81 mg by mouth daily.   Yes [provider]  finasteride  (PROSCAR ) 5 MG tablet TAKE 1 TABLET(5 MG) BY MOUTH DAILY 05/17/23  Yes Plotnikov, Aleksei V, MD  Glucosamine-Chondroitin-MSM (TRIPLE FLEX PO) Take 1 tablet by mouth daily.   Yes [provider]  ibuprofen (ADVIL) 600 MG tablet Take 600 mg by mouth every 6 (six) hours as needed. 11/23/23  Yes [provider]  lisinopril -hydrochlorothiazide  (ZESTORETIC ) 20-12.5 MG tablet TAKE 1 TABLET BY MOUTH EVERY DAY 09/27/23  Yes Plotnikov, Aleksei V, MD  Multiple Vitamins-Minerals (CENTRUM SILVER 50+MEN) TABS Take 1 tablet by mouth daily with breakfast.   Yes [provider]  Omega-3 Fatty Acids (FISH OIL)  1000 MG CAPS Take 1,000 mg by mouth daily.   Yes [provider]  pantoprazole  (PROTONIX ) 40 MG tablet TAKE 1 TABLET(40 MG) BY MOUTH TWICE DAILY 08/14/23  Yes Plotnikov, Aleksei V, MD  VITAMIN E PO Take 1 capsule by mouth daily.   Yes [provider]  meloxicam  (MOBIC ) 7.5 MG tablet TAKE 1 TO 2 TABLETS(7.5 TO 15 MG) BY MOUTH DAILY AS NEEDED FOR PAIN Patient not taking: Reported on 01/19/2024 05/05/23   Plotnikov, Karlynn GAILS, MD    Current Facility-Administered Medications  Medication Dose Route Frequency Provider Last Rate Last Admin   0.9 %  sodium chloride   infusion   Intravenous Continuous Cardama, Pedro Eduardo, MD 125 mL/hr at 01/19/24 0851 New Bag at 01/19/24 0851   acetaminophen  (TYLENOL ) tablet 650 mg  650 mg Oral Q6H PRN Celinda Alm Lot, MD       Or   acetaminophen  (TYLENOL ) suppository 650 mg  650 mg Rectal Q6H PRN Celinda Alm Lot, MD       Chlorhexidine  Gluconate Cloth 2 % PADS 6 each  6 each Topical Daily Celinda Alm Lot, MD   6 each at 01/19/24 1020   ondansetron  (ZOFRAN ) tablet 4 mg  4 mg Oral Q6H PRN Celinda Alm Lot, MD       Or   ondansetron  (ZOFRAN ) injection 4 mg  4 mg Intravenous Q6H PRN Celinda Alm Lot, MD       Oral care mouth rinse  15 mL Mouth Rinse PRN Celinda Alm Lot, MD       pantoprazole  (PROTONIX ) injection 40 mg  40 mg Intravenous Q12H Celinda Alm Lot, MD   40 mg at 01/19/24 1117    Allergies as of 01/19/2024 - Review Complete 01/19/2024  Allergen Reaction Noted   Codeine Itching and Nausea And Vomiting 01/31/2018   Gabapentin  Swelling and Other (See Comments) 01/24/2021   Hydrocodone Nausea And Vomiting 01/31/2018   Oxycodone Nausea And Vomiting 01/31/2018   Tramadol Itching and Nausea And Vomiting 01/31/2018     Review of Systems:    This is positive for those things mentioned in the HPI. All other review of systems are negative.       Physical Exam:  Vital signs in last 24 hours: Temp:  [98 F (36.7 C)-98.2 F (36.8 C)] 98.2 F (36.8 C) (08/23 0848) Pulse Rate:  [64-97] 72 (08/23 1100) Resp:  [16-23] 16 (08/23 1100) BP: (80-127)/(50-79) 118/71 (08/23 1100) SpO2:  [96 %-99 %] 97 % (08/23 1100) Weight:  [87.5 kg] 87.5 kg (08/23 0549)    GENERAL: Alert, cooperative, well developed, no acute distress HEENT: Normocephalic, normal oropharynx, moist mucous membranes CHEST: Clear to auscultation bilaterally, no wheezes, rhonchi, or crackles CARDIOVASCULAR: Normal heart rate and rhythm, S1 and S2 normal without murmurs ABDOMEN: Soft, non-tender, non-distended, without  organomegaly, normal bowel sounds EXTREMITIES: No cyanosis or edema NEUROLOGICAL: Cranial nerves grossly intact, moves all extremities without gross motor or sensory deficit  Data Reviewed:   LAB RESULTS: Recent Labs    01/19/24 0535  WBC 9.2  HGB 10.2*  HCT 31.3*  PLT 249   BMET Recent Labs    01/19/24 0535  NA 139  K 3.9  CL 108  CO2 23  GLUCOSE 133*  BUN 48*  CREATININE 1.13  CALCIUM  8.2*   LFT Recent Labs    01/19/24 0535  PROT 5.3*  ALBUMIN 2.7*  AST 30  ALT 19  ALKPHOS 70  BILITOT 0.5   PT/INR Recent Labs  01/19/24 0535  LABPROT 15.9*  INR 1.2    STUDIES: CT ANGIO GI BLEED Result Date: 01/19/2024 CLINICAL DATA:  Hypotension. Rectal bleeding. Nausea dizziness and generalized weakness. EXAM: CTA ABDOMEN AND PELVIS WITHOUT AND WITH CONTRAST TECHNIQUE: Multidetector CT imaging of the abdomen and pelvis was performed using the standard protocol during bolus administration of intravenous contrast. Multiplanar reconstructed images and MIPs were obtained and reviewed to evaluate the vascular anatomy. RADIATION DOSE REDUCTION: This exam was performed according to the departmental dose-optimization program which includes automated exposure control, adjustment of the mA and/or kV according to patient size and/or use of iterative reconstruction technique. CONTRAST:  OMNIPAQUE  IOHEXOL  350 MG/ML SOLN COMPARISON:  None Available. FINDINGS: VASCULAR Aorta: Moderate atherosclerotic wall calcification.Normal caliber aorta without aneurysm, dissection, vasculitis or significant stenosis. Celiac: Patent without evidence of aneurysm, dissection, vasculitis or significant stenosis. SMA: Patent without evidence of aneurysm, dissection, vasculitis or significant stenosis. Renals: Both renal arteries are patent without evidence of aneurysm, dissection, vasculitis, fibromuscular dysplasia or significant stenosis. Accessory right renal artery. IMA: Patent without evidence of  aneurysm, dissection, vasculitis or significant stenosis.1 Inflow: Patent without evidence of aneurysm, dissection, vasculitis or significant stenosis. Proximal Outflow: Bilateral common femoral and visualized portions of the superficial and profunda femoral arteries are patent without evidence of aneurysm, dissection, vasculitis or significant stenosis. Veins: No obvious venous abnormality within the limitations of this arterial phase study. Review of the MIP images confirms the above findings. NON-VASCULAR Lower chest: Dependent atelectasis noted right lower lobe. Hiatal hernia contains mainly fat. Hepatobiliary: A tiny hypodensity in the liver parenchyma is too small to characterize but is statistically most likely benign. No followup imaging is recommended. No suspicious focal abnormality within the liver parenchyma. There is no evidence for gallstones, gallbladder wall thickening, or pericholecystic fluid. No intrahepatic or extrahepatic biliary dilation. Pancreas: No focal mass lesion. No dilatation of the main duct. No intraparenchymal cyst. No peripancreatic edema. Spleen: No splenomegaly. No suspicious focal mass lesion. Adrenals/Urinary Tract: No adrenal nodule or mass. Tiny well-defined homogeneous low-density lesions in both kidneys are too small to characterize but are statistically most likely benign and probably cysts. No followup imaging is recommended. Mild fullness noted in both intrarenal collecting systems. No hydroureter. The urinary bladder appears normal for the degree of distention. Stomach/Bowel: No evidence for contrast extravasation into the gastric lumen to suggest active hemorrhage. No findings to suggest vascular lesion. Duodenum is normally positioned as is the ligament of Treitz. No contrast extravasation in the duodenal lumen. No small bowel wall thickening. No small bowel dilatation. No intraluminal contrast extravasation in the small bowel loops. Mobile cecum is positioned in the  left lower abdomen. The terminal ileum is normal. The appendix is not well visualized, but there is no edema or inflammation in the region of the cecal tip to suggest appendicitis. No definite evidence for contrast extravasation into the colon lumen to suggest active or ongoing bleeding. There is a focus of high attenuation noted in the splenic flexure (axial 38/15) but this is in a region of substantial motion artifact and no accumulation of contrast material in this region on more delayed imaging to suggest ongoing hemorrhage. Diverticular changes are noted in the left colon without evidence of diverticulitis. Lymphatic: There is no gastrohepatic or hepatoduodenal ligament lymphadenopathy. No retroperitoneal or mesenteric lymphadenopathy. No pelvic sidewall lymphadenopathy. Reproductive: The prostate gland and seminal vesicles are unremarkable. Other: No intraperitoneal free fluid. Musculoskeletal: Right hip replacement1. No worrisome lytic or sclerotic osseous abnormality. IMPRESSION: 1.  No definite evidence for contrast extravasation into the gastric lumen, duodenum, small bowel, or colon lumen to suggest active or ongoing bleeding. 2. No acute findings in the abdomen or pelvis. 3. Left colonic diverticulosis without diverticulitis. 4.  Aortic Atherosclerosis (ICD10-I70.0). Electronically Signed   By: Camellia Candle M.D.   On: 01/19/2024 08:02     PREVIOUS ENDOSCOPIES:            EGD 2019    Impression / Plan:   Acute lower gastrointestinal bleeding likely secondary to diverticular hemorrhage  First episode of acute lower gastrointestinal bleeding likely due to diverticulosis with left-sided colonic diverticula. Bleeding has ceased spontaneously, CT angio negative for active extravasation  Approximately 30-40% of such cases resolve without intervention, while 10% may persist. No current pain, significant stool passage, vomiting, or weight loss, but earlier lightheadedness was noted. Blood pressure  is stable, with a 2-3-unit drop in blood count. - Initiate liquid diet if no further bleeding occurs - Advance to soft diet tomorrow if stable - Monitor for signs of recurrent bleeding, such as lightheadedness or blood pressure drop - Perform additional CT scan or colonoscopy if bleeding recurs - Advise rest and limited activity for today  Essential hypertension Essential hypertension is well-controlled with medication, typically around 130/80 mmHg. Medication may be held due to recent bleeding episode to prevent further blood pressure drop. - Monitor blood pressure closely - Hold antihypertensive medication if necessary due to bleeding  GI will continue to follow along   The patient was provided an opportunity to ask questions and all were answered. The patient agreed with the plan and demonstrated an understanding of the instructions.       LOIS Wilkie Mcgee , MD [phone]

## 2024-01-19 NOTE — H&P (Signed)
 History and Physical    Patient: Mario Huerta FMW:[id number] DOB: Oct 12, 1940 DOA: 01/19/2024 DOS: the patient was seen and examined on 01/19/2024 PCP: Garald Karlynn GAILS, MD  Patient coming from: Home  Chief Complaint:  Chief Complaint  Patient presents with   Rectal Bleeding   HPI: Mario Huerta is a 83 y.o. male with medical history significant of osteoarthritis, food impaction of the esophagus, Barrett's esophagus with esophagitis, esophageal stricture, gastric ulcer, CAD, hypertension, bladder neck obstruction, peripheral neuropathy, presented to the emergency department with history of multiple episodes of dark red loose stools associated with dizziness, generalized weakness and nausea.  He was initially hypotensive when he arrived to the emergency department. He takes aspirin, but no other NSAIDs.  However, his medication profile showed prescriptions for ibuprofen and meloxicam .  He does not take anticoagulation.  No EtOH use.  No spicy foods.  No abdominal pain, vomiting or constipation.  He denied fever, chills, rhinorrhea, sore throat, wheezing or hemoptysis.  No chest pain, palpitations, diaphoresis, PND, orthopnea or pitting edema of the lower extremities.  No flank pain, dysuria, frequency or hematuria.  No polyuria, polydipsia, polyphagia or blurred vision.   Lab work: CBC 0 white count 9.2, hemoglobin 10.2 g/dL and platelets 750.  PT was 15.9 INR 1.2.  CMP showed normal electrolytes after calcium  correction.  Glucose 133, BUN 48 and creatinine 1.13 mg/dL.  Total protein is 5.3 and albumin 2.7 g/dL, the rest of the LFTs were normal.  Imaging: CTA GI bleed with no evidence of contrast extravasation into the gastric, duodenum, small bowel or colon lumen.  No acute findings in the abdomen or pelvis.  Left colonic diverticulosis without diverticulitis.  Aortic atherosclerosis.  ED course: Initial vital signs were temperature 98 F, pulse 97, respiration 17, BP 80/50 mmHg O2 sat 96%  on room air.  The patient received normal saline 2000 mL bolus.   Review of Systems: As mentioned in the history of present illness. All other systems reviewed and are negative. Past Medical History:  Diagnosis Date   Arthritis    Food impaction of esophagus    Gastric ulcer    Hypertension    Past Surgical History:  Procedure Laterality Date   BIOPSY  01/31/2018   Procedure: BIOPSY;  Surgeon: Saintclair Jasper, MD;  Location: [facility] ENDOSCOPY;  Service: Gastroenterology;;   ESOPHAGOGASTRODUODENOSCOPY N/A 01/31/2018   Procedure: ESOPHAGOGASTRODUODENOSCOPY (EGD);  Surgeon: Saintclair Jasper, MD;  Location: [facility] ENDOSCOPY;  Service: Gastroenterology;  Laterality: N/A;   FOREIGN BODY REMOVAL  01/31/2018   Procedure: FOREIGN BODY REMOVAL;  Surgeon: Saintclair Jasper, MD;  Location: [facility] ENDOSCOPY;  Service: Gastroenterology;;   HERNIA REPAIR     MOUTH SURGERY  2019   TOTAL HIP ARTHROPLASTY Right    Social History:  reports that he has never smoked. He has never used smokeless tobacco. He reports current alcohol  use. He reports that he does not use drugs.  Allergies  Allergen Reactions   Codeine Itching and Nausea And Vomiting   Gabapentin  Swelling and Other (See Comments)    Made the feet swell   Hydrocodone Nausea And Vomiting   Oxycodone Nausea And Vomiting   Tramadol Itching and Nausea And Vomiting    Family History  Problem Relation Age of Onset   Colon cancer Neg Hx    Esophageal cancer Neg Hx    Pancreatic cancer Neg Hx    Stomach cancer Neg Hx    Liver disease Neg Hx     Prior to Admission  medications   Medication Sig Start Date End Date Taking? Authorizing Provider  amLODipine  (NORVASC ) 5 MG tablet TAKE 1 TABLET BY MOUTH DAILY 03/29/23   Plotnikov, Aleksei V, MD  APPLE CIDER VINEGAR PO Take by mouth.    [provider]  aspirin EC 81 MG tablet Take 81 mg by mouth daily.    [provider]  Cholecalciferol (VITAMIN D3) 50 MCG (2000 UT) capsule Take 1 capsule (2,000 Units total)  by mouth daily. 06/17/18   Plotnikov, Aleksei V, MD  finasteride  (PROSCAR ) 5 MG tablet TAKE 1 TABLET(5 MG) BY MOUTH DAILY 05/17/23   Plotnikov, Aleksei V, MD  Glucosamine HCl (GLUCOSAMINE PO) Take by mouth.    [provider]  Glucosamine-Chondroitin-MSM (TRIPLE FLEX PO) Take 1 tablet by mouth daily.    [provider]  ibuprofen (ADVIL) 600 MG tablet Take 600 mg by mouth every 6 (six) hours as needed. 11/23/23   [provider]  lisinopril -hydrochlorothiazide  (ZESTORETIC ) 20-12.5 MG tablet TAKE 1 TABLET BY MOUTH EVERY DAY 09/27/23   Plotnikov, Aleksei V, MD  meloxicam  (MOBIC ) 7.5 MG tablet TAKE 1 TO 2 TABLETS(7.5 TO 15 MG) BY MOUTH DAILY AS NEEDED FOR PAIN 05/05/23   Plotnikov, Aleksei V, MD  Multiple Vitamins-Minerals (CENTRUM SILVER 50+MEN) TABS Take 1 tablet by mouth daily with breakfast.    [provider]  Omega-3 Fatty Acids (FISH OIL) 1000 MG CAPS Take 1,000 mg by mouth daily.    [provider]  pantoprazole  (PROTONIX ) 40 MG tablet TAKE 1 TABLET(40 MG) BY MOUTH TWICE DAILY 08/14/23   Plotnikov, Aleksei V, MD  VITAMIN E PO Take 1 capsule by mouth daily.    [provider]    Physical Exam: Vitals:   01/19/24 0615 01/19/24 0630 01/19/24 0645 01/19/24 0700  BP: 99/73 103/71 102/67 104/66  Pulse: 64 70 70 64  Resp: 16 (!) 22 (!) 22 (!) 21  Temp:      TempSrc:      SpO2: 98% 98% 97% 99%  Weight:      Height:       Physical Exam Vitals and nursing note reviewed.  Constitutional:      General: He is awake. He is not in acute distress.    Appearance: Normal appearance. He is ill-appearing.  HENT:     Head: Normocephalic.     Nose: No rhinorrhea.     Mouth/Throat:     Mouth: Mucous membranes are dry.  Eyes:     General: No scleral icterus.    Pupils: Pupils are equal, round, and reactive to light.  Neck:     Vascular: No JVD.  Cardiovascular:     Rate and Rhythm: Normal rate and regular rhythm.     Heart sounds: S1 normal and  S2 normal.  Pulmonary:     Effort: Pulmonary effort is normal.     Breath sounds: Normal breath sounds. No wheezing, rhonchi or rales.  Abdominal:     General: Bowel sounds are normal. There is no distension.     Palpations: Abdomen is soft.     Tenderness: There is no abdominal tenderness. There is no right CVA tenderness or left CVA tenderness.  Musculoskeletal:     Cervical back: Neck supple.     Right lower leg: No edema.     Left lower leg: No edema.  Skin:    General: Skin is warm and dry.     Coloration: Skin is pale.  Neurological:     General:  No focal deficit present.     Mental Status: He is alert and oriented to person, place, and time.  Psychiatric:        Mood and Affect: Mood normal.        Behavior: Behavior normal. Behavior is cooperative.     Data Reviewed:  Results are pending, will review when available. EKG: Vent. rate 83 BPM PR interval 182 ms QRS duration 88 ms QT/QTcB 384/452 ms P-R-T axes 79 73 69 Sinus rhythm  Assessment and Plan: Principal Problem:   GI bleed With associated:   ABLA (acute blood loss anemia) Admit to stepdown/inpatient. Clear liquid diet. Continue IV fluids. Will hold aspirin for now. Continue pantoprazole  40 mg every 12 hours.. Monitor hemogram hemoglobin. Transfuse if hemoglobin less than 8 g/dL. GI consult appreciated. - Will follow recommendations.  Active Problems:   Esophageal stricture   Barrett's esophagus with esophagitis Continue PPI.    Essential hypertension Hold antihypertensives today.    CAD (coronary artery disease) Hold aspirin and amlodipine . He is intolerant to statins.      Advance Care Planning:   Code Status: Full Code   Consults: [place] [facility].  Family Communication:   Severity of Illness: The appropriate patient status for this patient is INPATIENT. Inpatient status is judged to be reasonable and necessary in order to provide the required intensity of service to  ensure the patient's safety. The patient's presenting symptoms, physical exam findings, and initial radiographic and laboratory data in the context of their chronic comorbidities is felt to place them at high risk for further clinical deterioration. Furthermore, it is not anticipated that the patient will be medically stable for discharge from the hospital within 2 midnights of admission.   * I certify that at the point of admission it is my clinical judgment that the patient will require inpatient hospital care spanning beyond 2 midnights from the point of admission due to high intensity of service, high risk for further deterioration and high frequency of surveillance required.*  Author: Alm Dorn Castor, MD 01/19/2024 8:26 AM  For on call review [email or web address].   This document was prepared using Dragon voice recognition software and may contain some unintended transcription errors.

## 2024-01-19 NOTE — ED Provider Notes (Signed)
 [place] EMERGENCY DEPARTMENT AT [facility] Provider Note  CSN: [id number] Arrival date & time: 01/19/24 9476  Chief Complaint(s) Rectal Bleeding  HPI Mario Huerta is a 83 y.o. male who presents to the emergency department with acute GI bleed.  He reports maroon and bright red blood per rectum.  Denies any prior history of such.  Denies any anticoagulation.  Patient has had 4 bloody bowel movements since 2:30 AM.  Patient called his daughter this morning who transported the patient here.  He denies any abdominal pain.  No chest pain or shortness of breath.  No other physical complaints.  Patient reports having had colonoscopy by Dr. Lauree many years ago and denied any history of diverticulosis, AVMs, polyps or cancer.  The history is provided by the patient.    Past Medical History Past Medical History:  Diagnosis Date   Arthritis    Food impaction of esophagus    Gastric ulcer    Hypertension    Patient Active Problem List   Diagnosis Date Noted   Statin intolerance 08/22/2023   Gait disorder 02/19/2023   Abdominal pain 02/19/2023   CAD (coronary artery disease) 11/08/2022   DOE (dyspnea on exertion) 01/26/2021   Well adult exam 12/02/2020   Hip pain, acute, right 02/26/2020   Bladder neck obstruction 10/07/2019   Peripheral neuropathy 08/06/2019   Claudication (HCC) 08/06/2019   Neck pain 06/17/2018   Low back pain 06/17/2018   Paresthesia 06/17/2018   Esophageal stricture 06/17/2018   Rash 06/17/2018   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   Cerumen impaction 06/17/2018   Blood donor 06/17/2018   Essential hypertension 06/17/2018   Neoplasm of uncertain behavior of skin 06/17/2018   Actinic keratosis 06/17/2018   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 Authorizing Provider  amLODipine  (NORVASC ) 5 MG tablet TAKE 1 TABLET BY MOUTH DAILY  03/29/23   Plotnikov, Aleksei V, MD  APPLE CIDER VINEGAR PO Take by mouth.    [provider]  aspirin EC 81 MG tablet Take 81 mg by mouth daily.    [provider]  Cholecalciferol (VITAMIN D3) 50 MCG (2000 UT) capsule Take 1 capsule (2,000 Units total) by mouth daily. 06/17/18   Plotnikov, Karlynn GAILS, MD  finasteride  (PROSCAR ) 5 MG tablet TAKE 1 TABLET(5 MG) BY MOUTH DAILY 05/17/23   Plotnikov, Aleksei V, MD  Glucosamine HCl (GLUCOSAMINE PO) Take by mouth.    [provider]  Glucosamine-Chondroitin-MSM (TRIPLE FLEX PO) Take 1 tablet by mouth daily.    [provider]  ibuprofen (ADVIL) 600 MG tablet Take 600 mg by mouth every 6 (six) hours as needed. 11/23/23   [provider]  lisinopril -hydrochlorothiazide  (ZESTORETIC ) 20-12.5 MG tablet TAKE 1 TABLET BY MOUTH EVERY DAY 09/27/23   Plotnikov, Aleksei V, MD  meloxicam  (MOBIC ) 7.5 MG tablet TAKE 1 TO 2 TABLETS(7.5 TO 15 MG) BY MOUTH DAILY AS NEEDED FOR PAIN 05/05/23   Plotnikov, Aleksei V, MD  Multiple Vitamins-Minerals (CENTRUM SILVER 50+MEN) TABS Take 1 tablet by mouth daily with breakfast.    [provider]  Omega-3 Fatty Acids (FISH OIL) 1000 MG CAPS Take 1,000 mg by mouth daily.    [provider]  pantoprazole  (PROTONIX ) 40 MG tablet TAKE 1 TABLET(40 MG) BY MOUTH TWICE DAILY 08/14/23   Plotnikov, Aleksei V, MD  VITAMIN E PO Take 1 capsule by mouth daily.  [provider]                                                                                                                                    Allergies Codeine, Gabapentin , Hydrocodone, Oxycodone, and Tramadol  Review of Systems Review of Systems As noted in HPI  Physical Exam Vital Signs  I have reviewed the triage vital signs BP 104/66   Pulse 64   Temp 98 F (36.7 C) (Oral)   Resp (!) 21   Ht 5' 10 (1.778 m)   Wt 87.5 kg   SpO2 99%   BMI 27.69 kg/m   Physical Exam Vitals reviewed.   Constitutional:      General: He is not in acute distress.    Appearance: He is well-developed. He is not diaphoretic.  HENT:     Head: Normocephalic and atraumatic.     Right Ear: External ear normal.     Left Ear: External ear normal.     Nose: Nose normal.     Mouth/Throat:     Mouth: Mucous membranes are moist.  Eyes:     General: No scleral icterus.    Conjunctiva/sclera: Conjunctivae normal.  Neck:     Trachea: Phonation normal.  Cardiovascular:     Rate and Rhythm: Normal rate and regular rhythm.  Pulmonary:     Effort: Pulmonary effort is normal. No respiratory distress.     Breath sounds: No stridor.  Abdominal:     General: There is no distension.     Tenderness: There is no abdominal tenderness.  Musculoskeletal:        General: Normal range of motion.     Cervical back: Normal range of motion.  Neurological:     Mental Status: He is alert and oriented to person, place, and time.  Psychiatric:        Behavior: Behavior normal.     ED Results and Treatments Labs (all labs ordered are listed, but only abnormal results are displayed) Labs Reviewed  COMPREHENSIVE METABOLIC PANEL WITH GFR - Abnormal; Notable for the following components:      Result Value   Glucose, Bld 133 (*)    BUN 48 (*)    Calcium  8.2 (*)    Total Protein 5.3 (*)    Albumin 2.7 (*)    All other components within normal limits  CBC WITH DIFFERENTIAL/PLATELET - Abnormal; Notable for the following components:   RBC 3.28 (*)    Hemoglobin 10.2 (*)    HCT 31.3 (*)    All other components within normal limits  PROTIME-INR - Abnormal; Notable for the following components:   Prothrombin Time 15.9 (*)    All other components within normal limits  I-STAT CHEM 8, ED  TYPE AND SCREEN  ABO/RH  EKG  EKG Interpretation Date/Time:  Saturday January 19 2024 05:49:50  EDT Ventricular Rate:  83 PR Interval:  182 QRS Duration:  88 QT Interval:  384 QTC Calculation: 452 R Axis:   73  Text Interpretation: Sinus rhythm Confirmed by Trine Likes [phone]) on 01/19/2024 6:02:20 AM       Radiology No results found.  Medications Ordered in ED Medications  sodium chloride  0.9 % bolus 1,000 mL (0 mLs Intravenous Stopped 01/19/24 0704)    And  sodium chloride  0.9 % bolus 1,000 mL (0 mLs Intravenous Stopped 01/19/24 0704)    And  0.9 %  sodium chloride  infusion (has no administration in time range)  pantoprazole  (PROTONIX ) injection 40 mg (has no administration in time range)  iohexol  (OMNIPAQUE ) 350 MG/ML injection 100 mL (100 mLs Intravenous Contrast Given 01/19/24 0718)   Procedures .Critical Care  Performed by: Trine Likes Moder, MD Authorized by: Trine Likes Moder, MD   Critical care provider statement:    Critical care time (minutes):  45   Critical care time was exclusive of:  Separately billable procedures and treating other patients   Critical care was necessary to treat or prevent imminent or life-threatening deterioration of the following conditions:  Circulatory failure   Critical care was time spent personally by me on the following activities:  Development of treatment plan with patient or surrogate, discussions with consultants, evaluation of patient's response to treatment, examination of patient, obtaining history from patient or surrogate, review of old charts, re-evaluation of patient's condition, pulse oximetry, ordering and review of radiographic studies, ordering and review of laboratory studies and ordering and performing treatments and interventions   Care discussed with: admitting provider     (including critical care time) Medical Decision Making / ED Course   Medical Decision Making Amount and/or Complexity of Data Reviewed Labs: ordered. Decision-making details documented in ED Course. Radiology:  ordered. ECG/medicine tests: ordered and independent interpretation performed. Decision-making details documented in ED Course.  Risk Prescription drug management. Decision regarding hospitalization.    GI bleed and hypotension  Differential diagnoses considered  Description of blood is concerning for lower GI bleed.  Patient is maintaining well.  Will provide 2 L of IV fluids. Will obtain CT scan and attempt to identify source of bleeding and rule out aortoenteric fistula.  CBC without leukocytosis.  Hemoglobin at 10.2 down from 14, 5 months ago. CMP without significant electrolyte derangements or renal sufficiency.  BUN is elevated at 48.  No bili obstruction Normal INR.  CT pending.   Clinical Course as of 01/19/24 0800  Sat Jan 19, 2024  0710 Blood pressure improved with systolics 80s up to the low 100s after IV fluids.  Patient care turned over to oncoming provider. Patient case and results discussed in detail; please see their note for further ED managment.     [PC]    Clinical Course User Index [PC] Brandis Matsuura, Likes Moder, MD    Final Clinical Impression(s) / ED Diagnoses Final diagnoses:  Rectal bleeding  Hypotension due to hypovolemia    This chart was dictated using voice recognition software.  Despite best efforts to proofread,  errors can occur which can change the documentation meaning.    Trine Likes Moder, MD 01/19/24 0800

## 2024-01-19 NOTE — ED Provider Notes (Signed)
 Received patient in turnover from Dr. Trine.  Please see their note for further details of Hx, PE.  Briefly patient is a 83 y.o. male with a Rectal Bleeding .  Sounds like gross melena.  4 events this morning.  Low blood pressures on arrival.  4 g drop in hemoglobin when compared to about 4 months ago.  Plan for CT and then admit.  CT bleed study without obvious etiology.   Patient reassessed, no continued bleeding thus far in the ED.  He is feeling a bit better.   I discussed case with Dr. Federico, [facility] gastroenterology will come and see patient at bedside.  Will discuss with medicine for admission.        Emil Share, DO 01/19/24 [phone]

## 2024-01-19 NOTE — ED Notes (Signed)
 ED TO INPATIENT HANDOFF REPORT  Name/Age/Gender Mario Huerta 83 y.o. male  Code Status    Code Status Orders  (From admission, onward)           Start     Ordered   01/19/24 0830  Full code  Continuous       Question:  By:  Answer:  Consent: discussion documented in EHR   01/19/24 0830           Code Status History     This patient has a current code status but no historical code status.      Advance Directive Documentation    Flowsheet Row Most Recent Value  Type of Advance Directive Healthcare Power of Attorney, Living will  Pre-existing out of facility DNR order (yellow form or pink MOST form) --  MOST Form in Place? --    Home/SNF/Other home  Chief Complaint UGI bleed [K92.2]  Level of Care/Admitting Diagnosis ED Disposition     ED Disposition  Admit   Condition  --   Comment  Hospital Area: [facility] [[id number]]  Level of Care: Stepdown [14]  Admit to SDU based on following criteria: Hemodynamic compromise or significant risk of instability:  Patient requiring short term acute titration and management of vasoactive drips, and invasive monitoring (i.e., CVP and Arterial line).  May admit patient to [facility] or [facility] if equivalent level of care is available:: No  Covid Evaluation: Asymptomatic - no recent exposure (last 10 days) testing not required  Diagnosis: UGI bleed [267063]  Admitting Physician: CELINDA ALM LOT [[id number]]  Attending Physician: CELINDA ALM LOT [[id number]]  Certification:: I certify this patient will need inpatient services for at least 2 midnights  Expected Medical Readiness: 01/21/2024          Medical History Past Medical History:  Diagnosis Date   Arthritis    Food impaction of esophagus    Gastric ulcer    Hypertension     Allergies Allergies  Allergen Reactions   Codeine Itching and Nausea And Vomiting   Gabapentin  Swelling and Other (See Comments)    Made the feet  swell   Hydrocodone Nausea And Vomiting   Oxycodone Nausea And Vomiting   Tramadol Itching and Nausea And Vomiting    IV Location/Drains/Wounds Patient Lines/Drains/Airways Status     Active Line/Drains/Airways     Name Placement date Placement time Site Days   Peripheral IV 01/19/24 18 G 1.16 Anterior;Left Forearm 01/19/24  0551  Forearm  less than 1   Peripheral IV 01/19/24 18 G Anterior;Right Forearm 01/19/24  0550  Forearm  less than 1            Labs/Imaging Results for orders placed or performed during the hospital encounter of 01/19/24 (from the past 48 hours)  Comprehensive metabolic panel     Status: Abnormal   Collection Time: 01/19/24  5:35 AM  Result Value Ref Range   Sodium 139 135 - 145 mmol/L   Potassium 3.9 3.5 - 5.1 mmol/L   Chloride 108 98 - 111 mmol/L   CO2 23 22 - 32 mmol/L   Glucose, Bld 133 (H) 70 - 99 mg/dL    Comment: Glucose reference range applies only to samples taken after fasting for at least 8 hours.   BUN 48 (H) 8 - 23 mg/dL   Creatinine, Ser 8.86 0.61 - 1.24 mg/dL   Calcium  8.2 (L) 8.9 - 10.3 mg/dL   Total Protein 5.3 (L) 6.5 -  8.1 g/dL   Albumin 2.7 (L) 3.5 - 5.0 g/dL   AST 30 15 - 41 U/L   ALT 19 0 - 44 U/L   Alkaline Phosphatase 70 38 - 126 U/L   Total Bilirubin 0.5 0.0 - 1.2 mg/dL   GFR, Estimated >39 >39 mL/min    Comment: (NOTE) Calculated using the CKD-EPI Creatinine Equation (2021)    Anion gap 8 5 - 15    Comment: Performed at [facility], [address], [place], [place] [address]  CBC with Differential     Status: Abnormal   Collection Time: 01/19/24  5:35 AM  Result Value Ref Range   WBC 9.2 4.0 - 10.5 K/uL   RBC 3.28 (L) 4.22 - 5.81 MIL/uL   Hemoglobin 10.2 (L) 13.0 - 17.0 g/dL   HCT 68.6 (L) 60.9 - 47.9 %   MCV 95.4 80.0 - 100.0 fL   MCH 31.1 26.0 - 34.0 pg   MCHC 32.6 30.0 - 36.0 g/dL   RDW 86.3 88.4 - 84.4 %   Platelets 249 150 - 400 K/uL   nRBC 0.0 0.0 - 0.2 %   Neutrophils Relative % 78  %   Neutro Abs 7.1 1.7 - 7.7 K/uL   Lymphocytes Relative 16 %   Lymphs Abs 1.4 0.7 - 4.0 K/uL   Monocytes Relative 5 %   Monocytes Absolute 0.5 0.1 - 1.0 K/uL   Eosinophils Relative 1 %   Eosinophils Absolute 0.1 0.0 - 0.5 K/uL   Basophils Relative 0 %   Basophils Absolute 0.0 0.0 - 0.1 K/uL   Immature Granulocytes 0 %   Abs Immature Granulocytes 0.03 0.00 - 0.07 K/uL    Comment: Performed at [facility], [address] [place], [place] [address]  Protime-INR     Status: Abnormal   Collection Time: 01/19/24  5:35 AM  Result Value Ref Range   Prothrombin Time 15.9 (H) 11.4 - 15.2 seconds   INR 1.2 0.8 - 1.2    Comment: (NOTE) INR goal varies based on device and disease states. Performed at [facility], [address] [place], [place] [address]   ABO/Rh     Status: None   Collection Time: 01/19/24  5:35 AM  Result Value Ref Range   ABO/RH(D)      O POS Performed at [facility], [address] [place], [place] [address]   Type and screen [facility]     Status: None   Collection Time: 01/19/24  5:40 AM  Result Value Ref Range   ABO/RH(D) O POS    Antibody Screen NEG    Sample Expiration      01/22/2024,2359 Performed at [facility], [address] [place], [place] [address]    CT ANGIO GI BLEED Result Date: 01/19/2024 CLINICAL DATA:  Hypotension. Rectal bleeding. Nausea dizziness and generalized weakness. EXAM: CTA ABDOMEN AND PELVIS WITHOUT AND WITH CONTRAST TECHNIQUE: Multidetector CT imaging of the abdomen and pelvis was performed using the standard protocol during bolus administration of intravenous contrast. Multiplanar reconstructed images and MIPs were obtained and reviewed to evaluate the vascular anatomy. RADIATION DOSE REDUCTION: This exam was performed according to the departmental dose-optimization program which includes automated exposure control, adjustment of the  mA and/or kV according to patient size and/or use of iterative reconstruction technique. CONTRAST:  OMNIPAQUE  IOHEXOL  350 MG/ML SOLN COMPARISON:  None Available. FINDINGS: VASCULAR Aorta: Moderate atherosclerotic wall calcification.Normal caliber aorta  without aneurysm, dissection, vasculitis or significant stenosis. Celiac: Patent without evidence of aneurysm, dissection, vasculitis or significant stenosis. SMA: Patent without evidence of aneurysm, dissection, vasculitis or significant stenosis. Renals: Both renal arteries are patent without evidence of aneurysm, dissection, vasculitis, fibromuscular dysplasia or significant stenosis. Accessory right renal artery. IMA: Patent without evidence of aneurysm, dissection, vasculitis or significant stenosis.1 Inflow: Patent without evidence of aneurysm, dissection, vasculitis or significant stenosis. Proximal Outflow: Bilateral common femoral and visualized portions of the superficial and profunda femoral arteries are patent without evidence of aneurysm, dissection, vasculitis or significant stenosis. Veins: No obvious venous abnormality within the limitations of this arterial phase study. Review of the MIP images confirms the above findings. NON-VASCULAR Lower chest: Dependent atelectasis noted right lower lobe. Hiatal hernia contains mainly fat. Hepatobiliary: A tiny hypodensity in the liver parenchyma is too small to characterize but is statistically most likely benign. No followup imaging is recommended. No suspicious focal abnormality within the liver parenchyma. There is no evidence for gallstones, gallbladder wall thickening, or pericholecystic fluid. No intrahepatic or extrahepatic biliary dilation. Pancreas: No focal mass lesion. No dilatation of the main duct. No intraparenchymal cyst. No peripancreatic edema. Spleen: No splenomegaly. No suspicious focal mass lesion. Adrenals/Urinary Tract: No adrenal nodule or mass. Tiny well-defined homogeneous  low-density lesions in both kidneys are too small to characterize but are statistically most likely benign and probably cysts. No followup imaging is recommended. Mild fullness noted in both intrarenal collecting systems. No hydroureter. The urinary bladder appears normal for the degree of distention. Stomach/Bowel: No evidence for contrast extravasation into the gastric lumen to suggest active hemorrhage. No findings to suggest vascular lesion. Duodenum is normally positioned as is the ligament of Treitz. No contrast extravasation in the duodenal lumen. No small bowel wall thickening. No small bowel dilatation. No intraluminal contrast extravasation in the small bowel loops. Mobile cecum is positioned in the left lower abdomen. The terminal ileum is normal. The appendix is not well visualized, but there is no edema or inflammation in the region of the cecal tip to suggest appendicitis. No definite evidence for contrast extravasation into the colon lumen to suggest active or ongoing bleeding. There is a focus of high attenuation noted in the splenic flexure (axial 38/15) but this is in a region of substantial motion artifact and no accumulation of contrast material in this region on more delayed imaging to suggest ongoing hemorrhage. Diverticular changes are noted in the left colon without evidence of diverticulitis. Lymphatic: There is no gastrohepatic or hepatoduodenal ligament lymphadenopathy. No retroperitoneal or mesenteric lymphadenopathy. No pelvic sidewall lymphadenopathy. Reproductive: The prostate gland and seminal vesicles are unremarkable. Other: No intraperitoneal free fluid. Musculoskeletal: Right hip replacement1. No worrisome lytic or sclerotic osseous abnormality. IMPRESSION: 1. No definite evidence for contrast extravasation into the gastric lumen, duodenum, small bowel, or colon lumen to suggest active or ongoing bleeding. 2. No acute findings in the abdomen or pelvis. 3. Left colonic  diverticulosis without diverticulitis. 4.  Aortic Atherosclerosis (ICD10-I70.0). Electronically Signed   By: Camellia Candle M.D.   On: 01/19/2024 08:02    Pending Labs Unresulted Labs (From admission, onward)     Start     Ordered   01/20/24 0500  CBC  Tomorrow morning,   R        01/19/24 0830   01/20/24 0500  Comprehensive metabolic panel  Tomorrow morning,   R        01/19/24 0830   01/19/24 1000  Hemoglobin and hematocrit, blood  Every 6 hours,   R (with TIMED occurrences)      01/19/24 0830            Vitals/Pain Today's Vitals   01/19/24 0630 01/19/24 0645 01/19/24 0700 01/19/24 0848  BP: 103/71 102/67 104/66   Pulse: 70 70 64   Resp: (!) 22 (!) 22 (!) 21   Temp:    98.2 F (36.8 C)  TempSrc:    Oral  SpO2: 98% 97% 99%   Weight:      Height:      PainSc:        Isolation Precautions No active isolations  Medications Medications  sodium chloride  0.9 % bolus 1,000 mL (0 mLs Intravenous Stopped 01/19/24 0704)    And  sodium chloride  0.9 % bolus 1,000 mL (0 mLs Intravenous Stopped 01/19/24 0704)    And  0.9 %  sodium chloride  infusion ( Intravenous New Bag/Given 01/19/24 0851)  pantoprazole  (PROTONIX ) injection 40 mg (has no administration in time range)  acetaminophen  (TYLENOL ) tablet 650 mg (has no administration in time range)    Or  acetaminophen  (TYLENOL ) suppository 650 mg (has no administration in time range)  ondansetron  (ZOFRAN ) tablet 4 mg (has no administration in time range)    Or  ondansetron  (ZOFRAN ) injection 4 mg (has no administration in time range)  iohexol  (OMNIPAQUE ) 350 MG/ML injection 100 mL (100 mLs Intravenous Contrast Given 01/19/24 0718)    Mobility walks with device

## 2024-01-19 NOTE — ED Triage Notes (Signed)
 Pt arrives with c/o rectal bleeding that started this morning around 2am. Pt reported nausea, dizziness, and generalized weakness. Pt is hypotensive is triage. Pt takes aspirin, but no blood thinners. Pt denies ABD Pain.

## 2024-01-20 DIAGNOSIS — E861 Hypovolemia: Secondary | ICD-10-CM | POA: Diagnosis not present

## 2024-01-20 DIAGNOSIS — K625 Hemorrhage of anus and rectum: Secondary | ICD-10-CM

## 2024-01-20 LAB — COMPREHENSIVE METABOLIC PANEL WITH GFR
ALT: 17 U/L (ref 0–44)
AST: 26 U/L (ref 15–41)
Albumin: 2.6 g/dL — ABNORMAL LOW (ref 3.5–5.0)
Alkaline Phosphatase: 60 U/L (ref 38–126)
Anion gap: 5 (ref 5–15)
BUN: 24 mg/dL — ABNORMAL HIGH (ref 8–23)
CO2: 22 mmol/L (ref 22–32)
Calcium: 7.7 mg/dL — ABNORMAL LOW (ref 8.9–10.3)
Chloride: 115 mmol/L — ABNORMAL HIGH (ref 98–111)
Creatinine, Ser: 0.67 mg/dL (ref 0.61–1.24)
GFR, Estimated: >60 mL/min (ref >60)
Glucose, Bld: 96 mg/dL (ref 70–99)
Potassium: 3.5 mmol/L (ref 3.5–5.1)
Sodium: 142 mmol/L (ref 135–145)
Total Bilirubin: 0.9 mg/dL (ref 0.0–1.2)
Total Protein: 4.9 g/dL — ABNORMAL LOW (ref 6.5–8.1)

## 2024-01-20 LAB — CBC
HCT: 26 % — ABNORMAL LOW (ref 39.0–52.0)
HCT: 30.3 % — ABNORMAL LOW (ref 39.0–52.0)
Hemoglobin: 8.5 g/dL — ABNORMAL LOW (ref 13.0–17.0)
Hemoglobin: 10.1 g/dL — ABNORMAL LOW (ref 13.0–17.0)
MCH: 31 pg (ref 26.0–34.0)
MCH: 31.6 pg (ref 26.0–34.0)
MCHC: 32.7 g/dL (ref 30.0–36.0)
MCHC: 33.3 g/dL (ref 30.0–36.0)
MCV: 94.9 fL (ref 80.0–100.0)
MCV: 94.7 fL (ref 80.0–100.0)
Platelets: 159 K/uL (ref 150–400)
Platelets: 178 K/uL (ref 150–400)
RBC: 2.74 MIL/uL — ABNORMAL LOW (ref 4.22–5.81)
RBC: 3.2 MIL/uL — ABNORMAL LOW (ref 4.22–5.81)
RDW: 14.2 % (ref 11.5–15.5)
RDW: 14 % (ref 11.5–15.5)
WBC: 5.6 K/uL (ref 4.0–10.5)
WBC: 7.4 K/uL (ref 4.0–10.5)
nRBC: 0 % (ref 0.0–0.2)
nRBC: 0 % (ref 0.0–0.2)

## 2024-01-20 LAB — BASIC METABOLIC PANEL WITH GFR
Anion gap: 8 (ref 5–15)
BUN: 17 mg/dL (ref 8–23)
CO2: 22 mmol/L (ref 22–32)
Calcium: 8.2 mg/dL — ABNORMAL LOW (ref 8.9–10.3)
Chloride: 108 mmol/L (ref 98–111)
Creatinine, Ser: 0.82 mg/dL (ref 0.61–1.24)
GFR, Estimated: >60 mL/min (ref >60)
Glucose, Bld: 110 mg/dL — ABNORMAL HIGH (ref 70–99)
Potassium: 3.7 mmol/L (ref 3.5–5.1)
Sodium: 138 mmol/L (ref 135–145)

## 2024-01-20 LAB — MAGNESIUM: Magnesium: 2 mg/dL (ref 1.7–2.4)

## 2024-01-20 MED ORDER — POLYVINYL ALCOHOL 1.4 % OP SOLN
1.0000 [drp] | OPHTHALMIC | Status: DC | PRN
  Administered 2024-01-21 (×2): 1 [drp] via OPHTHALMIC
  Filled 2024-01-20: qty 15

## 2024-01-20 MED ORDER — GUAIFENESIN 100 MG/5ML PO LIQD: 5.0000 mL | ORAL | Status: DC | PRN

## 2024-01-20 MED ORDER — DILTIAZEM HCL-DEXTROSE 125-5 MG/125ML-% IV SOLN (PREMIX)
5.0000 mg/h | INTRAVENOUS | Status: DC
  Administered 2024-01-21: 5 mg/h via INTRAVENOUS
  Filled 2024-01-20: qty 125

## 2024-01-20 NOTE — Hospital Course (Signed)
 83 y.o. male with medical history significant of osteoarthritis, food impaction of the esophagus, Barrett's esophagus with esophagitis, esophageal stricture, gastric ulcer, CAD, hypertension, bladder neck obstruction, peripheral neuropathy, presented to the emergency department with history of multiple episodes of dark red loose stools associated with dizziness, generalized weakness and nausea.  He was initially hypotensive when he arrived to the emergency department. He takes aspirin, but no other NSAIDs.  However, his medication profile showed prescriptions for ibuprofen and meloxicam .  He does not take anticoagulation.  No EtOH use.  No spicy foods.  No abdominal pain, vomiting or constipation.  He denied fever, chills, rhinorrhea, sore throat, wheezing or hemoptysis.  No chest pain, palpitations, diaphoresis, PND, orthopnea or pitting edema of the lower extremities.  No flank pain, dysuria, frequency or hematuria.  No polyuria, polydipsia, polyphagia or blurred vision.

## 2024-01-20 NOTE — Progress Notes (Signed)
  Progress Note   Patient: Mario Huerta FMW:[id number] DOB: Feb 06, 1941 DOA: 01/19/2024     1 DOS: the patient was seen and examined on 01/20/2024   Brief hospital course: 83 y.o. male with medical history significant of osteoarthritis, food impaction of the esophagus, Barrett's esophagus with esophagitis, esophageal stricture, gastric ulcer, CAD, hypertension, bladder neck obstruction, peripheral neuropathy, presented to the emergency department with history of multiple episodes of dark red loose stools associated with dizziness, generalized weakness and nausea.  He was initially hypotensive when he arrived to the emergency department. He takes aspirin, but no other NSAIDs.  However, his medication profile showed prescriptions for ibuprofen and meloxicam .  He does not take anticoagulation.  No EtOH use.  No spicy foods.  No abdominal pain, vomiting or constipation.  He denied fever, chills, rhinorrhea, sore throat, wheezing or hemoptysis.  No chest pain, palpitations, diaphoresis, PND, orthopnea or pitting edema of the lower extremities.  No flank pain, dysuria, frequency or hematuria.  No polyuria, polydipsia, polyphagia or blurred vision.   Assessment and Plan: Principal Problem:   GI bleed With associated:   ABLA (acute blood loss anemia) Admit to stepdown/inpatient. Holding aspirin  Continue pantoprazole  40 mg every 12 hours.. GI consulted and is following, plan to repeat CTA if pt has signs of recurrent acure lower GI bleed Advancing diet.     Active Problems:   Esophageal stricture   Barrett's esophagus with esophagitis Continue PPI.     Essential hypertension Hold antihypertensives today.     CAD (coronary artery disease) Hold aspirin and amlodipine . He is intolerant to statins.       Subjective: Without complaints this AM  Physical Exam: Vitals:   01/20/24 1100 01/20/24 1200 01/20/24 1300 01/20/24 1400  BP: 135/81 (!) 153/102 (!) 152/77 (!) 155/67  Pulse: 71 71 73 69   Resp: (!) 21 18 (!) 22 (!) 22  Temp:      TempSrc:      SpO2: 94% 98% 97% 95%  Weight:      Height:       General exam: Awake, laying in bed, in nad Respiratory system: Normal respiratory effort, no wheezing Cardiovascular system: regular rate, s1, s2 Gastrointestinal system: Soft, nondistended, positive BS Central nervous system: CN2-12 grossly intact, strength intact Extremities: Perfused, no clubbing Skin: Normal skin turgor, no notable skin lesions seen Psychiatry: Mood normal // no visual hallucinations   Data Reviewed:  Labs reviewed: Na 142, K 3.5, Cr 0.67, WBC 5.6, Hgb 8.5, Plts 159  Family Communication: Pt in room, family at bedside  Disposition: Status is: Inpatient Remains inpatient appropriate because: severity of illness  Planned Discharge Destination: Home   Author: Garnette Pelt, MD 01/20/2024 4:09 PM  For on call review [email or web address].

## 2024-01-20 NOTE — Plan of Care (Signed)
  Problem: Education: Goal: Knowledge of General Education information will improve Description: Including pain rating scale, medication(s)/side effects and non-pharmacologic comfort measures Outcome: Progressing   Problem: Clinical Measurements: Goal: Respiratory complications will improve Outcome: Progressing Goal: Cardiovascular complication will be avoided Outcome: Progressing   Problem: Elimination: Goal: Will not experience complications related to urinary retention Outcome: Progressing   Problem: Nutrition: Goal: Adequate nutrition will be maintained Outcome: Not Progressing   Problem: Pain Managment: Goal: General experience of comfort will improve and/or be controlled Outcome: Not Progressing

## 2024-01-20 NOTE — Progress Notes (Signed)
 [facility] ROUNDING NOTE   Subjective: Received 1 unit PRBC.  Hemoglobin 8.5 this morning Last bowel movement was yesterday evening, none since then   Objective: Vital signs in last 24 hours: Temp:  [97.4 F (36.3 C)-97.9 F (36.6 C)] 97.5 F (36.4 C) (08/24 0847) Pulse Rate:  [67-83] 79 (08/24 0900) Resp:  [15-25] 16 (08/24 0900) BP: (118-159)/(66-99) 140/84 (08/24 0900) SpO2:  [92 %-99 %] 96 % (08/24 0900) Last BM Date : 01/19/24 General: NAD Abdomen: Soft, no distention or tenderness. Ext: No edema    Intake/Output from previous day: 08/23 0701 - 08/24 0700 In: 2703.7 [I.V.:2337; Blood:366.7] Out: 4400 [Urine:4400] Intake/Output this shift: Total I/O In: 244.8 [I.V.:244.8] Out: 850 [Urine:850]   Lab Results: Recent Labs    01/19/24 0535 01/19/24 1130 01/19/24 1539 01/19/24 2103 01/20/24 0227  WBC 9.2  --   --   --  5.6  HGB 10.2*   < > 8.6* 9.6* 8.5*  PLT 249  --   --   --  159  MCV 95.4  --   --   --  94.9   < > = values in this interval not displayed.   BMET Recent Labs    01/19/24 0535 01/20/24 0227  NA 139 142  K 3.9 3.5  CL 108 115*  CO2 23 22  GLUCOSE 133* 96  BUN 48* 24*  CREATININE 1.13 0.67  CALCIUM  8.2* 7.7*   LFT Recent Labs    01/19/24 0535 01/20/24 0227  PROT 5.3* 4.9*  ALBUMIN 2.7* 2.6*  AST 30 26  ALT 19 17  ALKPHOS 70 60  BILITOT 0.5 0.9   PT/INR Recent Labs    01/19/24 0535  INR 1.2      Imaging/Other results: CT ANGIO GI BLEED Result Date: 01/19/2024 CLINICAL DATA:  Hypotension. Rectal bleeding. Nausea dizziness and generalized weakness. EXAM: CTA ABDOMEN AND PELVIS WITHOUT AND WITH CONTRAST TECHNIQUE: Multidetector CT imaging of the abdomen and pelvis was performed using the standard protocol during bolus administration of intravenous contrast. Multiplanar reconstructed images and MIPs were obtained and reviewed to evaluate the vascular anatomy. RADIATION DOSE REDUCTION: This exam was performed  according to the departmental dose-optimization program which includes automated exposure control, adjustment of the mA and/or kV according to patient size and/or use of iterative reconstruction technique. CONTRAST:  OMNIPAQUE  IOHEXOL  350 MG/ML SOLN COMPARISON:  None Available. FINDINGS: VASCULAR Aorta: Moderate atherosclerotic wall calcification.Normal caliber aorta without aneurysm, dissection, vasculitis or significant stenosis. Celiac: Patent without evidence of aneurysm, dissection, vasculitis or significant stenosis. SMA: Patent without evidence of aneurysm, dissection, vasculitis or significant stenosis. Renals: Both renal arteries are patent without evidence of aneurysm, dissection, vasculitis, fibromuscular dysplasia or significant stenosis. Accessory right renal artery. IMA: Patent without evidence of aneurysm, dissection, vasculitis or significant stenosis.1 Inflow: Patent without evidence of aneurysm, dissection, vasculitis or significant stenosis. Proximal Outflow: Bilateral common femoral and visualized portions of the superficial and profunda femoral arteries are patent without evidence of aneurysm, dissection, vasculitis or significant stenosis. Veins: No obvious venous abnormality within the limitations of this arterial phase study. Review of the MIP images confirms the above findings. NON-VASCULAR Lower chest: Dependent atelectasis noted right lower lobe. Hiatal hernia contains mainly fat. Hepatobiliary: A tiny hypodensity in the liver parenchyma is too small to characterize but is statistically most likely benign. No followup imaging is recommended. No suspicious focal abnormality within the liver parenchyma. There is no evidence for gallstones, gallbladder wall thickening, or pericholecystic fluid. No intrahepatic or  extrahepatic biliary dilation. Pancreas: No focal mass lesion. No dilatation of the main duct. No intraparenchymal cyst. No peripancreatic edema. Spleen: No splenomegaly. No  suspicious focal mass lesion. Adrenals/Urinary Tract: No adrenal nodule or mass. Tiny well-defined homogeneous low-density lesions in both kidneys are too small to characterize but are statistically most likely benign and probably cysts. No followup imaging is recommended. Mild fullness noted in both intrarenal collecting systems. No hydroureter. The urinary bladder appears normal for the degree of distention. Stomach/Bowel: No evidence for contrast extravasation into the gastric lumen to suggest active hemorrhage. No findings to suggest vascular lesion. Duodenum is normally positioned as is the ligament of Treitz. No contrast extravasation in the duodenal lumen. No small bowel wall thickening. No small bowel dilatation. No intraluminal contrast extravasation in the small bowel loops. Mobile cecum is positioned in the left lower abdomen. The terminal ileum is normal. The appendix is not well visualized, but there is no edema or inflammation in the region of the cecal tip to suggest appendicitis. No definite evidence for contrast extravasation into the colon lumen to suggest active or ongoing bleeding. There is a focus of high attenuation noted in the splenic flexure (axial 38/15) but this is in a region of substantial motion artifact and no accumulation of contrast material in this region on more delayed imaging to suggest ongoing hemorrhage. Diverticular changes are noted in the left colon without evidence of diverticulitis. Lymphatic: There is no gastrohepatic or hepatoduodenal ligament lymphadenopathy. No retroperitoneal or mesenteric lymphadenopathy. No pelvic sidewall lymphadenopathy. Reproductive: The prostate gland and seminal vesicles are unremarkable. Other: No intraperitoneal free fluid. Musculoskeletal: Right hip replacement1. No worrisome lytic or sclerotic osseous abnormality. IMPRESSION: 1. No definite evidence for contrast extravasation into the gastric lumen, duodenum, small bowel, or colon lumen to  suggest active or ongoing bleeding. 2. No acute findings in the abdomen or pelvis. 3. Left colonic diverticulosis without diverticulitis. 4.  Aortic Atherosclerosis (ICD10-I70.0). Electronically Signed   By: Camellia Candle M.D.   On: 01/19/2024 08:02      Assessment &Plan  Acute lower gastrointestinal bleeding due to diverticulosis Acute lower gastrointestinal bleeding secondary to diverticulosis with significant blood loss, losing half of blood volume within a day.  Explained to patient and his daughter at bedside that current bleeding appears to be old blood, indicating stabilization. Risk of rebleeding due to arterial involvement, which can lead to rapid and dangerous blood loss. - Monitor CBC Advance to soft diet as tolerated Plan for repeat CTA if has signs of recurrent acute lower GI bleed  Patient will benefit from colonoscopy as outpatient once acute issues resolved to exclude any neoplastic lesion, last colonoscopy was many years ago no records in epic  GI will continue to follow along   This visit required >35 minutes of patient care (this includes precharting, chart review, review of results, face-to-face time used for counseling as well as treatment plan and follow-up. The patient was provided an opportunity to ask questions and all were answered. The patient agreed with the plan and demonstrated an understanding of the instructions.     LOIS Wilkie Mcgee , MD [phone]  [facility]

## 2024-01-20 NOTE — TOC Initial Note (Signed)
 Transition of Care [facility]) - Initial/Assessment Note    Patient Details  Name: Mario Huerta MRN: [id number] Date of Birth: 1940-12-16  [facility]) CM/SW Contact:    Sonda Manuella Quill, RN Phone Number: 01/20/2024, 10:26 AM  Clinical Narrative:                 Beatris w/ pt and dtr Debbie in room; pt said he lives at home w/ his dtr Andrez; he plans to return at d/c; pt gave POCs Deavion Dobbs (dtr) [phone] / Marval Batty (dtr) [phone]; family will provide transportation; pt verified insurance/PCP; he denied SDOH risks; pt has cane and walker; he does not have HH services, or home oxygen; no TOC needs; TOC signing off;please place consult if needed.  Expected Discharge Plan: Home/Self Care Barriers to Discharge: Continued Medical Work up   Patient Goals and CMS Choice Patient states their goals for this hospitalization and ongoing recovery are:: home CMS Medicare.gov Compare Post Acute Care list provided to:: Patient   [place] ownership interest in [facility].provided to:: Patient    Expected Discharge Plan and Services   Discharge Planning Services: CM Consult   Living arrangements for the past 2 months: Single Family Home                 DME Arranged: N/A DME Agency: NA       HH Arranged: NA HH Agency: NA        Prior Living Arrangements/Services Living arrangements for the past 2 months: Single Family Home Lives with:: Adult Children Patient language and need for interpreter reviewed:: Yes Do you feel safe going back to the place where you live?: Yes      Need for Family Participation in Patient Care: Yes (Comment) Care giver support system in place?: Yes (comment) Current home services: DME (cane, walker) Criminal Activity/Legal Involvement Pertinent to Current Situation/Hospitalization: No - Comment as needed  Activities of Daily Living   ADL Screening (condition at time of admission) Independently performs ADLs?: Yes  (appropriate for developmental age) Is the patient deaf or have difficulty hearing?: No Does the patient have difficulty seeing, even when wearing glasses/contacts?: No Does the patient have difficulty concentrating, remembering, or making decisions?: No  Permission Sought/Granted Permission sought to share information with : Case Manager Permission granted to share information with : Yes, Verbal Permission Granted  Share Information with NAME: Case Manager     Permission granted to share info w Relationship: Arren Laminack (dtr) [phone] / Marval Batty (dtr) [phone]     Emotional Assessment Appearance:: Appears stated age Attitude/Demeanor/Rapport: Gracious Affect (typically observed): Accepting Orientation: : Oriented to Self, Oriented to Place, Oriented to  Time, Oriented to Situation Alcohol  / Substance Use: Not Applicable Psych Involvement: No (comment)  Admission diagnosis:  Rectal bleeding [K62.5] UGI bleed [K92.2] Hypotension due to hypovolemia [E86.1] Patient Active Problem List   Diagnosis Date Noted   GI bleed 01/19/2024   ABLA (acute blood loss anemia) 01/19/2024   Statin intolerance 08/22/2023   Gait disorder 02/19/2023   Abdominal pain 02/19/2023   CAD (coronary artery disease) 11/08/2022   DOE (dyspnea on exertion) 01/26/2021   Well adult exam 12/02/2020   Hip pain, acute, right 02/26/2020   Bladder neck obstruction 10/07/2019   Peripheral neuropathy 08/06/2019   Claudication (HCC) 08/06/2019   Neck pain 06/17/2018   Low back pain 06/17/2018   Paresthesia 06/17/2018   Esophageal stricture 06/17/2018   Rash 06/17/2018   Barrett's esophagus  with esophagitis 06/17/2018   Cerumen impaction 06/17/2018   Blood donor 06/17/2018   Essential hypertension 06/17/2018   Neoplasm of uncertain behavior of skin 06/17/2018   Actinic keratosis 06/17/2018   Status post right hip replacement 06/29/2014   PCP:  Garald Karlynn GAILS, MD Pharmacy:   [facility]  DR[facility] STORE [phone] [address] [place], [place] - [address] AT [facility] [address] [place] [address] Phone: [phone] Fax: [phone]     Social Drivers of Health (SDOH) Social History: SDOH Screenings   Food Insecurity: No Food Insecurity (01/20/2024)  Housing: Low Risk  (01/20/2024)  Transportation Needs: No Transportation Needs (01/20/2024)  Utilities: Not At Risk (01/20/2024)  Alcohol  Screen: Low Risk  (01/07/2024)  Depression (PHQ2-9): Low Risk  (01/07/2024)  Financial Resource Strain: Low Risk  (01/07/2024)  Physical Activity: Sufficiently Active (01/07/2024)  Social Connections: Moderately Isolated (01/19/2024)  Stress: No Stress Concern Present (01/07/2024)  Tobacco Use: Low Risk  (01/19/2024)  Health Literacy: Adequate Health Literacy (01/07/2024)   SDOH Interventions: Food Insecurity Interventions: Intervention Not Indicated, Inpatient TOC Housing Interventions: Intervention Not Indicated, Inpatient TOC Transportation Interventions: Intervention Not Indicated, Inpatient TOC Utilities Interventions: Intervention Not Indicated, Inpatient TOC   Readmission Risk Interventions     No data to display

## 2024-01-21 ENCOUNTER — Inpatient Hospital Stay (HOSPITAL_COMMUNITY)

## 2024-01-21 DIAGNOSIS — D62 Acute posthemorrhagic anemia: Secondary | ICD-10-CM

## 2024-01-21 DIAGNOSIS — I4891 Unspecified atrial fibrillation: Secondary | ICD-10-CM

## 2024-01-21 DIAGNOSIS — K921 Melena: Secondary | ICD-10-CM

## 2024-01-21 DIAGNOSIS — E861 Hypovolemia: Secondary | ICD-10-CM | POA: Diagnosis not present

## 2024-01-21 DIAGNOSIS — K625 Hemorrhage of anus and rectum: Secondary | ICD-10-CM | POA: Diagnosis not present

## 2024-01-21 LAB — CBC
HCT: 29.2 % — ABNORMAL LOW (ref 39.0–52.0)
Hemoglobin: 9.5 g/dL — ABNORMAL LOW (ref 13.0–17.0)
MCH: 30.8 pg (ref 26.0–34.0)
MCHC: 32.5 g/dL (ref 30.0–36.0)
MCV: 94.8 fL (ref 80.0–100.0)
Platelets: 187 K/uL (ref 150–400)
RBC: 3.08 MIL/uL — ABNORMAL LOW (ref 4.22–5.81)
RDW: 13.9 % (ref 11.5–15.5)
WBC: 7.2 K/uL (ref 4.0–10.5)
nRBC: 0 % (ref 0.0–0.2)

## 2024-01-21 LAB — TYPE AND SCREEN
ABO/RH(D): O POS
Antibody Screen: NEGATIVE
Unit division: 0

## 2024-01-21 LAB — TROPONIN I (HIGH SENSITIVITY)
Troponin I (High Sensitivity): 34 ng/L — ABNORMAL HIGH (ref <18)
Troponin I (High Sensitivity): 31 ng/L — ABNORMAL HIGH (ref <18)

## 2024-01-21 LAB — COMPREHENSIVE METABOLIC PANEL WITH GFR
ALT: 19 U/L (ref 0–44)
AST: 29 U/L (ref 15–41)
Albumin: 2.7 g/dL — ABNORMAL LOW (ref 3.5–5.0)
Alkaline Phosphatase: 69 U/L (ref 38–126)
Anion gap: 6 (ref 5–15)
BUN: 18 mg/dL (ref 8–23)
CO2: 21 mmol/L — ABNORMAL LOW (ref 22–32)
Calcium: 8 mg/dL — ABNORMAL LOW (ref 8.9–10.3)
Chloride: 112 mmol/L — ABNORMAL HIGH (ref 98–111)
Creatinine, Ser: 0.8 mg/dL (ref 0.61–1.24)
GFR, Estimated: >60 mL/min (ref >60)
Glucose, Bld: 103 mg/dL — ABNORMAL HIGH (ref 70–99)
Potassium: 3.4 mmol/L — ABNORMAL LOW (ref 3.5–5.1)
Sodium: 139 mmol/L (ref 135–145)
Total Bilirubin: 0.5 mg/dL (ref 0.0–1.2)
Total Protein: 5.2 g/dL — ABNORMAL LOW (ref 6.5–8.1)

## 2024-01-21 LAB — ECHOCARDIOGRAM COMPLETE
AV Mean grad: 4 mmHg
AV Peak grad: 7.6 mmHg
Ao pk vel: 1.38 m/s
Area-P 1/2: 3.26 cm2
Calc EF: 61.5 %
Height: 70 in
Single Plane A2C EF: 67.8 %
Single Plane A4C EF: 52.3 %
Weight: 3088 [oz_av]

## 2024-01-21 LAB — BPAM RBC
Blood Product Expiration Date: 202509182359
ISSUE DATE / TIME: 202508231628
Unit Type and Rh: 5100

## 2024-01-21 LAB — TSH: TSH: 2.442 u[IU]/mL (ref 0.350–4.500)

## 2024-01-21 MED ORDER — METOPROLOL TARTRATE 12.5 MG HALF TABLET
12.5000 mg | ORAL_TABLET | Freq: Two times a day (BID) | ORAL | Status: DC
  Administered 2024-01-21 – 2024-01-22 (×3): 12.5 mg via ORAL
  Filled 2024-01-21 (×3): qty 1

## 2024-01-21 MED ORDER — POTASSIUM CHLORIDE CRYS ER 20 MEQ PO TBCR
60.0000 meq | EXTENDED_RELEASE_TABLET | Freq: Once | ORAL | Status: AC
  Administered 2024-01-21: 60 meq via ORAL
  Filled 2024-01-21: qty 3

## 2024-01-21 NOTE — Plan of Care (Signed)
  Problem: Education: Goal: Knowledge of General Education information will improve Description: Including pain rating scale, medication(s)/side effects and non-pharmacologic comfort measures Outcome: Progressing   Problem: Clinical Measurements: Goal: Respiratory complications will improve Outcome: Progressing   Problem: Activity: Goal: Risk for activity intolerance will decrease Outcome: Progressing   Problem: Elimination: Goal: Will not experience complications related to urinary retention Outcome: Progressing   Problem: Clinical Measurements: Goal: Cardiovascular complication will be avoided Outcome: Not Progressing   Problem: Pain Managment: Goal: General experience of comfort will improve and/or be controlled Outcome: Not Progressing

## 2024-01-21 NOTE — Plan of Care (Addendum)
 Dicussed with the patient plan of care for the evening, pain management and bedtime medications with some teach back displayed.   Problem: Education: Goal: Knowledge of General Education information will improve Description: Including pain rating scale, medication(s)/side effects and non-pharmacologic comfort measures Outcome: Progressing    Problem: Health Behavior/Discharge Planning: Goal: Ability to manage health-related needs will improve Outcome: Progressing

## 2024-01-21 NOTE — Progress Notes (Signed)
  Progress Note   Patient: Mario Huerta FMW:[id number] DOB: [birth year] DOA: 01/19/2024     2 DOS: the patient was seen and examined on 01/21/2024   Brief hospital course: 83 y.o. male with medical history significant of osteoarthritis, food impaction of the esophagus, Barrett's esophagus with esophagitis, esophageal stricture, gastric ulcer, CAD, hypertension, bladder neck obstruction, peripheral neuropathy, presented to the emergency department with history of multiple episodes of dark red loose stools associated with dizziness, generalized weakness and nausea.  He was initially hypotensive when he arrived to the emergency department. He takes aspirin, but no other NSAIDs.  However, his medication profile showed prescriptions for ibuprofen and meloxicam .  He does not take anticoagulation.  No EtOH use.  No spicy foods.  No abdominal pain, vomiting or constipation.  He denied fever, chills, rhinorrhea, sore throat, wheezing or hemoptysis.  No chest pain, palpitations, diaphoresis, PND, orthopnea or pitting edema of the lower extremities.  No flank pain, dysuria, frequency or hematuria.  No polyuria, polydipsia, polyphagia or blurred vision.   Assessment and Plan: Principal Problem:   GI bleed With associated:   ABLA (acute blood loss anemia) Admit to stepdown/inpatient. Holding aspirin  Continue pantoprazole  40 mg every 12 hours.. GI consulted and is following, plan to repeat CTA if pt has signs of recurrent acure lower GI bleed Hgb stable thus far  New afib -Overnight events noted. Pt noted to have run of afib requiring cardizem  gtt, later went back to sinus this AM and cardizem  stopped -Pt remained asymptomatic -Possible PAfib vs provoked secondary to GI bleed vs acute illness -Cont on low dose metoprolol  -repeat 2d echo reviewed, unremarkable -TSH normal, serial trop unremarkable and trending down -Given above ABLA, would cont to hold off on anticoagulation -Recommend outpatient cardiac  monitor and f/u with Cardiology   Active Problems:   Esophageal stricture   Barrett's esophagus with esophagitis Continue PPI.     Essential hypertension Hold antihypertensives today.     CAD (coronary artery disease) Hold aspirin and amlodipine . He is intolerant to statins.       Subjective: Afib noted overnight requiring cardizem , now back in NSR and off cardizem   Physical Exam: Vitals:   01/21/24 1554 01/21/24 1600 01/21/24 1700 01/21/24 1800  BP:  (!) 155/75 (!) 140/91 (!) 143/94  Pulse: 69 72 68 76  Resp: 19 (!) 21 (!) 21 (!) 24  Temp: (!) 97.5 F (36.4 C)     TempSrc: Oral     SpO2: 97% 95% 97% 96%  Weight:      Height:       General exam: Conversant, in no acute distress Respiratory system: normal chest rise, clear, no audible wheezing Cardiovascular system: regular rhythm, s1-s2 Gastrointestinal system: Nondistended, nontender, pos BS Central nervous system: No seizures, no tremors Extremities: No cyanosis, no joint deformities Skin: No rashes, no pallor Psychiatry: Affect normal // no auditory hallucinations   Data Reviewed:  Labs reviewed: Na 139, K 3.4, Cr 0.80, WBC 7.2, Hgb 9.5, Plts 187  Family Communication: Pt in room, family at bedside  Disposition: Status is: Inpatient Remains inpatient appropriate because: severity of illness  Planned Discharge Destination: Home   Author: Garnette Pelt, MD 01/21/2024 6:11 PM  For on call review [email or web address].

## 2024-01-21 NOTE — Plan of Care (Signed)
  Problem: Clinical Measurements: Goal: Respiratory complications will improve Outcome: Progressing   Problem: Activity: Goal: Risk for activity intolerance will decrease Outcome: Progressing   Problem: Nutrition: Goal: Adequate nutrition will be maintained Outcome: Progressing   Problem: Pain Managment: Goal: General experience of comfort will improve and/or be controlled Outcome: Progressing   Problem: Safety: Goal: Ability to remain free from injury will improve Outcome: Progressing   Problem: Skin Integrity: Goal: Risk for impaired skin integrity will decrease Outcome: Progressing

## 2024-01-21 NOTE — Progress Notes (Addendum)
 [facility] Progress Note  CC: Lower GI bleed  Subjective: He is sitting up in the chair eating breakfast. He denies having any N/V. No upper or lower abdominal pain. He passed a small dark, almost black stool yesterday evening. No further BM, melena or blood per the rectum overnight or thus far this morning. He went into atrial fibrillation last night, started on Diltiazem  gtt. No CP, palpitations or SOB. Daughter at the bedside, she verified father stopped taking Meloxicam  01/2021. He remains on ASA 81mg  daily at home.    Objective:  Vital signs in last 24 hours: Temp:  [97.5 F (36.4 C)-98.4 F (36.9 C)] 98.4 F (36.9 C) (08/25 0358) Pulse Rate:  [69-79] 74 (08/25 0700) Resp:  [16-24] 20 (08/25 0700) BP: (101-159)/(61-102) 101/61 (08/25 0700) SpO2:  [94 %-98 %] 94 % (08/25 0700) Last BM Date : 01/19/24 General: 83 year old male sitting up in the chair in NAD. Heart: RRR, distant S1, S2. No murmurs.  Pulm: Breath sounds clear throughout.  Abdomen: Soft, nondistended. Nontender. Positive bowel sounds x 4 quadrants. No palpable mass.  Extremities:  No edema. Neurologic:  Alert and oriented x 4. Grossly normal neurologically. Psych:  Alert and cooperative. Normal mood and affect.  Intake/Output from previous day: 08/24 0701 - 08/25 0700 In: 274.8 [I.V.:274.8] Out: 2700 [Urine:2700] Intake/Output this shift: Total I/O In: 37.9 [I.V.:37.9] Out: -   Lab Results: Recent Labs    01/20/24 0227 01/20/24 2306 01/21/24 0241  WBC 5.6 7.4 7.2  HGB 8.5* 10.1* 9.5*  HCT 26.0* 30.3* 29.2*  PLT 159 178 187   BMET Recent Labs    01/20/24 0227 01/20/24 2306 01/21/24 0241  NA 142 138 139  K 3.5 3.7 3.4*  CL 115* 108 112*  CO2 22 22 21*  GLUCOSE 96 110* 103*  BUN 24* 17 18  CREATININE 0.67 0.82 0.80  CALCIUM  7.7* 8.2* 8.0*   LFT Recent Labs    01/21/24 0241  PROT 5.2*  ALBUMIN 2.7*  AST 29  ALT 19  ALKPHOS 69  BILITOT 0.5   PT/INR Recent Labs     01/19/24 0535  LABPROT 15.9*  INR 1.2   Hepatitis Panel No results for input(s): HEPBSAG, HCVAB, HEPAIGM, HEPBIGM in the last 72 hours.  CT ANGIO GI BLEED Result Date: 01/19/2024 CLINICAL DATA:  Hypotension. Rectal bleeding. Nausea dizziness and generalized weakness. EXAM: CTA ABDOMEN AND PELVIS WITHOUT AND WITH CONTRAST TECHNIQUE: Multidetector CT imaging of the abdomen and pelvis was performed using the standard protocol during bolus administration of intravenous contrast. Multiplanar reconstructed images and MIPs were obtained and reviewed to evaluate the vascular anatomy. RADIATION DOSE REDUCTION: This exam was performed according to the departmental dose-optimization program which includes automated exposure control, adjustment of the mA and/or kV according to patient size and/or use of iterative reconstruction technique. CONTRAST:  OMNIPAQUE  IOHEXOL  350 MG/ML SOLN COMPARISON:  None Available. FINDINGS: VASCULAR Aorta: Moderate atherosclerotic wall calcification.Normal caliber aorta without aneurysm, dissection, vasculitis or significant stenosis. Celiac: Patent without evidence of aneurysm, dissection, vasculitis or significant stenosis. SMA: Patent without evidence of aneurysm, dissection, vasculitis or significant stenosis. Renals: Both renal arteries are patent without evidence of aneurysm, dissection, vasculitis, fibromuscular dysplasia or significant stenosis. Accessory right renal artery. IMA: Patent without evidence of aneurysm, dissection, vasculitis or significant stenosis.1 Inflow: Patent without evidence of aneurysm, dissection, vasculitis or significant stenosis. Proximal Outflow: Bilateral common femoral and visualized portions of the superficial and profunda femoral arteries are patent without  evidence of aneurysm, dissection, vasculitis or significant stenosis. Veins: No obvious venous abnormality within the limitations of this arterial phase study. Review of the MIP  images confirms the above findings. NON-VASCULAR Lower chest: Dependent atelectasis noted right lower lobe. Hiatal hernia contains mainly fat. Hepatobiliary: A tiny hypodensity in the liver parenchyma is too small to characterize but is statistically most likely benign. No followup imaging is recommended. No suspicious focal abnormality within the liver parenchyma. There is no evidence for gallstones, gallbladder wall thickening, or pericholecystic fluid. No intrahepatic or extrahepatic biliary dilation. Pancreas: No focal mass lesion. No dilatation of the main duct. No intraparenchymal cyst. No peripancreatic edema. Spleen: No splenomegaly. No suspicious focal mass lesion. Adrenals/Urinary Tract: No adrenal nodule or mass. Tiny well-defined homogeneous low-density lesions in both kidneys are too small to characterize but are statistically most likely benign and probably cysts. No followup imaging is recommended. Mild fullness noted in both intrarenal collecting systems. No hydroureter. The urinary bladder appears normal for the degree of distention. Stomach/Bowel: No evidence for contrast extravasation into the gastric lumen to suggest active hemorrhage. No findings to suggest vascular lesion. Duodenum is normally positioned as is the ligament of Treitz. No contrast extravasation in the duodenal lumen. No small bowel wall thickening. No small bowel dilatation. No intraluminal contrast extravasation in the small bowel loops. Mobile cecum is positioned in the left lower abdomen. The terminal ileum is normal. The appendix is not well visualized, but there is no edema or inflammation in the region of the cecal tip to suggest appendicitis. No definite evidence for contrast extravasation into the colon lumen to suggest active or ongoing bleeding. There is a focus of high attenuation noted in the splenic flexure (axial 38/15) but this is in a region of substantial motion artifact and no accumulation of contrast material  in this region on more delayed imaging to suggest ongoing hemorrhage. Diverticular changes are noted in the left colon without evidence of diverticulitis. Lymphatic: There is no gastrohepatic or hepatoduodenal ligament lymphadenopathy. No retroperitoneal or mesenteric lymphadenopathy. No pelvic sidewall lymphadenopathy. Reproductive: The prostate gland and seminal vesicles are unremarkable. Other: No intraperitoneal free fluid. Musculoskeletal: Right hip replacement1. No worrisome lytic or sclerotic osseous abnormality. IMPRESSION: 1. No definite evidence for contrast extravasation into the gastric lumen, duodenum, small bowel, or colon lumen to suggest active or ongoing bleeding. 2. No acute findings in the abdomen or pelvis. 3. Left colonic diverticulosis without diverticulitis. 4.  Aortic Atherosclerosis (ICD10-I70.0). Electronically Signed   By: Camellia Candle M.D.   On: 01/19/2024 08:02   Patient profile: 83 year old male with a history of asthma, hypertension, coronary artery disease per CTA, Barrett's esophagus, erosive esophagitis, esophageal ulcers, esophageal stricture with prior food bolus impaction 2019 and bladder neck obstruction. Admitted 01/19/2024 with a lower GI bleed.   Assessment / Plan:  83 year old admitted 01/19/2024 with painless hematochezia/lower GI bleed, diverticular bleed suspected. On ASA 81mg  every day at home. Admission Hg 10.2 (Hg 14.5 five months ago) -> 8.6 -> transfused one unit of PRBCs -> post transfusion Hg 9.6 -> Hg 8.5 -> 10.1 -> today Hg 9.5. CTA negative for active GI bleed, showed diverticulosis in the left colon without evidence of diverticulitis. No further maroon or bright red hematochezia overnight. He passed a small solid black stool yesterday evening.  -Continue soft diet as tolerated  -Continue to monitor patient closely for active GI bleed -CTA if patient demonstrates brisk GI bleeding -CBC in am -Transfuse for Hg < 8 -Consider a colonoscopy  as an  outpatient -Await further recommendations per Dr. Legrand  Acute blood loss anemia, secondary to lower GI bleed -See plan above  GERD, Barrett's esophagus and an esophageal stricture. On PPI IV bid. Asymptomatic.  -Change Pantoprazole  40mg  IV to po bid at time of discharge   CAD. LVEF 50 - 55% per ECHO 04/2021.   New onset atrial fibrillation last night, in NSR this morning. On Diltiazem  infusion. Magnesium level 2.0.  -Management per the hospitalist -Consider cardiology consult   Mild hypokalemia. K+ 3.4. - Management per the hospitalist  Principal Problem:   GI bleed Active Problems:   Esophageal stricture   Barrett's esophagus with esophagitis   Essential hypertension   CAD (coronary artery disease)   ABLA (acute blood loss anemia)     LOS: 2 days   Elida CHRISTELLA Shawl  01/21/2024, 8:29 AM  I have taken an interval history, thoroughly reviewed the chart and examined the patient. I agree with the Advanced Practitioner's note, impression and recommendations, and have recorded additional findings, impressions and recommendations below. I performed a substantive portion of this encounter (>50% time spent), including a complete performance of the medical decision making.  My additional thoughts are as follows:  Signout received from colleagues and additional chart review performed.  He is back in sinus rhythm after having A-fib overnight.  Sitting/laying comfortably in bedside recliner, vitals normal in sinus rhythm right now cardiopulmonary and abdominal exams benign  Hemoglobin fluctuating some, but no overt bleeding over 24 hours now.  Still having some small dark stools which we will continue another couple of days.  Therefore, he does not appear to have recurrence of the overt lower GI bleeding which we believed to be diverticular in nature. The A-fib appears to have been situational for physiologic stress and he has converted back to sinus rhythm and currently off  Cardizem  drip.  Hopefully this will not mean consideration of long-term anticoagulation. Continue conservative management for now.  He is tolerating regular diet well and we will continue that. Continued hospital observation for least tomorrow, CBC tomorrow morning _________________  This consultation required a moderate degree of medical decision making due to the nature and complexity of the acute condition(s) being evaluated as well as the patient's medical comorbidities.  Victory LITTIE Legrand III Office:[phone]

## 2024-01-22 ENCOUNTER — Telehealth: Payer: Self-pay | Admitting: Cardiology

## 2024-01-22 ENCOUNTER — Inpatient Hospital Stay

## 2024-01-22 ENCOUNTER — Other Ambulatory Visit (HOSPITAL_COMMUNITY): Payer: Self-pay

## 2024-01-22 ENCOUNTER — Telehealth: Payer: Self-pay | Admitting: Nurse Practitioner

## 2024-01-22 DIAGNOSIS — E861 Hypovolemia: Secondary | ICD-10-CM | POA: Diagnosis not present

## 2024-01-22 DIAGNOSIS — I480 Paroxysmal atrial fibrillation: Secondary | ICD-10-CM

## 2024-01-22 DIAGNOSIS — K625 Hemorrhage of anus and rectum: Secondary | ICD-10-CM | POA: Diagnosis not present

## 2024-01-22 LAB — BASIC METABOLIC PANEL WITH GFR
Anion gap: 6 (ref 5–15)
BUN: 17 mg/dL (ref 8–23)
CO2: 22 mmol/L (ref 22–32)
Calcium: 8 mg/dL — ABNORMAL LOW (ref 8.9–10.3)
Chloride: 112 mmol/L — ABNORMAL HIGH (ref 98–111)
Creatinine, Ser: 0.9 mg/dL (ref 0.61–1.24)
GFR, Estimated: >60 mL/min (ref >60)
Glucose, Bld: 103 mg/dL — ABNORMAL HIGH (ref 70–99)
Potassium: 4 mmol/L (ref 3.5–5.1)
Sodium: 140 mmol/L (ref 135–145)

## 2024-01-22 LAB — CBC
HCT: 27.6 % — ABNORMAL LOW (ref 39.0–52.0)
Hemoglobin: 9.2 g/dL — ABNORMAL LOW (ref 13.0–17.0)
MCH: 31.9 pg (ref 26.0–34.0)
MCHC: 33.3 g/dL (ref 30.0–36.0)
MCV: 95.8 fL (ref 80.0–100.0)
Platelets: 188 K/uL (ref 150–400)
RBC: 2.88 MIL/uL — ABNORMAL LOW (ref 4.22–5.81)
RDW: 14.2 % (ref 11.5–15.5)
WBC: 4.3 K/uL (ref 4.0–10.5)
nRBC: 0 % (ref 0.0–0.2)

## 2024-01-22 MED ORDER — PANTOPRAZOLE SODIUM 40 MG PO TBEC: 40.0000 mg | DELAYED_RELEASE_TABLET | Freq: Two times a day (BID) | ORAL | Status: DC

## 2024-01-22 MED ORDER — METOPROLOL TARTRATE 25 MG PO TABS
12.5000 mg | ORAL_TABLET | Freq: Two times a day (BID) | ORAL | 0 refills | Status: DC
  Filled 2024-01-22: qty 30, 30d supply, fill #0

## 2024-01-22 NOTE — Telephone Encounter (Signed)
 Linda/Dottie, patient will be discharged home from the hospital today. Pls contact patient and send him to our lab in one week for a BMP and CBC.  Christy, pls contact patient and schedule him for a follow up appointment in 3 to 4 weeks with Dr. Nandigam or [facility] APP.

## 2024-01-22 NOTE — Telephone Encounter (Signed)
 Called and spoke with patient's daughter.  Patient is scheduled with Delon Failing, PA on 03/07/24.  Appt confirmed.

## 2024-01-22 NOTE — Progress Notes (Unsigned)
Enrolled patient for a 14 day Zio XT monitor to be mailed to patients home  Mario Huerta to read

## 2024-01-22 NOTE — Telephone Encounter (Signed)
 HI [name] 2 week non live monitor for afib please. Dr. Loni to read.  Here with GI bleed and had brief episode of afib.

## 2024-01-22 NOTE — Progress Notes (Addendum)
 [facility] Progress Note  CC:  Lower GI bleed   Subjective: He is sitting up in the chair, daughter at the bedside. He is tolerating a soft diet. No nausea or vomiting. No abdominal pain. He passed a darker brown stool at 1:30 AM. No further black stools or hematochezia. No chest pain, dizziness or shortness of breath. He endorses having a slight nonproductive cough.    Objective:  Vital signs in last 24 hours: Temp:  [97.5 F (36.4 C)-98.8 F (37.1 C)] 98.8 F (37.1 C) (08/26 0300) Pulse Rate:  [62-89] 82 (08/26 0500) Resp:  [13-29] 17 (08/26 0500) BP: (94-155)/(53-94) 107/53 (08/26 0500) SpO2:  [93 %-98 %] 93 % (08/26 0500) Last BM Date : 01/21/24 General: 83 year old male in no acute distress. Heart: Regular rate and rhythm, no murmurs. Pulm: Breath sounds clear throughout. Abdomen: Soft, nondistended.  Nontender.  Positive bowel sounds to all 4 quadrants. Extremities: No lower extremity edema. Neurologic:  Alert and oriented x 4.  Moves all extremities equally.  Speech is clear. Psych:  Alert and cooperative. Normal mood and affect.  Intake/Output from previous day: 08/25 0701 - 08/26 0700 In: 766 [P.O.:720; I.V.:46] Out: 2075 [Urine:2075] Intake/Output this shift: No intake/output data recorded.  Lab Results: Recent Labs    01/20/24 2306 01/21/24 0241 01/22/24 0328  WBC 7.4 7.2 4.3  HGB 10.1* 9.5* 9.2*  HCT 30.3* 29.2* 27.6*  PLT 178 187 188   BMET Recent Labs    01/20/24 2306 01/21/24 0241 01/22/24 0328  NA 138 139 140  K 3.7 3.4* 4.0  CL 108 112* 112*  CO2 22 21* 22  GLUCOSE 110* 103* 103*  BUN 17 18 17   CREATININE 0.82 0.80 0.90  CALCIUM  8.2* 8.0* 8.0*   LFT Recent Labs    01/21/24 0241  PROT 5.2*  ALBUMIN 2.7*  AST 29  ALT 19  ALKPHOS 69  BILITOT 0.5   PT/INR No results for input(s): LABPROT, INR in the last 72 hours. Hepatitis Panel No results for input(s): HEPBSAG, HCVAB, HEPAIGM, HEPBIGM in the last  72 hours.  ECHOCARDIOGRAM COMPLETE Result Date: 01/21/2024    ECHOCARDIOGRAM REPORT   Patient Name:   DECODA VAN Date of Exam: 01/21/2024 Medical Rec #:  [id number]       Height:       70.0 in Accession #:    [id number]      Weight:       193.0 lb Date of Birth:  1941-01-11       BSA:          2.056 m Patient Age:    83 years        BP:           113/58 mmHg Patient Gender: M               HR:           69 bpm. Exam Location:  Inpatient Procedure: 2D Echo, Cardiac Doppler and Color Doppler (Both Spectral and Color            Flow Doppler were utilized during procedure). Indications:    Afib  History:        Patient has prior history of Echocardiogram examinations, most                 recent 05/09/2021. CAD; Risk Factors:Hypertension.  Sonographer:    Therisa Crouch Referring Phys: [phone] STEPHEN K CHIU  Sonographer Comments: No parasternal window. IMPRESSIONS  1. Left ventricular ejection fraction, by estimation, is 60 to 65%. The left ventricle has normal function. The left ventricle has no regional wall motion abnormalities. Left ventricular diastolic parameters were normal.  2. Right ventricular systolic function is normal. The right ventricular size is normal.  3. Left atrial size was moderately dilated.  4. The mitral valve is normal in structure. Trivial mitral valve regurgitation. No evidence of mitral stenosis.  5. The aortic valve was not well visualized. Aortic valve regurgitation is not visualized.  6. The inferior vena cava is normal in size with greater than 50% respiratory variability, suggesting right atrial pressure of 3 mmHg. FINDINGS  Left Ventricle: Left ventricular ejection fraction, by estimation, is 60 to 65%. The left ventricle has normal function. The left ventricle has no regional wall motion abnormalities. The left ventricular internal cavity size was normal in size. There is  no left ventricular hypertrophy. Left ventricular diastolic parameters were normal. Normal left ventricular  filling pressure. Right Ventricle: The right ventricular size is normal. No increase in right ventricular wall thickness. Right ventricular systolic function is normal. Left Atrium: Left atrial size was moderately dilated. Right Atrium: Right atrial size was normal in size. Pericardium: There is no evidence of pericardial effusion. Mitral Valve: The mitral valve is normal in structure. Trivial mitral valve regurgitation. No evidence of mitral valve stenosis. Tricuspid Valve: The tricuspid valve is normal in structure. Tricuspid valve regurgitation is not demonstrated. No evidence of tricuspid stenosis. Aortic Valve: The aortic valve was not well visualized. Aortic valve regurgitation is not visualized. Aortic valve mean gradient measures 4.0 mmHg. Aortic valve peak gradient measures 7.6 mmHg. Pulmonic Valve: The pulmonic valve was normal in structure. Pulmonic valve regurgitation is not visualized. No evidence of pulmonic stenosis. Aorta: The aortic root is normal in size and structure. Venous: The inferior vena cava is normal in size with greater than 50% respiratory variability, suggesting right atrial pressure of 3 mmHg. IAS/Shunts: No atrial level shunt detected by color flow Doppler.   LV Volumes (MOD) LV vol d, MOD A2C: 70.9 ml Diastology LV vol d, MOD A4C: 84.3 ml LV e' medial:    9.25 cm/s LV vol s, MOD A2C: 22.8 ml LV E/e' medial:  6.0 LV vol s, MOD A4C: 40.2 ml LV e' lateral:   11.50 cm/s LV SV MOD A2C:     48.1 ml LV E/e' lateral: 4.8 LV SV MOD A4C:     84.3 ml LV SV MOD BP:      48.4 ml RIGHT VENTRICLE RV Basal diam:  3.80 cm RV S prime:     10.00 cm/s TAPSE (M-mode): 2.1 cm LEFT ATRIUM             Index LA Vol (A2C):   98.4 ml 47.86 ml/m LA Vol (A4C):   76.4 ml 37.16 ml/m LA Biplane Vol: 87.8 ml 42.71 ml/m  AORTIC VALVE AV Vmax:           138.00 cm/s AV Vmean:          89.500 cm/s AV VTI:            0.235 m AV Peak Grad:      7.6 mmHg AV Mean Grad:      4.0 mmHg LVOT Vmax:         97.30 cm/s LVOT  Vmean:        66.700 cm/s LVOT VTI:          0.191 m LVOT/AV VTI ratio: 0.81  MITRAL VALVE MV Area (PHT): 3.26 cm    SHUNTS MV Decel Time: 233 msec    Systemic VTI: 0.19 m MV E velocity: 55.30 cm/s MV A velocity: 62.10 cm/s MV E/A ratio:  0.89 Wilbert Bihari MD Electronically signed by Wilbert Bihari MD Signature Date/Time: 01/21/2024/4:45:28 PM    Final    Patient profile: 83 year old male with a history of asthma, hypertension, coronary artery disease per CTA, Barrett's esophagus, erosive esophagitis, esophageal ulcers, esophageal stricture with prior food bolus impaction 2019 and bladder neck obstruction. Admitted 01/19/2024 with a lower GI bleed.   Assessment / Plan:  83 year old admitted 01/19/2024 with painless hematochezia/lower GI bleed, diverticular bleed suspected. On ASA 81mg  every day at home. Admission Hg 10.2 (Hg 14.5 five months ago) -> 8.6 -> transfused one unit of PRBCs -> post transfusion Hg 9.6 -> Hg 8.5 -> 10.1 -> Hg 9.5 -> today Hg 9.2. CTA negative for active GI bleed, showed diverticulosis in the left colon without evidence of diverticulitis. No further black stools or hematochezia overnight, passed a dark brown BM at 1:30 am. Hemodynamically stable.  -Continue soft diet as tolerated  -Continue to monitor patient closely for active GI bleed -CTA if patient demonstrates brisk GI bleeding -Transfuse for Hg < 8 -Consider a colonoscopy as an outpatient -Our office will contact the patient to facilitate labs in 1 week post discharge and follow-up appointment with Dr. Shila or APP in 3 to 4 weeks -Addendum: Ok to discharge home from GI perspective. Restart ASA 81mg  every day in one week.    Acute blood loss anemia, secondary to lower GI bleed -See plan above   GERD, Barrett's esophagus and an esophageal stricture. On PPI IV bid. Asymptomatic.  -Change Pantoprazole  40mg  IV to po bid at time of discharge    CAD. LVEF 60 - 65% per ECHO 01/21/2024. No angina.    New onset atrial  fibrillation overnight 8/24 - 8/25, briefly on Diltiazem  infusion then converted back to NSR. Transitioned to oral Metoprolol . K+ 3.4, received KCL 60meq po x 1 -> today K+ 4.0. Magnesium level 2.0. Troponin levels 34 -> 31 on 8/25. -Management per the hospitalist, recommended outpatient cardiology follow up       Principal Problem:   GI bleed Active Problems:   Esophageal stricture   Barrett's esophagus with esophagitis   Essential hypertension   CAD (coronary artery disease)   ABLA (acute blood loss anemia)     LOS: 3 days   Elida CHRISTELLA Shawl  01/22/2024, 8:48 AM

## 2024-01-22 NOTE — Progress Notes (Signed)
 Discharge medication delivered to patient at bedside [facility]

## 2024-01-22 NOTE — Discharge Summary (Signed)
 Physician Discharge Summary   Patient: Mario Huerta MRN: [id number] DOB: [birth year]  Admit date:     01/19/2024  Discharge date: 01/22/24  Discharge Physician: Garnette Pelt   PCP: Garald Karlynn GAILS, MD   Recommendations at discharge:    Follow up with PCP in 2-3 weeks Follow up with GI as scheduled in 3-4 weeks Follow up with Cardiology as will be scheduled  Discharge Diagnoses: Principal Problem:   GI bleed Active Problems:   Esophageal stricture   Barrett's esophagus with esophagitis   Essential hypertension   CAD (coronary artery disease)   ABLA (acute blood loss anemia)  Resolved Problems:   * No resolved hospital problems. *  Hospital Course: 83 y.o. male with medical history significant of osteoarthritis, food impaction of the esophagus, Barrett's esophagus with esophagitis, esophageal stricture, gastric ulcer, CAD, hypertension, bladder neck obstruction, peripheral neuropathy, presented to the emergency department with history of multiple episodes of dark red loose stools associated with dizziness, generalized weakness and nausea.  He was initially hypotensive when he arrived to the emergency department. He takes aspirin, but no other NSAIDs.  However, his medication profile showed prescriptions for ibuprofen and meloxicam .  He does not take anticoagulation.  No EtOH use.  No spicy foods.  No abdominal pain, vomiting or constipation.  He denied fever, chills, rhinorrhea, sore throat, wheezing or hemoptysis.  No chest pain, palpitations, diaphoresis, PND, orthopnea or pitting edema of the lower extremities.  No flank pain, dysuria, frequency or hematuria.  No polyuria, polydipsia, polyphagia or blurred vision.   Assessment and Plan: Principal Problem:   GI bleed With associated:   ABLA (acute blood loss anemia) Admit to stepdown/inpatient. GI following Held ASA this admit, per GI can resume in 1 week Continue pantoprazole  40 mg every 12 hours.. S/p 1 unit PRBC this  admit. Hgb remained stable the remainder of the    New afib -Overnight events noted. Pt noted to have run of afib requiring cardizem  gtt, later went back to sinus this AM and cardizem  stopped -Pt remained asymptomatic -Possible PAfib vs provoked secondary to GI bleed vs acute illness -Cont on low dose metoprolol  -repeat 2d echo reviewed, unremarkable -TSH normal, serial trop unremarkable and trending down -Given above ABLA, would cont to hold off on anticoagulation -Recommend outpatient cardiac monitor and f/u with Cardiology, discussed with Cardiology who will arrange   Active Problems:   Esophageal stricture   Barrett's esophagus with esophagitis Continue PPI.     Essential hypertension Hold antihypertensives today.     CAD (coronary artery disease) Held aspirin and amlodipine . He is intolerant to statins. Started metoprolol  per above OK to resume ASA in 1 week per GI       Consultants: GI Procedures performed:   Disposition: Home Diet recommendation:  Regular diet DISCHARGE MEDICATION: Allergies as of 01/22/2024       Reactions   Codeine Itching, Nausea And Vomiting   Gabapentin  Swelling, Other (See Comments)   Made the feet swell   Hydrocodone Nausea And Vomiting   Oxycodone Nausea And Vomiting   Tramadol Itching, Nausea And Vomiting        Medication List     PAUSE taking these medications    aspirin EC 81 MG tablet Wait to take this until: January 29, 2024 Take 81 mg by mouth daily.       STOP taking these medications    amLODipine  5 MG tablet Commonly known as: NORVASC    meloxicam  7.5 MG tablet Commonly known  as: MOBIC        TAKE these medications    APPLE CIDER VINEGAR PO Take by mouth.   Centrum Silver 50+Men Tabs Take 1 tablet by mouth daily with breakfast.   finasteride  5 MG tablet Commonly known as: PROSCAR  TAKE 1 TABLET(5 MG) BY MOUTH DAILY   Fish Oil 1000 MG Caps Take 1,000 mg by mouth daily.   ibuprofen 600 MG  tablet Commonly known as: ADVIL Take 600 mg by mouth every 6 (six) hours as needed.   lisinopril -hydrochlorothiazide  20-12.5 MG tablet Commonly known as: ZESTORETIC  TAKE 1 TABLET BY MOUTH EVERY DAY   metoprolol  tartrate 25 MG tablet Commonly known as: LOPRESSOR  Take 0.5 tablets (12.5 mg total) by mouth 2 (two) times daily.   pantoprazole  40 MG tablet Commonly known as: PROTONIX  Take 1 tablet (40 mg total) by mouth 2 (two) times daily. What changed: See the new instructions.   TRIPLE FLEX PO Take 1 tablet by mouth daily.   VITAMIN E PO Take 1 capsule by mouth daily.        Follow-up Information     Plotnikov, Karlynn GAILS, MD Follow up in 2 week(s).   Specialty: Internal Medicine Why: Hospital follow up Contact information: [address] [place] [address] [phone]         Shila Gustav GAILS, MD Follow up in 3 week(s).   Specialty: Gastroenterology Why: Hospital follow up Contact information: [address] [place] [address] [phone]         Loni Soyla LABOR, MD Follow up.   Specialties: Cardiology, Radiology Why: Hospital follow up, as scheduled Contact information: [address] [place] [address] [phone]                Discharge Exam: Fredricka Weights   01/19/24 0549  Weight: 87.5 kg   General exam: Awake, laying in bed, in nad Respiratory system: Normal respiratory effort, no wheezing Cardiovascular system: regular rate, s1, s2 Gastrointestinal system: Soft, nondistended, positive BS Central nervous system: CN2-12 grossly intact, strength intact Extremities: Perfused, no clubbing Skin: Normal skin turgor, no notable skin lesions seen Psychiatry: Mood normal // no visual hallucinations   Condition at discharge: fair  The results of significant diagnostics from this hospitalization (including imaging, microbiology, ancillary and laboratory) are listed below for reference.   Imaging  Studies: ECHOCARDIOGRAM COMPLETE Result Date: 01/21/2024    ECHOCARDIOGRAM REPORT   Patient Name:   LAWYER WASHABAUGH Date of Exam: 01/21/2024 Medical Rec #:  [id number]       Height:       70.0 in Accession #:    [id number]      Weight:       193.0 lb Date of Birth:  28-Oct-1940       BSA:          2.056 m Patient Age:    83 years        BP:           113/58 mmHg Patient Gender: M               HR:           69 bpm. Exam Location:  Inpatient Procedure: 2D Echo, Cardiac Doppler and Color Doppler (Both Spectral and Color            Flow Doppler were utilized during procedure). Indications:    Afib  History:        Patient has prior history of Echocardiogram examinations, most  recent 05/09/2021. CAD; Risk Factors:Hypertension.  Sonographer:    Therisa Crouch Referring Phys: [phone] Ileah Falkenstein K Lukasz Rogus  Sonographer Comments: No parasternal window. IMPRESSIONS  1. Left ventricular ejection fraction, by estimation, is 60 to 65%. The left ventricle has normal function. The left ventricle has no regional wall motion abnormalities. Left ventricular diastolic parameters were normal.  2. Right ventricular systolic function is normal. The right ventricular size is normal.  3. Left atrial size was moderately dilated.  4. The mitral valve is normal in structure. Trivial mitral valve regurgitation. No evidence of mitral stenosis.  5. The aortic valve was not well visualized. Aortic valve regurgitation is not visualized.  6. The inferior vena cava is normal in size with greater than 50% respiratory variability, suggesting right atrial pressure of 3 mmHg. FINDINGS  Left Ventricle: Left ventricular ejection fraction, by estimation, is 60 to 65%. The left ventricle has normal function. The left ventricle has no regional wall motion abnormalities. The left ventricular internal cavity size was normal in size. There is  no left ventricular hypertrophy. Left ventricular diastolic parameters were normal. Normal left ventricular filling  pressure. Right Ventricle: The right ventricular size is normal. No increase in right ventricular wall thickness. Right ventricular systolic function is normal. Left Atrium: Left atrial size was moderately dilated. Right Atrium: Right atrial size was normal in size. Pericardium: There is no evidence of pericardial effusion. Mitral Valve: The mitral valve is normal in structure. Trivial mitral valve regurgitation. No evidence of mitral valve stenosis. Tricuspid Valve: The tricuspid valve is normal in structure. Tricuspid valve regurgitation is not demonstrated. No evidence of tricuspid stenosis. Aortic Valve: The aortic valve was not well visualized. Aortic valve regurgitation is not visualized. Aortic valve mean gradient measures 4.0 mmHg. Aortic valve peak gradient measures 7.6 mmHg. Pulmonic Valve: The pulmonic valve was normal in structure. Pulmonic valve regurgitation is not visualized. No evidence of pulmonic stenosis. Aorta: The aortic root is normal in size and structure. Venous: The inferior vena cava is normal in size with greater than 50% respiratory variability, suggesting right atrial pressure of 3 mmHg. IAS/Shunts: No atrial level shunt detected by color flow Doppler.   LV Volumes (MOD) LV vol d, MOD A2C: 70.9 ml Diastology LV vol d, MOD A4C: 84.3 ml LV e' medial:    9.25 cm/s LV vol s, MOD A2C: 22.8 ml LV E/e' medial:  6.0 LV vol s, MOD A4C: 40.2 ml LV e' lateral:   11.50 cm/s LV SV MOD A2C:     48.1 ml LV E/e' lateral: 4.8 LV SV MOD A4C:     84.3 ml LV SV MOD BP:      48.4 ml RIGHT VENTRICLE RV Basal diam:  3.80 cm RV S prime:     10.00 cm/s TAPSE (M-mode): 2.1 cm LEFT ATRIUM             Index LA Vol (A2C):   98.4 ml 47.86 ml/m LA Vol (A4C):   76.4 ml 37.16 ml/m LA Biplane Vol: 87.8 ml 42.71 ml/m  AORTIC VALVE AV Vmax:           138.00 cm/s AV Vmean:          89.500 cm/s AV VTI:            0.235 m AV Peak Grad:      7.6 mmHg AV Mean Grad:      4.0 mmHg LVOT Vmax:         97.30 cm/s LVOT Vmean:  66.700 cm/s LVOT VTI:          0.191 m LVOT/AV VTI ratio: 0.81 MITRAL VALVE MV Area (PHT): 3.26 cm    SHUNTS MV Decel Time: 233 msec    Systemic VTI: 0.19 m MV E velocity: 55.30 cm/s MV A velocity: 62.10 cm/s MV E/A ratio:  0.89 Wilbert Bihari MD Electronically signed by Wilbert Bihari MD Signature Date/Time: 01/21/2024/4:45:28 PM    Final    CT ANGIO GI BLEED Result Date: 01/19/2024 CLINICAL DATA:  Hypotension. Rectal bleeding. Nausea dizziness and generalized weakness. EXAM: CTA ABDOMEN AND PELVIS WITHOUT AND WITH CONTRAST TECHNIQUE: Multidetector CT imaging of the abdomen and pelvis was performed using the standard protocol during bolus administration of intravenous contrast. Multiplanar reconstructed images and MIPs were obtained and reviewed to evaluate the vascular anatomy. RADIATION DOSE REDUCTION: This exam was performed according to the departmental dose-optimization program which includes automated exposure control, adjustment of the mA and/or kV according to patient size and/or use of iterative reconstruction technique. CONTRAST:  OMNIPAQUE  IOHEXOL  350 MG/ML SOLN COMPARISON:  None Available. FINDINGS: VASCULAR Aorta: Moderate atherosclerotic wall calcification.Normal caliber aorta without aneurysm, dissection, vasculitis or significant stenosis. Celiac: Patent without evidence of aneurysm, dissection, vasculitis or significant stenosis. SMA: Patent without evidence of aneurysm, dissection, vasculitis or significant stenosis. Renals: Both renal arteries are patent without evidence of aneurysm, dissection, vasculitis, fibromuscular dysplasia or significant stenosis. Accessory right renal artery. IMA: Patent without evidence of aneurysm, dissection, vasculitis or significant stenosis.1 Inflow: Patent without evidence of aneurysm, dissection, vasculitis or significant stenosis. Proximal Outflow: Bilateral common femoral and visualized portions of the superficial and profunda femoral arteries are patent  without evidence of aneurysm, dissection, vasculitis or significant stenosis. Veins: No obvious venous abnormality within the limitations of this arterial phase study. Review of the MIP images confirms the above findings. NON-VASCULAR Lower chest: Dependent atelectasis noted right lower lobe. Hiatal hernia contains mainly fat. Hepatobiliary: A tiny hypodensity in the liver parenchyma is too small to characterize but is statistically most likely benign. No followup imaging is recommended. No suspicious focal abnormality within the liver parenchyma. There is no evidence for gallstones, gallbladder wall thickening, or pericholecystic fluid. No intrahepatic or extrahepatic biliary dilation. Pancreas: No focal mass lesion. No dilatation of the main duct. No intraparenchymal cyst. No peripancreatic edema. Spleen: No splenomegaly. No suspicious focal mass lesion. Adrenals/Urinary Tract: No adrenal nodule or mass. Tiny well-defined homogeneous low-density lesions in both kidneys are too small to characterize but are statistically most likely benign and probably cysts. No followup imaging is recommended. Mild fullness noted in both intrarenal collecting systems. No hydroureter. The urinary bladder appears normal for the degree of distention. Stomach/Bowel: No evidence for contrast extravasation into the gastric lumen to suggest active hemorrhage. No findings to suggest vascular lesion. Duodenum is normally positioned as is the ligament of Treitz. No contrast extravasation in the duodenal lumen. No small bowel wall thickening. No small bowel dilatation. No intraluminal contrast extravasation in the small bowel loops. Mobile cecum is positioned in the left lower abdomen. The terminal ileum is normal. The appendix is not well visualized, but there is no edema or inflammation in the region of the cecal tip to suggest appendicitis. No definite evidence for contrast extravasation into the colon lumen to suggest active or ongoing  bleeding. There is a focus of high attenuation noted in the splenic flexure (axial 38/15) but this is in a region of substantial motion artifact and no accumulation of contrast material in this region  on more delayed imaging to suggest ongoing hemorrhage. Diverticular changes are noted in the left colon without evidence of diverticulitis. Lymphatic: There is no gastrohepatic or hepatoduodenal ligament lymphadenopathy. No retroperitoneal or mesenteric lymphadenopathy. No pelvic sidewall lymphadenopathy. Reproductive: The prostate gland and seminal vesicles are unremarkable. Other: No intraperitoneal free fluid. Musculoskeletal: Right hip replacement1. No worrisome lytic or sclerotic osseous abnormality. IMPRESSION: 1. No definite evidence for contrast extravasation into the gastric lumen, duodenum, small bowel, or colon lumen to suggest active or ongoing bleeding. 2. No acute findings in the abdomen or pelvis. 3. Left colonic diverticulosis without diverticulitis. 4.  Aortic Atherosclerosis (ICD10-I70.0). Electronically Signed   By: Camellia Candle M.D.   On: 01/19/2024 08:02    Microbiology: Results for orders placed or performed during the hospital encounter of 01/19/24  MRSA Next Gen by PCR, Nasal     Status: None   Collection Time: 01/19/24 12:06 PM   Specimen: Nasal Mucosa; Nasal Swab  Result Value Ref Range Status   MRSA by PCR Next Gen NOT DETECTED NOT DETECTED Final    Comment: (NOTE) The GeneXpert MRSA Assay (FDA approved for NASAL specimens only), is one component of a comprehensive MRSA colonization surveillance program. It is not intended to diagnose MRSA infection nor to guide or monitor treatment for MRSA infections. Test performance is not FDA approved in patients less than 66 years old. Performed at [facility], [address], [place], [place] [address]     Labs: CBC: Recent Labs  Lab 01/19/24 0535 01/19/24 1130 01/19/24 2103 01/20/24 0227 01/20/24 2306  01/21/24 0241 01/22/24 0328  WBC 9.2  --   --  5.6 7.4 7.2 4.3  NEUTROABS 7.1  --   --   --   --   --   --   HGB 10.2*   < > 9.6* 8.5* 10.1* 9.5* 9.2*  HCT 31.3*   < > 29.6* 26.0* 30.3* 29.2* 27.6*  MCV 95.4  --   --  94.9 94.7 94.8 95.8  PLT 249  --   --  159 178 187 188   < > = values in this interval not displayed.   Basic Metabolic Panel: Recent Labs  Lab 01/19/24 0535 01/20/24 0227 01/20/24 2306 01/21/24 0241 01/22/24 0328  NA 139 142 138 139 140  K 3.9 3.5 3.7 3.4* 4.0  CL 108 115* 108 112* 112*  CO2 23 22 22  21* 22  GLUCOSE 133* 96 110* 103* 103*  BUN 48* 24* 17 18 17   CREATININE 1.13 0.67 0.82 0.80 0.90  CALCIUM  8.2* 7.7* 8.2* 8.0* 8.0*  MG  --   --  2.0  --   --    Liver Function Tests: Recent Labs  Lab 01/19/24 0535 01/20/24 0227 01/21/24 0241  AST 30 26 29   ALT 19 17 19   ALKPHOS 70 60 69  BILITOT 0.5 0.9 0.5  PROT 5.3* 4.9* 5.2*  ALBUMIN 2.7* 2.6* 2.7*   CBG: No results for input(s): GLUCAP in the last 168 hours.  Discharge time spent: less than 30 minutes.  Signed: Garnette Pelt, MD [facility] 01/22/2024

## 2024-01-23 ENCOUNTER — Telehealth: Payer: Self-pay

## 2024-01-23 NOTE — Transitions of Care (Post Inpatient/ED Visit) (Signed)
 01/23/2024  Name: Mario Huerta MRN: [id number] DOB: 05-Feb-1941  Today's TOC FU Call Status: Today's TOC FU Call Status:: Successful TOC FU Call Completed TOC FU Call Complete Date: 01/23/24 Patient's Name and Date of Birth confirmed.  Transition Care Management Follow-up Telephone Call Date of Discharge: 01/22/24 Discharge Facility: [facility]) Type of Discharge: Inpatient Admission Primary Inpatient Discharge Diagnosis:: GI bleed How have you been since you were released from the hospital?: Better Any questions or concerns?: No  Items Reviewed: Did you receive and understand the discharge instructions provided?: Yes Medications obtained,verified, and reconciled?: Yes (Medications Reviewed) Any new allergies since your discharge?: No Dietary orders reviewed?: Yes Do you have support at home?: No  Medications Reviewed Today: Medications Reviewed Today     Reviewed by Emmitt Pan, LPN (Licensed Practical Nurse) on 01/23/24 at [phone]  Med List Status: <None>   Medication Order Taking? Sig Documenting Provider Last Dose Status Informant  APPLE CIDER VINEGAR PO [id number] Yes Take by mouth. [provider]  Active Family Member, Pharmacy Records  aspirin EC 81 MG tablet [id number]  Take 81 mg by mouth daily.  Patient not taking: Reported on 01/23/2024   [provider]  Active Family Member, Pharmacy Records  finasteride  (PROSCAR ) 5 MG tablet [id number] Yes TAKE 1 TABLET(5 MG) BY MOUTH DAILY Plotnikov, Aleksei V, MD  Active Family Member, Pharmacy Records  Glucosamine-Chondroitin-MSM (TRIPLE FLEX PO) [id number] Yes Take 1 tablet by mouth daily. [provider]  Active Family Member, Pharmacy Records  lisinopril -hydrochlorothiazide  (ZESTORETIC ) 20-12.5 MG tablet [id number] Yes TAKE 1 TABLET BY MOUTH EVERY DAY Plotnikov, Aleksei V, MD  Active Family Member, Pharmacy Records  metoprolol  tartrate (LOPRESSOR ) 25 MG tablet [id number] Yes Take 0.5 tablets  (12.5 mg total) by mouth 2 (two) times daily. Cindy Garnette POUR, MD  Active   Multiple Vitamins-Minerals (CENTRUM SILVER 50+MEN) TABS [id number] Yes Take 1 tablet by mouth daily with breakfast. [provider]  Active Family Member, Pharmacy Records  Omega-3 Fatty Acids (FISH OIL) 1000 MG CAPS [id number] Yes Take 1,000 mg by mouth daily. [provider]  Active Family Member, Pharmacy Records  pantoprazole  (PROTONIX ) 40 MG tablet [id number] Yes Take 1 tablet (40 mg total) by mouth 2 (two) times daily. Cindy Garnette POUR, MD  Active   VITAMIN E PO [id number] Yes Take 1 capsule by mouth daily. [provider]  Active Family Member, Pharmacy Records           Med Note ROLENE EVERN PULLING A   Sat Jan 19, 2024  9:15 AM) Pt takes it every once in a while             Home Care and Equipment/Supplies: Were Home Health Services Ordered?: NA Any new equipment or medical supplies ordered?: NA  Functional Questionnaire: Do you need assistance with bathing/showering or dressing?: No Do you need assistance with meal preparation?: No Do you need assistance with eating?: No Do you have difficulty maintaining continence: No Do you need assistance with getting out of bed/getting out of a chair/moving?: No Do you have difficulty managing or taking your medications?: No  Follow up appointments reviewed: PCP Follow-up appointment confirmed?: Yes Date of PCP follow-up appointment?: 01/30/24 Follow-up Provider: [facility] Follow-up appointment confirmed?: Yes Date of Specialist follow-up appointment?: 02/18/24 Follow-Up Specialty Provider:: cardio Do you need transportation to your follow-up appointment?: No Do you understand care options if your condition(s) worsen?: Yes-patient verbalized understanding    SIGNATURE Pan Emmitt, LPN [facility] Nurse  Health Advisor Direct Dial [phone]

## 2024-01-30 ENCOUNTER — Encounter: Admitting: Internal Medicine

## 2024-01-30 ENCOUNTER — Inpatient Hospital Stay: Admitting: Internal Medicine

## 2024-02-07 ENCOUNTER — Encounter (HOSPITAL_COMMUNITY): Payer: Self-pay

## 2024-02-08 ENCOUNTER — Other Ambulatory Visit: Payer: Self-pay | Admitting: Internal Medicine

## 2024-02-08 ENCOUNTER — Other Ambulatory Visit (HOSPITAL_COMMUNITY): Payer: Self-pay

## 2024-02-08 MED ORDER — METOPROLOL TARTRATE 25 MG PO TABS
12.5000 mg | ORAL_TABLET | Freq: Two times a day (BID) | ORAL | 0 refills | Status: DC
  Filled 2024-02-08: qty 30, 30d supply, fill #0
  Filled ????-??-??: fill #0

## 2024-02-11 ENCOUNTER — Other Ambulatory Visit (HOSPITAL_COMMUNITY): Payer: Self-pay

## 2024-02-11 ENCOUNTER — Encounter (HOSPITAL_COMMUNITY): Payer: Self-pay

## 2024-02-12 ENCOUNTER — Other Ambulatory Visit (HOSPITAL_COMMUNITY): Payer: Self-pay

## 2024-02-13 ENCOUNTER — Other Ambulatory Visit (HOSPITAL_COMMUNITY): Payer: Self-pay

## 2024-02-15 NOTE — Progress Notes (Addendum)
 Cardiology Office Note:  .   Date:  02/18/2024  ID:  Mario Huerta, DOB 02-22-1941, MRN [id number] PCP: Garald Karlynn GAILS, MD  [name]:  Soyla DELENA Merck, MD {  History of Present Illness: .   Mario Huerta is a 83 y.o. male with history of heart failure with with recovered EF, CAD on CCTA, Barrett's esophagus with esophagitis, esophageal stricture, gastric ulcer, bladder neck obstruction, hypertension     CAD 01/2021 CCTA with severe CAD in the proximal ramus intermedius and moderate disease in the mid LAD negative by FFR. Did have distal LAD flow-limiting lesion.   Has deferred cardiac catheterization unless significant symptoms arise.  Statins make his legs numb and causes falls   Cardiomyopathy 01/2021 EF 45 to 50% with dyskinesis and hypokinesis of mid to apical lateral and anteroseptal walls. 04/2021 EF recovered-12/2023  GI bleed/new onset A-fib Admission 12/2023 status post 1 unit PRBC.  Felt to be diverticular bleed.  Had new onset A-fib with quick return to sinus rhythm.  Briefly on Cardizem  drip.  Social history  Never smoker Stays with his girlfriend and daughter occasionally. Never smoker, no drinking Wants to live to [employer] and likes to work on old cars     Patient with  CCTA with severe CAD in the proximal ramus intermedius and moderate disease in the mid LAD negative by FFR. Did have distal LAD flow-limiting lesion.  In the past he has had chronic symptoms of shortness of breath that seem disproportionate to his disease.  He had declined cardiac catheterization previously and also wary of statin therapy before due to leg pain/falls.  Last seen 08/2021 with stable symptoms.  He was recently admitted in the hospital with GI bleed felt secondary to diverticuli.  CT imaging showing no active bleed.  Had brief episode of A-fib overnight.  Anticoagulation deferred given GI bleed.  Heart monitor ordered but has not  resulted.  Today patient presents for hospital follow-up.  He is accompanied with his 2 daughters.  He reports that during the hospitalization he was completely unaware his arrhythmia.  Has not noted any complaints postdischarge and overall feels well.  No recurrences of blood in his stool.  Continues to deny any complaints of chest pain and able to complete all of his ADLs independently with the assistance of a cane.  Family reports he is very active and always doing different things.  Reports his chronic shortness of breath actually has been improved recently.  ROS: Denies: Chest pain, shortness of breath, orthopnea, peripheral edema, palpitations, decreased exercise intolerance, fatigue, lightheadedness.   Studies Reviewed: SABRA    EKG Interpretation Date/Time:  Monday February 18 2024 08:21:08 EDT Ventricular Rate:  60 PR Interval:  182 QRS Duration:  94 QT Interval:  416 QTC Calculation: 416 R Axis:   78  Text Interpretation: Normal sinus rhythm Normal ECG When compared with ECG of 20-Jan-2024 23:18, Sinus rhythm has replaced Atrial fibrillation Vent. rate has decreased BY  39 BPM T wave amplitude has increased in Anterior leads Confirmed by [name] [id number]) on 02/18/2024 8:28:45 AM    Risk Assessment/Calculations:    CHA2DS2-VASc Score = 5  This indicates a 7.2% annual risk of stroke. The patient's score is based upon: CHF History: 1 HTN History: 1 Diabetes History: 0 Stroke History: 0 Vascular Disease History: 1 Age Score: 2 Gender Score: 0       Physical Exam:   VS:  BP 114/82 (BP Location: Right  Arm, Patient Position: Sitting, Cuff Size: Normal)   Pulse 60   Ht 5' 10 (1.778 m)   Wt 197 lb (89.4 kg)   SpO2 94%   BMI 28.27 kg/m    Wt Readings from Last 3 Encounters:  02/18/24 197 lb (89.4 kg)  01/19/24 193 lb (87.5 kg)  01/07/24 193 lb (87.5 kg)    GEN: Well nourished, well developed in no acute distress NECK: No JVD; No carotid bruits CARDIAC: RRR, no murmurs,  rubs, gallops RESPIRATORY:  Clear to auscultation without rales, wheezing or rhonchi  ABDOMEN: Soft, non-tender, non-distended EXTREMITIES:  No edema; No deformity   ASSESSMENT AND PLAN: .    New onset A-fib Briefly noted overnight in the setting of GI bleed secondary to diverticuli.  Unaware of his arrhythmia.  Today he is in sinus rhythm. Heart monitor is still pending.  His CHA2DS2-VASc is 5 but he has multiple comorbid conditions that places him at higher bleeding risk with his recent GI bleed admission, Barrett's esophagus and gastric ulcer.  Episode of atrial fibrillation was fairly short and likely exacerbated by underlying GI bleed/anemia.   For now we will defer anticoagulation unless heart monitor shows evidence of this and he is stable from a GI perspective.  Family and patient agreeable. Will also get input from his primary cardiologist to weigh in.  Will await monitor results to help guide decision making. Chronically has had moderately dilated LA.  TSH normal during admission He is taking Lopressor  12.5 mg daily  CAD -01/2021 CCTA with severe CAD in the proximal ramus intermedius and moderate disease in the mid LAD negative by FFR. Did have distal LAD flow-limiting lesion.   Stable disease and very active with no exertional complaints.  Plan for continued medical management contingent upon symptoms. Deferring cardiac catheterization until symptoms warrant. Per GI okay to continue aspirin. Has not been on a statin due to leg numbness which exacerbated falls.  LDL last year was 24.  Will repeat lipid panel once he is fasted to help guide decision making if he needs other therapies.  Would be amenable to trying something else.  Heart failure with recovered EF -01/2021 EF 45 to 50% with dyskinesis and hypokinesis of mid to apical lateral and anteroseptal walls. -04/2021 EF recovered-12/2023 Remote issue.  Euvolemic, asymptomatic.  Hypertension Well-controlled. Continue with  lisinopril -HCTZ 20-12.5 mg daily  GI bleed Hemoglobin has been stable.  Denies any complaints/further bleeding.  Will check CBC just to be sure.  Will follow-up with GI next month.    Dispo: Follow-up in 1 year with Dr. Loni.  Monitor was shipped back Wednesday.  Will touch base with [name] and follow-up on results.  Addendum: MD agreeable to plan.   Signed, Thom LITTIE Sluder, PA-C

## 2024-02-18 ENCOUNTER — Encounter: Payer: Self-pay | Admitting: Physician Assistant

## 2024-02-18 ENCOUNTER — Ambulatory Visit: Attending: Physician Assistant | Admitting: Cardiology

## 2024-02-18 ENCOUNTER — Other Ambulatory Visit: Payer: Self-pay | Admitting: Internal Medicine

## 2024-02-18 VITALS — BP 114/82 | HR 60 | Ht 70.0 in | Wt 197.0 lb

## 2024-02-18 DIAGNOSIS — I10 Essential (primary) hypertension: Secondary | ICD-10-CM | POA: Diagnosis not present

## 2024-02-18 DIAGNOSIS — I5032 Chronic diastolic (congestive) heart failure: Secondary | ICD-10-CM | POA: Diagnosis not present

## 2024-02-18 DIAGNOSIS — I2510 Atherosclerotic heart disease of native coronary artery without angina pectoris: Secondary | ICD-10-CM | POA: Diagnosis not present

## 2024-02-18 DIAGNOSIS — I480 Paroxysmal atrial fibrillation: Secondary | ICD-10-CM | POA: Diagnosis not present

## 2024-02-18 LAB — CBC
Hematocrit: 35.1 % — ABNORMAL LOW (ref 37.5–51.0)
Hemoglobin: 11.1 g/dL — ABNORMAL LOW (ref 13.0–17.7)
MCH: 29.2 pg (ref 26.6–33.0)
MCHC: 31.6 g/dL (ref 31.5–35.7)
MCV: 92 fL (ref 79–97)
Platelets: 332 x10E3/uL (ref 150–450)
RBC: 3.8 x10E6/uL — ABNORMAL LOW (ref 4.14–5.80)
RDW: 13.1 % (ref 11.6–15.4)
WBC: 5.4 x10E3/uL (ref 3.4–10.8)

## 2024-02-18 NOTE — Patient Instructions (Addendum)
 Thank you for choosing [facility]!     Medication Instructions:  No medication changes were made during today's visit.  *If you need a refill on your cardiac medications before your next appointment, please call your pharmacy*   Lab Work: Labs  will be drawn today.................... CBC  Have your labs drawn in one month ..........................LIPIDS   If you have labs (blood work) drawn today and your tests are completely normal, you will receive your results only by: MyChart Message (if you have MyChart) OR A paper copy in the mail If you have any lab test that is abnormal or we need to change your treatment, we will call you to review the results.   Testing/Procedures: No procedures were ordered during today's visit.   Your next appointment:   1 year(s)   Provider:   Soyla DELENA Merck, MD     Follow-Up: At [facility], you and your health needs are our priority.  As part of our continuing mission to provide you with exceptional heart care, we have created designated Provider Care Teams.  These Care Teams include your primary Cardiologist (physician) and Advanced Practice Providers (APPs -  Physician Assistants and Nurse Practitioners) who all work together to provide you with the care you need, when you need it. We recommend signing up for the patient portal called MyChart.  Sign up information is provided on this After Visit Summary.  MyChart is used to connect with patients for Virtual Visits (Telemedicine).  Patients are able to view lab/test results, encounter notes, upcoming appointments, etc.  Non-urgent messages can be sent to your provider as well.   To learn more about what you can do with MyChart, go to [email or web address].

## 2024-02-19 ENCOUNTER — Ambulatory Visit: Payer: Self-pay | Admitting: Cardiology

## 2024-02-19 LAB — LIPID PANEL
Chol/HDL Ratio: 2.9 ratio (ref 0.0–5.0)
Cholesterol, Total: 144 mg/dL (ref 100–199)
HDL: 49 mg/dL (ref >39)
LDL Chol Calc (NIH): 84 mg/dL (ref 0–99)
Triglycerides: 49 mg/dL (ref 0–149)
VLDL Cholesterol Cal: 11 mg/dL (ref 5–40)

## 2024-02-21 DIAGNOSIS — I480 Paroxysmal atrial fibrillation: Secondary | ICD-10-CM | POA: Diagnosis not present

## 2024-03-06 NOTE — Progress Notes (Unsigned)
 Chief Complaint: Hospital follow-up for GI bleed  HPI:    [name] is an 83 year old male with a past medical history as listed below, signed to Dr. Shila at recent hospitalization, who was referred to me by Plotnikov, Karlynn GAILS, MD for follow-up after recent hospitalization for GI bleed.    01/19/2024 patient seen in the hospital by our service for rectal bleeding.  Described passage of mostly bloody stool.  Experiencing some lightheadedness and a recent bowel habit change with no unintentional weight loss.  At that time thought related to acute lower GI bleed likely diverticular.  Bleeding ceased spontaneously and CT angio negative for active extravasation.    01/20/2024 patient followed by her service and hemoglobin was 8.5 he had received 1 unit of PRBCs (10.2 on 01/19/2024).  Discussed that patient would benefit from a colonoscopy as an outpatient once acute issues had resolved to exclude any neoplastic lesion.    01/22/2024 our service signed off.  Hemoglobin had remained stable after unit PRBCs with a hemoglobin 9.2 on 01/22/2024.  CTA negative for active bleed.  Did show diverticulosis in the left colon.  Recommended considering colonoscopy as an outpatient.    02/18/2024 CBC with a hemoglobin of 11.1.  Past Medical History:  Diagnosis Date   Arthritis    Food impaction of esophagus    Gastric ulcer    Hypertension     Past Surgical History:  Procedure Laterality Date   BIOPSY  01/31/2018   Procedure: BIOPSY;  Surgeon: Saintclair Jasper, MD;  Location: [facility] ENDOSCOPY;  Service: Gastroenterology;;   ESOPHAGOGASTRODUODENOSCOPY N/A 01/31/2018   Procedure: ESOPHAGOGASTRODUODENOSCOPY (EGD);  Surgeon: Saintclair Jasper, MD;  Location: [facility] ENDOSCOPY;  Service: Gastroenterology;  Laterality: N/A;   FOREIGN BODY REMOVAL  01/31/2018   Procedure: FOREIGN BODY REMOVAL;  Surgeon: Saintclair Jasper, MD;  Location: [facility] ENDOSCOPY;  Service: Gastroenterology;;   HERNIA REPAIR     MOUTH SURGERY  2019   TOTAL HIP  ARTHROPLASTY Right     Current Outpatient Medications  Medication Sig Dispense Refill   APPLE CIDER VINEGAR PO Take by mouth.     aspirin EC 81 MG tablet Take 81 mg by mouth daily. (Patient not taking: Reported on 02/18/2024)     Cyanocobalamin  (VITAMIN B-12) 5000 MCG SUBL Place 5,000 mcg under the tongue daily.     finasteride  (PROSCAR ) 5 MG tablet TAKE 1 TABLET(5 MG) BY MOUTH DAILY 90 tablet 3   Glucosamine-Chondroitin-MSM (TRIPLE FLEX PO) Take 1 tablet by mouth daily.     lisinopril -hydrochlorothiazide  (ZESTORETIC ) 20-12.5 MG tablet TAKE 1 TABLET BY MOUTH EVERY DAY 90 tablet 1   metoprolol  tartrate (LOPRESSOR ) 25 MG tablet Take 0.5 tablets (12.5 mg total) by mouth 2 (two) times daily. 30 tablet 0   Multiple Vitamins-Minerals (CENTRUM SILVER 50+MEN) TABS Take 1 tablet by mouth daily with breakfast.     Omega-3 Fatty Acids (FISH OIL) 1000 MG CAPS Take 1,000 mg by mouth daily.     pantoprazole  (PROTONIX ) 40 MG tablet TAKE 1 TABLET(40 MG) BY MOUTH TWICE DAILY 60 tablet 11   VITAMIN E PO Take 1 capsule by mouth daily.     No current facility-administered medications for this visit.    Allergies as of 03/07/2024 - Review Complete 02/18/2024  Allergen Reaction Noted   Codeine Itching and Nausea And Vomiting 01/31/2018   Gabapentin  Swelling and Other (See Comments) 01/24/2021   Hydrocodone Nausea And Vomiting 01/31/2018   Oxycodone Nausea And Vomiting 01/31/2018   Tramadol Itching and Nausea And  Vomiting 01/31/2018    Family History  Problem Relation Age of Onset   Colon cancer Neg Hx    Esophageal cancer Neg Hx    Pancreatic cancer Neg Hx    Stomach cancer Neg Hx    Liver disease Neg Hx     Social History   Socioeconomic History   Marital status: Widowed    Spouse name: Not on file   Number of children: Not on file   Years of education: Not on file   Highest education level: Some college, no degree  Occupational History   Not on file  Tobacco Use   Smoking status: Never    Smokeless tobacco: Never  Vaping Use   Vaping status: Never Used  Substance and Sexual Activity   Alcohol  use: Yes    Comment: occasional   Drug use: Never   Sexual activity: Not Currently    Partners: Female  Other Topics Concern   Not on file  Social History Narrative   Right handed   One story home   Drinks caffeine coffee qam   Social Drivers of Health   Financial Resource Strain: Low Risk  (01/07/2024)   Overall Financial Resource Strain (CARDIA)    Difficulty of Paying Living Expenses: Not hard at all  Food Insecurity: No Food Insecurity (01/20/2024)   Hunger Vital Sign    Worried About Running Out of Food in the Last Year: Never true    Ran Out of Food in the Last Year: Never true  Transportation Needs: No Transportation Needs (01/20/2024)   PRAPARE - Administrator, Civil Service (Medical): No    Lack of Transportation (Non-Medical): No  Physical Activity: Sufficiently Active (01/07/2024)   Exercise Vital Sign    Days of Exercise per Week: 5 days    Minutes of Exercise per Session: 40 min  Stress: No Stress Concern Present (01/07/2024)   [employer] of Occupational Health - Occupational Stress Questionnaire    Feeling of Stress: Not at all  Social Connections: Moderately Isolated (01/19/2024)   Social Connection and Isolation Panel    Frequency of Communication with Friends and Family: More than three times a week    Frequency of Social Gatherings with Friends and Family: Twice a week    Attends Religious Services: Never    Database administrator or Organizations: Yes    Attends Banker Meetings: 1 to 4 times per year    Marital Status: Widowed  Intimate Partner Violence: Not At Risk (01/20/2024)   Humiliation, Afraid, Rape, and Kick questionnaire    Fear of Current or Ex-Partner: No    Emotionally Abused: No    Physically Abused: No    Sexually Abused: No    Review of Systems:    Constitutional: No weight loss, fever, chills,  weakness or fatigue HEENT: Eyes: No change in vision               Ears, Nose, Throat:  No change in hearing or congestion Skin: No rash or itching Cardiovascular: No chest pain, chest pressure or palpitations   Respiratory: No SOB or cough Gastrointestinal: See HPI and otherwise negative Genitourinary: No dysuria or change in urinary frequency Neurological: No headache, dizziness or syncope Musculoskeletal: No new muscle or joint pain Hematologic: No bleeding or bruising Psychiatric: No history of depression or anxiety    Physical Exam:  Vital signs: There were no vitals taken for this visit.  Constitutional:   Pleasant Caucasian male appears  to be in NAD, Well developed, Well nourished, alert and cooperative Head:  Normocephalic and atraumatic. Eyes:   PEERL, EOMI. No icterus. Conjunctiva pink. Ears:  Normal auditory acuity. Neck:  Supple Throat: Oral cavity and pharynx without inflammation, swelling or lesion.  Respiratory: Respirations even and unlabored. Lungs clear to auscultation bilaterally.   No wheezes, crackles, or rhonchi.  Cardiovascular: Normal S1, S2. No MRG. Regular rate and rhythm. No peripheral edema, cyanosis or pallor.  Gastrointestinal:  Soft, nondistended, nontender. No rebound or guarding. Normal bowel sounds. No appreciable masses or hepatomegaly. Rectal:  Not performed.  Msk:  Symmetrical without gross deformities. Without edema, no deformity or joint abnormality.  Neurologic:  Alert and  oriented x4;  grossly normal neurologically.  Skin:   Dry and intact without significant lesions or rashes. Psychiatric: Oriented to person, place and time. Demonstrates good judgement and reason without abnormal affect or behaviors.  RELEVANT LABS AND IMAGING: CBC    Component Value Date/Time   WBC 5.4 02/18/2024 0919   WBC 4.3 01/22/2024 0328   RBC 3.80 (L) 02/18/2024 0919   RBC 2.88 (L) 01/22/2024 0328   HGB 11.1 (L) 02/18/2024 0919   HCT 35.1 (L) 02/18/2024  0919   PLT 332 02/18/2024 0919   MCV 92 02/18/2024 0919   MCH 29.2 02/18/2024 0919   MCH 31.9 01/22/2024 0328   MCHC 31.6 02/18/2024 0919   MCHC 33.3 01/22/2024 0328   RDW 13.1 02/18/2024 0919   LYMPHSABS 1.4 01/19/2024 0535   MONOABS 0.5 01/19/2024 0535   EOSABS 0.1 01/19/2024 0535   BASOSABS 0.0 01/19/2024 0535    CMP     Component Value Date/Time   NA 140 01/22/2024 0328   NA 141 05/13/2021 0858   K 4.0 01/22/2024 0328   CL 112 (H) 01/22/2024 0328   CO2 22 01/22/2024 0328   GLUCOSE 103 (H) 01/22/2024 0328   BUN 17 01/22/2024 0328   BUN 13 05/13/2021 0858   CREATININE 0.90 01/22/2024 0328   CALCIUM  8.0 (L) 01/22/2024 0328   PROT 5.2 (L) 01/21/2024 0241   PROT 6.1 05/13/2021 0858   ALBUMIN 2.7 (L) 01/21/2024 0241   ALBUMIN 4.0 05/13/2021 0858   AST 29 01/21/2024 0241   ALT 19 01/21/2024 0241   ALKPHOS 69 01/21/2024 0241   BILITOT 0.5 01/21/2024 0241   BILITOT 0.8 05/13/2021 0858   GFRNONAA >60 01/22/2024 0328   GFRAA >60 02/12/2018 1400    Assessment: 1.  History of GI bleed: Initially presented to the hospital 01/19/2024 with negative CTA but did show diverticulosis, bleeding resolved and hemoglobin stabilized after 1 unit of PRBCs, recommended to consider colonoscopy outpatient, CBC 02/18/2024 with a hemoglobin of 11.1 2.  Acute blood loss anemia: Secondary to above 3.  GERD with Barrett's esophagus and esophageal stricture asymptomatic: Maintained on Pantoprazole  40 twice daily 4.  CAD: With LVEF 60-65% on echo 01/21/2024 5.  New onset A-fib: on 8/24-8/25, briefly on Diltiazem  infusion and converted back to normal sinus rhythm  Plan: 1. ***     Delon Failing, PA-C [facility] 03/06/2024, 2:13 PM  Cc: Plotnikov, Aleksei V, MD

## 2024-03-07 ENCOUNTER — Ambulatory Visit (INDEPENDENT_AMBULATORY_CARE_PROVIDER_SITE_OTHER): Admitting: Physician Assistant

## 2024-03-07 ENCOUNTER — Encounter: Payer: Self-pay | Admitting: Physician Assistant

## 2024-03-07 VITALS — BP 126/68 | HR 64 | Ht 70.0 in | Wt 201.0 lb

## 2024-03-07 DIAGNOSIS — K2270 Barrett's esophagus without dysplasia: Secondary | ICD-10-CM | POA: Diagnosis not present

## 2024-03-07 DIAGNOSIS — Z8719 Personal history of other diseases of the digestive system: Secondary | ICD-10-CM | POA: Diagnosis not present

## 2024-03-07 DIAGNOSIS — K219 Gastro-esophageal reflux disease without esophagitis: Secondary | ICD-10-CM | POA: Diagnosis not present

## 2024-03-07 DIAGNOSIS — D62 Acute posthemorrhagic anemia: Secondary | ICD-10-CM | POA: Diagnosis not present

## 2024-03-10 ENCOUNTER — Other Ambulatory Visit: Payer: Self-pay | Admitting: Internal Medicine

## 2024-03-14 ENCOUNTER — Telehealth: Payer: Self-pay | Admitting: Internal Medicine

## 2024-03-14 MED ORDER — METOPROLOL TARTRATE 25 MG PO TABS: 12.5000 mg | ORAL_TABLET | Freq: Two times a day (BID) | ORAL | 2 refills | Status: AC

## 2024-03-14 NOTE — Telephone Encounter (Signed)
 Refill sent.

## 2024-03-14 NOTE — Telephone Encounter (Signed)
*  STAT* If patient is at the pharmacy, call can be transferred to refill team.   1. Which medications need to be refilled? (please list name of each medication and dose if known)   metoprolol  tartrate (LOPRESSOR ) 25 MG tablet (Expired)    2. Which pharmacy/location (including street and city if local pharmacy) is medication to be sent to?  [id number] - [place], [place] - [address] AT [address]     3. Do they need a 30 day or 90 day supply? 90 day

## 2024-03-17 DIAGNOSIS — I480 Paroxysmal atrial fibrillation: Secondary | ICD-10-CM

## 2024-03-19 ENCOUNTER — Other Ambulatory Visit: Payer: Self-pay | Admitting: Internal Medicine

## 2024-03-20 ENCOUNTER — Ambulatory Visit: Payer: Self-pay | Admitting: Cardiology

## 2024-03-21 NOTE — Telephone Encounter (Signed)
-----   Message from Thom LITTIE Sluder sent at 03/20/2024  2:00 PM EDT ----- No evidence of atrial fibrillation which supports that recent occurrence likely related to underlying GI bleeding.  Collectively we have agreed no anticoagulation for now.  Monitor with predominantly  sinus rhythm.  2 beats of VT/SVT.  With rare ectopy.  No concerns.  He triggered events for sinus rhythm and ectopy. ----- Message ----- From: Loni Soyla LABOR, MD Sent: 03/17/2024   5:46 PM EDT To: Thom LITTIE Sluder, PA-C

## 2024-03-21 NOTE — Telephone Encounter (Signed)
 Lvm for patient to call back for results.

## 2024-04-16 ENCOUNTER — Encounter: Payer: Self-pay | Admitting: Internal Medicine

## 2024-04-16 ENCOUNTER — Ambulatory Visit: Admitting: Internal Medicine

## 2024-04-16 VITALS — BP 160/84 | HR 69 | Temp 98.0°F | Ht 70.0 in | Wt 200.0 lb

## 2024-04-16 DIAGNOSIS — K2270 Barrett's esophagus without dysplasia: Secondary | ICD-10-CM | POA: Diagnosis not present

## 2024-04-16 DIAGNOSIS — G8929 Other chronic pain: Secondary | ICD-10-CM

## 2024-04-16 DIAGNOSIS — I10 Essential (primary) hypertension: Secondary | ICD-10-CM

## 2024-04-16 DIAGNOSIS — I2583 Coronary atherosclerosis due to lipid rich plaque: Secondary | ICD-10-CM | POA: Diagnosis not present

## 2024-04-16 DIAGNOSIS — R269 Unspecified abnormalities of gait and mobility: Secondary | ICD-10-CM | POA: Diagnosis not present

## 2024-04-16 DIAGNOSIS — K209 Esophagitis, unspecified without bleeding: Secondary | ICD-10-CM

## 2024-04-16 DIAGNOSIS — I2510 Atherosclerotic heart disease of native coronary artery without angina pectoris: Secondary | ICD-10-CM | POA: Diagnosis not present

## 2024-04-16 LAB — COMPREHENSIVE METABOLIC PANEL WITH GFR
ALT: 18 U/L (ref 0–53)
AST: 30 U/L (ref 0–37)
Albumin: 4.1 g/dL (ref 3.5–5.2)
Alkaline Phosphatase: 87 U/L (ref 39–117)
BUN: 15 mg/dL (ref 6–23)
CO2: 26 meq/L (ref 19–32)
Calcium: 8.8 mg/dL (ref 8.4–10.5)
Chloride: 105 meq/L (ref 96–112)
Creatinine, Ser: 0.93 mg/dL (ref 0.40–1.50)
GFR: 75.36 mL/min (ref >60.00)
Glucose, Bld: 66 mg/dL — ABNORMAL LOW (ref 70–99)
Potassium: 3.8 meq/L (ref 3.5–5.1)
Sodium: 139 meq/L (ref 135–145)
Total Bilirubin: 0.7 mg/dL (ref 0.2–1.2)
Total Protein: 7.1 g/dL (ref 6.0–8.3)

## 2024-04-16 LAB — CBC WITH DIFFERENTIAL/PLATELET
Basophils Absolute: 0.1 K/uL (ref 0.0–0.1)
Basophils Relative: 0.5 % (ref 0.0–3.0)
Eosinophils Absolute: 0 K/uL (ref 0.0–0.7)
Eosinophils Relative: 0.4 % (ref 0.0–5.0)
HCT: 36.5 % — ABNORMAL LOW (ref 39.0–52.0)
Hemoglobin: 11.9 g/dL — ABNORMAL LOW (ref 13.0–17.0)
Lymphocytes Relative: 9.4 % — ABNORMAL LOW (ref 12.0–46.0)
Lymphs Abs: 1.2 K/uL (ref 0.7–4.0)
MCHC: 32.7 g/dL (ref 30.0–36.0)
MCV: 80.7 fl (ref 78.0–100.0)
Monocytes Absolute: 0.9 K/uL (ref 0.1–1.0)
Monocytes Relative: 7.6 % (ref 3.0–12.0)
Neutro Abs: 10.1 K/uL — ABNORMAL HIGH (ref 1.4–7.7)
Neutrophils Relative %: 82.1 % — ABNORMAL HIGH (ref 43.0–77.0)
Platelets: 303 K/uL (ref 150.0–400.0)
RBC: 4.52 Mil/uL (ref 4.22–5.81)
RDW: 16.4 % — ABNORMAL HIGH (ref 11.5–15.5)
WBC: 12.3 K/uL — ABNORMAL HIGH (ref 4.0–10.5)

## 2024-04-16 LAB — TSH: TSH: 1.57 u[IU]/mL (ref 0.35–5.50)

## 2024-04-16 MED ORDER — AMLODIPINE BESYLATE 5 MG PO TABS: 5.0000 mg | ORAL_TABLET | Freq: Every day | ORAL | 3 refills | Status: AC

## 2024-04-16 NOTE — Assessment & Plan Note (Signed)
 Dr Annamaria Helling Off Crestor - much better CT angio IMPRESSION: 1. CT FFR analysis showed hemodynamically significant stenosis in the mid LAD or the proximal ramus intermedius.   2. 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 The vessel is small caliber in this region.    Statin intolerant

## 2024-04-16 NOTE — Assessment & Plan Note (Signed)
 Resolved after Chiropractic treatment. Doing well Leg weakness - much better off statins

## 2024-04-16 NOTE — Assessment & Plan Note (Signed)
 High BP Mario Huerta stopped his BP meds 2 wks ago Cont w/Metoprolol  Re-start Norvasc 

## 2024-04-16 NOTE — Assessment & Plan Note (Signed)
 Off Crestor - much better

## 2024-04-16 NOTE — Assessment & Plan Note (Signed)
 Cont on Protonix 80 mg/d

## 2024-04-16 NOTE — Progress Notes (Signed)
 Subjective:  Patient ID: Mario Huerta, male    DOB: 1940-10-17  Age: 83 y.o. MRN: [id number]  CC: Hypertension   HPI Mario Huerta presents for high BP off Norvasc  (stopped in Aug 2025 when he had lower GI bleed. F/u A fib, HTN. He is here w/Kelli. SBP 150-160 at home  Outpatient Medications Prior to Visit  Medication Sig Dispense Refill   APPLE CIDER VINEGAR PO Take by mouth.     aspirin EC 81 MG tablet Take 81 mg by mouth daily.     Cyanocobalamin  (VITAMIN B-12) 5000 MCG SUBL Place 5,000 mcg under the tongue daily.     finasteride  (PROSCAR ) 5 MG tablet TAKE 1 TABLET(5 MG) BY MOUTH DAILY 90 tablet 3   Glucosamine-Chondroitin-MSM (TRIPLE FLEX PO) Take 1 tablet by mouth daily.     lisinopril -hydrochlorothiazide  (ZESTORETIC ) 20-12.5 MG tablet TAKE 1 TABLET BY MOUTH EVERY DAY 90 tablet 1   metoprolol  tartrate (LOPRESSOR ) 25 MG tablet Take 0.5 tablets (12.5 mg total) by mouth 2 (two) times daily. 45 tablet 2   Multiple Vitamins-Minerals (CENTRUM SILVER 50+MEN) TABS Take 1 tablet by mouth daily with breakfast.     Omega-3 Fatty Acids (FISH OIL) 1000 MG CAPS Take 1,000 mg by mouth daily.     pantoprazole  (PROTONIX ) 40 MG tablet TAKE 1 TABLET(40 MG) BY MOUTH TWICE DAILY 60 tablet 11   VITAMIN E PO Take 1 capsule by mouth daily.     No facility-administered medications prior to visit.    ROS: Review of Systems  Constitutional:  Negative for appetite change, fatigue and unexpected weight change.  HENT:  Negative for congestion, nosebleeds, sneezing, sore throat and trouble swallowing.   Eyes:  Negative for itching and visual disturbance.  Respiratory:  Negative for cough.   Cardiovascular:  Positive for palpitations. Negative for chest pain and leg swelling.  Gastrointestinal:  Negative for abdominal distention, blood in stool, diarrhea and nausea.  Genitourinary:  Negative for frequency and hematuria.  Musculoskeletal:  Positive for arthralgias, back pain and gait problem. Negative  for joint swelling and neck pain.  Skin:  Negative for rash.  Neurological:  Negative for dizziness, tremors, speech difficulty and weakness.  Psychiatric/Behavioral:  Negative for agitation, dysphoric mood, sleep disturbance and suicidal ideas. The patient is not nervous/anxious.     Objective:  BP (!) 160/84   Pulse 69   Temp 98 F (36.7 C)   Ht 5' 10 (1.778 m)   Wt 200 lb (90.7 kg)   SpO2 97%   BMI 28.70 kg/m   BP Readings from Last 3 Encounters:  04/16/24 (!) 160/84  03/07/24 126/68  02/18/24 114/82    Wt Readings from Last 3 Encounters:  04/16/24 200 lb (90.7 kg)  03/07/24 201 lb (91.2 kg)  02/18/24 197 lb (89.4 kg)    Physical Exam Constitutional:      General: He is not in acute distress.    Appearance: Normal appearance. He is well-developed.     Comments: NAD  Eyes:     Conjunctiva/sclera: Conjunctivae normal.     Pupils: Pupils are equal, round, and reactive to light.  Neck:     Thyroid : No thyromegaly.     Vascular: No JVD.  Cardiovascular:     Rate and Rhythm: Normal rate and regular rhythm.     Heart sounds: Normal heart sounds. No murmur heard.    No friction rub. No gallop.  Pulmonary:     Effort: Pulmonary effort is normal. No respiratory  distress.     Breath sounds: Normal breath sounds. No wheezing or rales.  Chest:     Chest wall: No tenderness.  Abdominal:     General: Bowel sounds are normal. There is no distension.     Palpations: Abdomen is soft. There is no mass.     Tenderness: There is no abdominal tenderness. There is no guarding or rebound.  Musculoskeletal:        General: No tenderness. Normal range of motion.     Cervical back: Normal range of motion.  Lymphadenopathy:     Cervical: No cervical adenopathy.  Skin:    General: Skin is warm and dry.     Findings: No rash.  Neurological:     Mental Status: He is alert and oriented to person, place, and time.     Cranial Nerves: No cranial nerve deficit.     Motor: No abnormal  muscle tone.     Coordination: Coordination normal.     Gait: Gait normal.     Deep Tendon Reflexes: Reflexes are normal and symmetric.  Psychiatric:        Behavior: Behavior normal.        Thought Content: Thought content normal.        Judgment: Judgment normal.   Using a stick, stooping forward, stronger legs  Lab Results  Component Value Date   WBC 5.4 02/18/2024   HGB 11.1 (L) 02/18/2024   HCT 35.1 (L) 02/18/2024   PLT 332 02/18/2024   GLUCOSE 103 (H) 01/22/2024   CHOL 144 02/18/2024   TRIG 49 02/18/2024   HDL 49 02/18/2024   LDLCALC 84 02/18/2024   ALT 19 01/21/2024   AST 29 01/21/2024   NA 140 01/22/2024   K 4.0 01/22/2024   CL 112 (H) 01/22/2024   CREATININE 0.90 01/22/2024   BUN 17 01/22/2024   CO2 22 01/22/2024   TSH 2.442 01/21/2024   PSA 1.44 11/08/2022   INR 1.2 01/19/2024    CT ANGIO GI BLEED Result Date: 01/19/2024 CLINICAL DATA:  Hypotension. Rectal bleeding. Nausea dizziness and generalized weakness. EXAM: CTA ABDOMEN AND PELVIS WITHOUT AND WITH CONTRAST TECHNIQUE: Multidetector CT imaging of the abdomen and pelvis was performed using the standard protocol during bolus administration of intravenous contrast. Multiplanar reconstructed images and MIPs were obtained and reviewed to evaluate the vascular anatomy. RADIATION DOSE REDUCTION: This exam was performed according to the departmental dose-optimization program which includes automated exposure control, adjustment of the mA and/or kV according to patient size and/or use of iterative reconstruction technique. CONTRAST:  OMNIPAQUE  IOHEXOL  350 MG/ML SOLN COMPARISON:  None Available. FINDINGS: VASCULAR Aorta: Moderate atherosclerotic wall calcification.Normal caliber aorta without aneurysm, dissection, vasculitis or significant stenosis. Celiac: Patent without evidence of aneurysm, dissection, vasculitis or significant stenosis. SMA: Patent without evidence of aneurysm, dissection, vasculitis or significant  stenosis. Renals: Both renal arteries are patent without evidence of aneurysm, dissection, vasculitis, fibromuscular dysplasia or significant stenosis. Accessory right renal artery. IMA: Patent without evidence of aneurysm, dissection, vasculitis or significant stenosis.1 Inflow: Patent without evidence of aneurysm, dissection, vasculitis or significant stenosis. Proximal Outflow: Bilateral common femoral and visualized portions of the superficial and profunda femoral arteries are patent without evidence of aneurysm, dissection, vasculitis or significant stenosis. Veins: No obvious venous abnormality within the limitations of this arterial phase study. Review of the MIP images confirms the above findings. NON-VASCULAR Lower chest: Dependent atelectasis noted right lower lobe. Hiatal hernia contains mainly fat. Hepatobiliary: A tiny hypodensity in the  liver parenchyma is too small to characterize but is statistically most likely benign. No followup imaging is recommended. No suspicious focal abnormality within the liver parenchyma. There is no evidence for gallstones, gallbladder wall thickening, or pericholecystic fluid. No intrahepatic or extrahepatic biliary dilation. Pancreas: No focal mass lesion. No dilatation of the main duct. No intraparenchymal cyst. No peripancreatic edema. Spleen: No splenomegaly. No suspicious focal mass lesion. Adrenals/Urinary Tract: No adrenal nodule or mass. Tiny well-defined homogeneous low-density lesions in both kidneys are too small to characterize but are statistically most likely benign and probably cysts. No followup imaging is recommended. Mild fullness noted in both intrarenal collecting systems. No hydroureter. The urinary bladder appears normal for the degree of distention. Stomach/Bowel: No evidence for contrast extravasation into the gastric lumen to suggest active hemorrhage. No findings to suggest vascular lesion. Duodenum is normally positioned as is the ligament of  Treitz. No contrast extravasation in the duodenal lumen. No small bowel wall thickening. No small bowel dilatation. No intraluminal contrast extravasation in the small bowel loops. Mobile cecum is positioned in the left lower abdomen. The terminal ileum is normal. The appendix is not well visualized, but there is no edema or inflammation in the region of the cecal tip to suggest appendicitis. No definite evidence for contrast extravasation into the colon lumen to suggest active or ongoing bleeding. There is a focus of high attenuation noted in the splenic flexure (axial 38/15) but this is in a region of substantial motion artifact and no accumulation of contrast material in this region on more delayed imaging to suggest ongoing hemorrhage. Diverticular changes are noted in the left colon without evidence of diverticulitis. Lymphatic: There is no gastrohepatic or hepatoduodenal ligament lymphadenopathy. No retroperitoneal or mesenteric lymphadenopathy. No pelvic sidewall lymphadenopathy. Reproductive: The prostate gland and seminal vesicles are unremarkable. Other: No intraperitoneal free fluid. Musculoskeletal: Right hip replacement1. No worrisome lytic or sclerotic osseous abnormality. IMPRESSION: 1. No definite evidence for contrast extravasation into the gastric lumen, duodenum, small bowel, or colon lumen to suggest active or ongoing bleeding. 2. No acute findings in the abdomen or pelvis. 3. Left colonic diverticulosis without diverticulitis. 4.  Aortic Atherosclerosis (ICD10-I70.0). Electronically Signed   By: Camellia Candle M.D.   On: 01/19/2024 08:02    Assessment & Plan:   Problem List Items Addressed This Visit     Barrett's esophagus with esophagitis   Cont on Protonix  80 mg/d      CAD (coronary artery disease)   Dr Regino Off Crestor  - much better CT angio IMPRESSION: 1. CT FFR analysis showed hemodynamically significant stenosis in the mid LAD or the proximal ramus intermedius.   2. CT  FFR shows hemodynamically significant stenosis in the distal most LAD, in the region of the noted moderate stenosis. The vessel is small caliber in this region.    Statin intolerant      Relevant Medications   amLODipine  (NORVASC ) 5 MG tablet   Other Relevant Orders   Iron, TIBC and Ferritin Panel   Comprehensive metabolic panel with GFR   CBC with Differential/Platelet   TSH   Essential hypertension - Primary   High BP Tommy stopped his BP meds 2 wks ago Cont w/Metoprolol  Re-start Norvasc       Relevant Medications   amLODipine  (NORVASC ) 5 MG tablet   Other Relevant Orders   Iron, TIBC and Ferritin Panel   Comprehensive metabolic panel with GFR   CBC with Differential/Platelet   TSH   Gait disorder   Off  Crestor  - much better      Relevant Orders   Iron, TIBC and Ferritin Panel   Comprehensive metabolic panel with GFR   CBC with Differential/Platelet   TSH   Low back pain   Resolved after Chiropractic treatment. Doing well Leg weakness - much better off statins         Meds ordered this encounter  Medications   amLODipine  (NORVASC ) 5 MG tablet    Sig: Take 1 tablet (5 mg total) by mouth daily.    Dispense:  90 tablet    Refill:  3      Follow-up: Return in about 4 months (around 08/14/2024) for a follow-up visit.  Marolyn Noel, MD

## 2024-04-17 LAB — IRON,TIBC AND FERRITIN PANEL
%SAT: 17 % — ABNORMAL LOW (ref 20–48)
Ferritin: 16 ng/mL — ABNORMAL LOW (ref 24–380)
Iron: 48 ug/dL — ABNORMAL LOW (ref 50–180)
TIBC: 289 ug/dL (ref 250–425)

## 2024-04-21 ENCOUNTER — Ambulatory Visit: Payer: Self-pay | Admitting: Internal Medicine

## 2024-05-11 ENCOUNTER — Other Ambulatory Visit: Payer: Self-pay | Admitting: Internal Medicine

## 2024-05-27 ENCOUNTER — Telehealth: Payer: Self-pay | Admitting: Internal Medicine

## 2024-05-27 MED ORDER — LISINOPRIL-HYDROCHLOROTHIAZIDE 20-12.5 MG PO TABS: 1.0000 | ORAL_TABLET | Freq: Every day | ORAL | 1 refills | Status: AC

## 2024-05-27 NOTE — Telephone Encounter (Signed)
 Copied from CRM [id number]. Topic: Clinical - Medication Refill >> May 27, 2024 12:55 PM Viola F wrote: Medication: lisinopril -hydrochlorothiazide  (ZESTORETIC ) 20-12.5 MG tablet [[id number]]  Has the patient contacted their pharmacy? Yes (Agent: If no, request that the patient contact the pharmacy for the refill. If patient does not wish to contact the pharmacy document the reason why and proceed with request.) (Agent: If yes, when and what did the pharmacy advise?)  This is the patient's preferred pharmacy:  [id number] [place], [place] - [address] AT [facility] [address] [place] [address] Phone: [phone] Fax: [phone]  Is this the correct pharmacy for this prescription? Yes If no, delete pharmacy and type the correct one.   Has the prescription been filled recently? Yes  Is the patient out of the medication? No  Has the patient been seen for an appointment in the last year OR does the patient have an upcoming appointment? Yes  Can we respond through MyChart? Yes  Agent: Please be advised that Rx refills may take up to 3 business days. We ask that you follow-up with your pharmacy.

## 2025-01-07 ENCOUNTER — Ambulatory Visit
